# Patient Record
Sex: Male | Born: 1965 | Race: Black or African American | Hispanic: No | Marital: Single
Health system: Midwestern US, Community
[De-identification: ages and names within clinical notes are randomized; demographics above are authoritative.]

## PROBLEM LIST (undated history)

## (undated) DIAGNOSIS — N529 Male erectile dysfunction, unspecified: Secondary | ICD-10-CM

## (undated) DIAGNOSIS — Z720 Tobacco use: Secondary | ICD-10-CM

## (undated) DIAGNOSIS — J4520 Mild intermittent asthma, uncomplicated: Secondary | ICD-10-CM

## (undated) DIAGNOSIS — J45909 Unspecified asthma, uncomplicated: Secondary | ICD-10-CM

## (undated) DIAGNOSIS — I639 Cerebral infarction, unspecified: Secondary | ICD-10-CM

## (undated) HISTORY — PX: CARDIAC CATHETERIZATION: SHX172

---

## 2014-09-30 ENCOUNTER — Inpatient Hospital Stay: Admit: 2014-09-30 | Discharge: 2014-09-30 | Disposition: A | Discharge status: Home / Self Care

## 2014-09-30 NOTE — ED Provider Notes (Signed)
PATIENT:          Stephen Riley, Stephen Riley       DOS:           09/30/2014  MR #:             [id number]             ACCOUNT #:     [id number]  DATE OF BIRTH:    [birth year]              AGE:           49      HISTORY OF PRESENT ILLNESS:    PERTINENT HISTORY OF PRESENT  ILLNESS. Patient was seen with Dr.  Tasia Catchings.  Patient presents to the Emergency Department with complaint of right  upper  dental and facial pain.  Symptoms began 2 days ago progressively  worse.  This  feels like when he had a dental infection of the left side that  required  removal of his wisdom teeth area that was over a year ago and he has  not had  pain in the right upper teeth before.  He took Aleve last night with  very  minimal relief.  States he has pain with chewing.  He denies any  fevers,  chills, sweats.  Has not noticed any drainage from the site.        PHYSICAL EXAM Well-developed, well-nourished adult male.  He is  afebrile.  No  appreciable facial edema.  Tympanic membranes clear bilaterally.  ENT  exam  shows focal decay and erythema at the right maxillary third molar.  He  is  tender to palpation over the tooth.  There is erythema of the gumline  but no  appreciable dental abscess.  Neck is supple and nontender without  lymphadenopathy or meningismus.  Cardiac exam shows regular rate and  rhythm  without murmurs.  Lungs clear to auscultation.  Remainder of exam is  unremarkable other than noted above.    MEDICAL DECISION MAKING:    SIGNIFICANT FINDINGS/ED COURSE/MEDICAL DECISION MAKING/TREATMENT  PLAN Patient  is advised he appears to have periapical dental abscess secondary to  dental  caries.  Does not appear amenable to drainage at this time.  He is  given  Pen-Vee K as well as Dolobid.  He is given dental referral list and  encouraged  to call for follow-up as soon as possible.  He is instructed on signs  and  symptoms for return to the Emergency Department.  Discharged home with  pen VK  and naproxen.    PROBLEM LIST:       ED  Diagnosis:     Periapical abscess (K04.7): Entered Date: 30-Sep-2014 02:03,  Entered By:  Tressie Ellis, Status: Active, ICD-10: K20.7       Admit Reason:     tooth pain/head pain: Entered Date: 30-Sep-2014 01:32, Entered By:  Standley Brooking, Status: Active        ADDITIONAL INFORMATION If the physician assistant/nurse practitioner  was  involved in patient care, I personally performed and participated in  all the  above services (including HPI and PE). I have reviewed with the  physician  assistant/nurse practitioner the history and confirmed the findings  with the  patient. I personally performed all surgical procedures in the medical  record  unless otherwise indicated.      COPIES SENT TO::     JOPPERI, ANDREA(PCP): [id number]    Electronic Signatures:  AHMED, RAMI (DO)  (  Signed 08-Oct-2014 12:05)   Co-Signer: HISTORY OF PRESENT ILLNESS, PHYSICAL EXAM, MEDICAL  DECISION MAKING,  PROBLEM LIST, Additional Infomation, Copies to be sent to:  Tressie Ellis (PA)  (Signed 30-Sep-2014 02:04)   Authored: HISTORY OF PRESENT ILLNESS, PHYSICAL EXAM, MEDICAL DECISION  MAKING,  PROBLEM LIST, Additional Infomation, Copies to be sent to:      Last Updated: 08-Oct-2014 12:05 by AHMED, RAMI (DO)            Please see T-Sheet, initial assessment, and physician orders for  further details.    Dictating Physician: Tressie Ellis, PA  Original Electronic Signature Date: 09/30/2014 02:04 A  JS  Document #: [id number]    cc:  Dema Severin, DO       [address]       [address]       [place] [address]

## 2014-11-07 ENCOUNTER — Inpatient Hospital Stay: Admit: 2014-11-07 | Discharge: 2014-11-07 | Disposition: A | Discharge status: Home / Self Care

## 2014-11-07 LAB — CBC
Hematocrit: 41.8 % (ref 40.0–52.0)
Hemoglobin: 13.6 g/dL (ref 13.0–18.0)
MCH: 23.3 pg — ABNORMAL LOW (ref 26.0–34.0)
MCHC: 32.4 % (ref 32.0–36.0)
MCV: 71.7 fL — ABNORMAL LOW (ref 80.0–98.0)
MPV: 9.3 fL (ref 7.4–10.4)
Platelets: 257 10*3/uL (ref 140–440)
RBC: 5.83 10*6/uL (ref 4.40–5.90)
RDW: 16.4 % — ABNORMAL HIGH (ref 11.5–14.5)
WBC: 7 10*3/uL (ref 3.6–10.7)

## 2014-11-07 LAB — BASIC METABOLIC PANEL
Anion Gap: 6 NA
BUN: 16 mg/dL (ref 7–25)
CO2: 27 mmol/L (ref 21–32)
Calcium: 9 mg/dL (ref 8.2–10.1)
Chloride: 108 mmol/L (ref 98–109)
Creatinine: 1.07 mg/dL (ref 0.55–1.40)
EGFR IF NonAfrican American: >60 mL/min (ref >60)
Glucose: 98 mg/dL (ref 70–100)
Potassium: 4.3 mmol/L (ref 3.5–5.1)
Sodium: 141 mmol/L (ref 135–145)
eGFR African American: >60 mL/min (ref >60)

## 2014-11-07 LAB — TROPONIN: Troponin I: <0.015 ng/mL (ref 0.000–0.045)

## 2014-11-07 NOTE — ED Provider Notes (Signed)
PATIENT:          Stephen Riley, Stephen Riley       DOS:           11/07/2014  MR #:             [id number]             ACCOUNT #:     [id number]  DATE OF BIRTH:    [birth year]              AGE:           49      HISTORY OF PRESENT ILLNESS:    PERTINENT HISTORY OF PRESENT  ILLNESS. Patient was seen and examined  with Dr.  Earlene Plater. Patient is a 49 year old male presenting with shortness of  breath and  chest tightness for the past 5 hours. Patient reports he was eating  lunch at  onset of symptoms. Patient reports diaphoresis and palpitations at  onset of  symptoms. Patient denies nausea, vomiting or lightheadedness.    PERTINENT PAST/ FAMILY/SOCIAL HISTORY Asthma, 30-pack-year smoking        PHYSICAL EXAM Vital signs within normal limits  Constitutional:  Well developed, well nourished, no acute distress,  non-toxic  appearance  Eyes:  PERRL, conjunctiva normal  HENT:  Atraumatic, external ears normal, nose normal, oropharynx  moist. Neck-  normal range of motion, no tenderness, supple  Respiratory:  No respiratory distress, normal breath sounds, no rales,  no  wheezing  Cardiovascular:  Normal rate, normal rhythm, no murmurs, no gallops,  no rubs  GI:  Soft, nondistended, normal bowel sounds, nontender  GU:  No costovertebral angle tenderness  Musculoskeletal:  No edema, no tenderness, no deformities. Back- no  tenderness  Integument:  Well hydrated, no rash  Lymphatic:  No lymphadenopathy noted  Neurologic:  Alert  T  oriented x 3, normal motor function, normal  sensory  function, no focal deficits noted  Psychiatric:  Speech and behavior appropriate    MEDICAL DECISION MAKING:    SIGNIFICANT FINDINGS/ED COURSE/MEDICAL DECISION MAKING/TREATMENT  PLAN ECG,  CBC, BMP, chest xray and troponins all came back within normal limits.  Patient  was given a DuoNeb treatment in the ED. Patient reports relief of  chest  tightness after treatment. Patient has a HEART score of 2. Patient can  be  discharged without further cardiac  workup. Patient is agreeable to  plan.    PROBLEM LIST:       Admit Reason:     short of breath/CP: Entered Date: 07-Nov-2014 13:24, Entered By:  INTERFACES,  INTERFACES, Status: Active        DIAGNOSIS #1 EXACERBATION COPD  #2 CHEST TIGHTNESS SECONDARY TO #1  #3 NO EVIDENCE OF CARDIAC ISCHEMIA        ADDITIONAL INFORMATION The Emergency Medicine attending physician  was present  in the Emergency Department, who reviewed case management, and  approved  evaluation/treatment.      COPIES SENT TO::     JOPPERI, ANDREA(PCP): [id number]    Electronic Signatures:  Michel Harrow (MD)  (Signed 09-Nov-2014 07:31)   Authored: DIAGNOSIS   Co-Signer: HISTORY OF PRESENT ILLNESS, PHYSICAL EXAM, PROBLEM LIST,  Copies to  be sent to:  Hurman Horn (DO)  (Signed 07-Nov-2014 18:40)   Authored: HISTORY OF PRESENT ILLNESS, PHYSICAL EXAM, MEDICAL DECISION  MAKING,  PROBLEM LIST, DIAGNOSIS, Additional Infomation, Copies to be sent to:      Last Updated: 09-Nov-2014 07:31 by Michel Harrow (MD)  Please see T-Sheet, initial assessment, and physician orders for  further details.    Dictating Physician: Hurman Hornharles W Zayd Bonet, DO  Original Electronic Signature Date: 11/07/2014 04:21 P  CS  Document #: [id number]    cc:  Dema SeverinAndrea A Jopperi, DO       [address]       [address]       [place] [id number]

## 2014-11-25 ENCOUNTER — Inpatient Hospital Stay: Admit: 2014-11-25 | Discharge: 2014-11-26 | Disposition: A | Discharge status: Home / Self Care

## 2014-11-26 LAB — CBC
Hematocrit: 40.4 % (ref 40.0–52.0)
Hemoglobin: 12.7 g/dL — ABNORMAL LOW (ref 13.0–18.0)
MCH: 22.9 pg — ABNORMAL LOW (ref 26.0–34.0)
MCHC: 31.5 % — ABNORMAL LOW (ref 32.0–36.0)
MCV: 72.7 fL — ABNORMAL LOW (ref 80.0–98.0)
MPV: 8.6 fL (ref 7.4–10.4)
Platelets: 253 10*3/uL (ref 140–440)
RBC: 5.56 10*6/uL (ref 4.40–5.90)
RDW: 15.8 % — ABNORMAL HIGH (ref 11.5–14.5)
WBC: 7.5 10*3/uL (ref 3.6–10.7)

## 2014-11-26 LAB — BASIC METABOLIC PANEL
Anion Gap: 3 NA
BUN: 18 mg/dL (ref 7–25)
CO2: 31 mmol/L (ref 21–32)
Calcium: 8.6 mg/dL (ref 8.2–10.1)
Chloride: 107 mmol/L (ref 98–109)
Creatinine: 0.93 mg/dL (ref 0.55–1.40)
EGFR IF NonAfrican American: >60 mL/min (ref >60)
Glucose: 96 mg/dL (ref 70–100)
Potassium: 4.1 mmol/L (ref 3.5–5.1)
Sodium: 141 mmol/L (ref 135–145)
eGFR African American: >60 mL/min (ref >60)

## 2014-11-26 LAB — TROPONIN
Troponin I: <0.015 ng/mL (ref 0.000–0.045)
Troponin I: <0.015 ng/mL (ref 0.000–0.045)

## 2014-11-26 NOTE — ED Provider Notes (Signed)
PATIENT:          Stephen Riley, Stephen Riley       DOS:           11/25/2014  MR #:             [id number]             ACCOUNT #:     [id number]  DATE OF BIRTH:    06-Jul-1965              AGE:           49      HISTORY OF PRESENT ILLNESS:    PERTINENT HISTORY OF PRESENT  ILLNESS. Patient seen with Dr.  Ron Parker,  patient presents a chief complaint of substernal chest pain that was  originally  sharp in nature and morphed to becoming pressure-like sensation that  radiates  to his left jaw.  He has had multiple evaluations for chest pain  including 3  stress tests within the last 2 years all of which appeared negative.  This is  associated with shortness of breath.  He denies any fevers, abdominal  pain,  nausea or vomiting.  Patient was not doing any physical labor when the  chest  pain came on.  He did not take anything for it.    PERTINENT PAST/ FAMILY/SOCIAL HISTORY Chronic chest pain  negative cardiac cath in 2010        PHYSICAL EXAM Vitals: Within normal limits  Gen: Well nourished and well developed  Head: Normocephalic and atraumatic  Eyes: PERRL EOMI  Neck: Nontender  CVS: Regular, rate, rhythm, normal H0-Q6 no murmurs, clicks, rubs, or  gallops  Chest: No respiratory distress, clear to auscultation bilaterally  Abd: Soft, nontender, nondistended with normal bowel sounds.  Extremities: pulses 2+ bilaterally, nontender, atraumatic  Neuro: AAOx3 no focal deficits    MEDICAL DECISION MAKING:  Orders:     Aspirin Oral, tablet   324 mg PO with food once   Priority - STAT   Administer 4 - 81 mg tablets, 25-Nov-2014, Completed, 25-Nov-2014     Nitroglycerin Sublingual Oral,     0.4 mg sublingual Q5M STAT PRN Chest pain   Priority - STAT   Call MD if pain not relieved after 3 doses, 25-Nov-2014, Discontinued  via  Patient Discharge, 26-Nov-2014     Ketorolac Tromethamine Injection, injection    30 mg IV push once for 1 Times   Priority - Routine   -Maximum duration of therapy (including previous orders) is five days  per P  +  T Policy., 25-Nov-2014, Completed, 25-Nov-2014     Promethazine Injection, (25 mg = 1 ml) injection    12.5 mg IV push once   Priority - Routine   -Use large patent veins, Inject into the furthest port, Administer  slowly over  2 to 3 mins., Educate patients to report burning or pain during or  after  injection, 25-Nov-2014, Completed, 25-Nov-2014     Sodium Chloride 0.9%, 1,000 ml   Infuse at 20 ml/hr IV <Continuous>   Priority - STAT, 25-Nov-2014, Discontinued via Patient Discharge,  26-Nov-2014     Basic Metabolic Panel, STAT, [id number], 1 or more Final Results  Received     Hemogram, STAT, 25-Nov-2014, 1 or more Final Results Received     Troponin Level, STAT, 25-Nov-2014, 1 or more Final Results  Received     Troponin Level, Priority, 25-Nov-2014, Completed Activation     Troponin Level, Priority, 25-Nov-2014, 1  or more Final Results  Received     Chest 1 View, STAT Portable, 25-Nov-2014, 1 or more Final Results  Received     EKG, STAT for Chest Pain, 25-Nov-2014, 1 or more Final Results  Received     EKG, STAT for Chest Pain   12 Lead - OBTAIN WITHIN 10 MINUTES, 25-Nov-2014, Discontinued via  Patient  Discharge, 26-Nov-2014     EKG, STAT for Chest Pain   This is a **REPEAT** order, 25-Nov-2014, 1 or more Final Results  Received     Cardiac Monitor, <Continuous>, 25-Nov-2014, Discontinued via  Patient  Discharge, 26-Nov-2014     Old Charts to Floor, Include any EKGs, 25-Nov-2014, Discontinued  via Patient  Discharge, 26-Nov-2014     Oxygen Therapy-,  Use-Nasal Cannula Liter per min-2, 25-Nov-2014,  Discontinued via Patient Discharge, 26-Nov-2014    Pertinent Results.:  GENERAL:    25-Nov-2014 69:67, Basic Metabolic Panel    Sodium Level 141 [135 - 145 mmol/L]    Potassium Level 4.1 [3.5 - 5.1 mmol/L]    Chloride Level 107 [98 - 109 mmol/L]    Carbon Dioxide Level 31 [21 - 32 mmol/L]    Anion Gap 3    Glucose Level 96 [70 - 100 mg/dL]    Urea Nitrogen 18 [7 - 25 mg/dL]    Creatinine Level.Marland Kitchen 0.93 [0.55 -  1.40 mg/dL]    eGFR African American >60.0 [>60 mL/min]    eGFR Other >60.0 [>60 mL/min]  Source- MDRD equation with creatinine calibration to IDMS(NKDEP)   eGFR not recommended for drug dose adjustment    Calcium Level 8.6 [8.2 - 10.1 mg/dL]  HEMO:    25-Nov-2014 21:04, Hemogram    WBC 7.5 [3.6 - 10.7 10*3/uL]    RBC 5.56 [4.40 - 5.90 10*6/uL]    Hemoglobin   12.7 [13.0 - 18.0 g/dL]    Hematocrit 40.4 [40.0 - 52.0 %]    MCV   72.7 [80.0 - 98.0 fL]    MCH   22.9 [26.0 - 34.0 pg]    MCHC   31.5 [32.0 - 36.0 %]    MPV 8.6 [7.4 - 10.4 fL]    RDW   15.8 [11.5 - 14.5 %]    Platelet 253 [140 - 440 10*3/uL]      SIGNIFICANT FINDINGS/ED COURSE/MEDICAL DECISION MAKING/TREATMENT  PLAN Patient  remained hemodynamically stable.  Electrocardiogram is unremarkable  for any  acute ischemia patterns.  It is also unchanged from priors.  Cardiac  workup is  unremarkable including chest x-ray, troponin and complete blood count  with BMP.   Repeat troponin is drawn and if abnormal be commented on.  Otherwise  patient  will be discharged in stable condition with instruction to follow-up  with  primary care provider.    PROBLEM LIST:       ED Diagnosis:     Chest pain (R07.9): Entered Date: 26-Nov-2014 00:18, Entered By:  Nicoletta Dress A, Status: Active, ICD-10: R07.9        ADDITIONAL INFORMATION The Emergency Medicine attending physician  was present  in the Emergency Department, who reviewed case management, and  approved  evaluation/treatment.      COPIES SENT TO::     JOPPERI, ANDREA(PCP): [id number]    Electronic Signatures:  Nicoletta Dress A (MD)  (Signed 26-Nov-2014 00:18)   Authored: HISTORY OF PRESENT ILLNESS, PHYSICAL EXAM, MEDICAL DECISION  MAKING,  PROBLEM LIST, Additional Infomation, Copies to be sent to:  OBEIUS, MARCUS (DO)  (Signed 13-Dec-2014  02:82)   Authored: MEDICAL DECISION MAKING   Co-Signer: HISTORY OF PRESENT ILLNESS, PHYSICAL EXAM, MEDICAL  DECISION MAKING,  PROBLEM LIST, Additional Infomation, Copies to be sent  to:      Last Updated: 13-Dec-2014 02:44 by Ron Parker, MARCUS (DO)            Please see T-Sheet, initial assessment, and physician orders for  further details.    Dictating Physician: Janelle Floor, MD  Original Electronic Signature Date: 11/26/2014 12:18 A  JAG  Document #: [id number]    cc:  Hubbard Robinson, DO       [address]       [place]       [place] [address]

## 2014-12-26 ENCOUNTER — Inpatient Hospital Stay: Admit: 2014-12-26 | Discharge: 2014-12-27 | Disposition: A | Discharge status: Home / Self Care

## 2014-12-26 NOTE — ED Provider Notes (Signed)
PATIENT:          Stephen Riley, Stephen Riley       DOS:           12/26/2014  MR #:             [id number]             ACCOUNT #:     [id number]  DATE OF BIRTH:    Jul 02, 1965              AGE:           49      HISTORY OF PRESENT ILLNESS:    PERTINENT HISTORY OF PRESENT  ILLNESS. Patient seen under the  supervision of  Dr. Remigio Eisenmenger.  Patient presents the emergency Department with complaints  of an  intermittent left posterior headache  1 month.  Patient states that  last  approximately 30 minutes.  He admits to an 8 out of 10 posterior  throbbing pain  but denies any other associated symptoms.  He notes his pain is  relieved with  ibuprofen and rest.  He does note his neck is tender to time.  He  denies any  injury or trauma.    PERTINENT PAST/ FAMILY/SOCIAL HISTORY PMH: Asthma  PSH: Heart cath (no stents)  PCP: None  patient admits to tobacco use        PHYSICAL EXAM Vital signs within normal limits  Gen: Well-nourished well-developed male in no acute distress  HEENT: NCAT, nontender, PERRL, EOMI  Neck: Supple, left lateral tenderness to palpation  CVS: Regular rate and rhythm, no murmurs  Resp: No acute distress, CTA b/l, chest nontender  Abd: Soft, nontender, nondistended, normal bowel sounds  Ext: Nontender, no edema, AFROM x4 extremities, distal sensation  intact with  good capillary refill  4 extremities  Neuro: Alert and oriented  3, normal strength and sensation    MEDICAL DECISION MAKING:    SIGNIFICANT FINDINGS/ED COURSE/MEDICAL DECISION MAKING/TREATMENT  PLAN Patient  has both a headache and neck pain.  No trauma.  No fever.  No  meningeal  findings.  There is nothing to indicate that imaging is necessary.  We  will  treat for a headache, and this will likely help with his neck pain.  He  received a migraine cocktail.  His headache symptoms and neck pain  symptoms  improved.  Patient will be discharged.    PROBLEM LIST:         ED Diagnosis:     Headache (R51): Entered Date: 26-Dec-2014 23:53, Entered By:  Oscar La,  Status: Active, ICD-10: R47        ADDITIONAL INFORMATION If the physician assistant/nurse practitioner  was  involved in patient care, I personally performed and participated in  all the  above services (including HPI and PE). I have reviewed with the  physician  assistant/nurse practitioner the history and confirmed the findings  with the  patient. I personally performed all surgical procedures in the medical  record  unless otherwise indicated.      This report was generated using a computer driven dictation system.  This  system often results in transcription errors.  Every effort is made to  identify  and correct errors, however errors may still occur.      COPIES SENT TO::     JOPPERI, ANDREA(PCP): [id number]    Electronic Signatures:  Oscar La (MD)  (Signed 26-Dec-2014 23:53)   Authored: MEDICAL DECISION MAKING, PROBLEM LIST  Co-Signer: HISTORY OF PRESENT ILLNESS, PHYSICAL EXAM, PROBLEM LIST,  Additional  Infomation, Copies to be sent to:  Rushie Chestnut A (PA)  (Signed 26-Dec-2014 20:35)   Authored: HISTORY OF PRESENT ILLNESS, PHYSICAL EXAM, PROBLEM LIST,  Additional  Infomation, Copies to be sent to:      Last Updated: 26-Dec-2014 23:53 by Oscar La (MD)            Please see T-Sheet, initial assessment, and physician orders for  further details.    Dictating Physician: Rayburn Felt, PA-C  Original Electronic Signature Date: 12/26/2014 08:35 P  LAW  Document #: [id number]    cc:  Dema Severin, DO       [address]       [address]       [place] [address]         PCP No       [place]

## 2015-03-15 ENCOUNTER — Inpatient Hospital Stay: Admit: 2015-03-15 | Discharge: 2015-03-16 | Disposition: A | Discharge status: Home / Self Care

## 2015-03-15 NOTE — ED Provider Notes (Signed)
PATIENT:          Stephen Riley, Stephen Riley       DOS:           03/15/2015  MR #:             [id number]             ACCOUNT #:     [id number]  DATE OF BIRTH:    Dec 27, 1965              AGE:           49      HISTORY OF PRESENT ILLNESS:    PERTINENT HISTORY OF PRESENT  ILLNESS. Patient seen and evaluated  with Dr.  Valarie Cones. He presents emergency department for complaint of neck pain for  the last  3 days. He denies any injury. He also complains of right-sided throat  pain.  Denies any nausea, vomiting, chest pain or shortness of breath.  Complains of  mild cough.    PERTINENT PAST/ FAMILY/SOCIAL HISTORY Transient ischemic attack,  bulging disc        PHYSICAL EXAM Patient is alert and oriented times 3 with normal  vital signs.  Head is atraumatic. Pupils equal round reactive to light. Normal  examination of  nose and throat. No evidence of abscess. Neck is supple. +cervical  lymphadenopathy.  No meningismus. Full range of motion. Mild  tenderness to  palpation along paraspinal regions. No midline tenderness. Heart rate  and  rhythm regular. Lungs clear throughout. No distress. Skin normal in  color  without rashes. Remainder of exam unremarkable.    MEDICAL DECISION MAKING:    SIGNIFICANT FINDINGS/ED COURSE/MEDICAL DECISION MAKING/TREATMENT  PLAN Patient  is well-appearing and in no acute distress. He has stable vital signs.  Patient  does complain of mild cough with neck pain. He also complains of  throat pain.  There is no evidence of abscess. No meningeal signs. Patient does have  cervical  adenopathy. I feel this is likely a viral pharyngitis. Patient will  take  nonsteroidal anti-inflammatory drugs. He is discharged home in stable  condition.    PROBLEM LIST:        DIAGNOSIS 1. Viral pharyngitis  2. Cervical adenopathy        ADDITIONAL INFORMATION If the physician assistant/nurse practitioner  was  involved in patient care, I personally performed and participated in  all the  above services (including HPI and PE).  I have reviewed with the  physician  assistant/nurse practitioner the history and confirmed the findings  with the  patient. I personally performed all surgical procedures in the medical  record  unless otherwise indicated.      COPIES SENT TO::     NO, PCP DOCTOR(PCP): [id number]    Electronic Signatures:  Joellyn Haff (NP)  (Signed 15-Mar-2015 20:42)   Authored: HISTORY OF PRESENT ILLNESS, PHYSICAL EXAM, MEDICAL DECISION  MAKING,  PROBLEM LIST, DIAGNOSIS, Additional Infomation, Copies to be sent to:  Carnella Guadalajara (DO)  (Signed 19-Mar-2015 19:53)   Authored: PROBLEM LIST   Co-Signer: HISTORY OF PRESENT ILLNESS, PHYSICAL EXAM, PROBLEM LIST,  Additional  Infomation, Copies to be sent to:      Last Updated: 19-Mar-2015 19:53 by Joylene Draft R (DO)            Please see T-Sheet, initial assessment, and physician orders for  further details.    Dictating Physician: Joellyn Haff, CNP  Original Electronic Signature Date: 03/15/2015 08:09  P  [facility]  Document #: [id number]    cc:  PCP No       [place]

## 2015-04-10 ENCOUNTER — Inpatient Hospital Stay: Admit: 2015-04-10 | Discharge: 2015-04-10 | Disposition: A | Discharge status: Home / Self Care

## 2015-04-10 NOTE — ED Provider Notes (Signed)
PATIENT:          Stephen Riley, Stephen Riley       DOS:           04/10/2015  MR #:             [id number]             ACCOUNT #:     [id number]  DATE OF BIRTH:    [birth year]              AGE:           49      HISTORY OF PRESENT ILLNESS:    PERTINENT HISTORY OF PRESENT  ILLNESS. The patient was seen under  the  supervision of Dr. Jesus GeneraPeter Listerman.  This is a 49 year old male who  presents  with ankle pain.  He states 2 months ago while playing basketball he  turned and  twisted his foot.  He has had pain in his ankle since then.  It is  worse when  he ambulates.  He presents today because over the past few nights it  has been  keeping him up at night.  He does have intermittent paresthesias in  his fourth  and fifth toes.  He denies weakness or loss of function distal to the  injury.  He does not have any other complaints at this time.    PERTINENT PAST/ FAMILY/SOCIAL HISTORY PMH: Borderline diabetes,  asthma  PSH: Hernia repair  Social HX: Admits to tobacco use.  Denies alcohol or illicit drug use        PHYSICAL EXAM Vital signs: Within except for normal limits  This is a well-appearing male sitting in no acute distress.  HEENT  exam is  unremarkable.  Regular rate and rhythm on cardiac exam.  Lungs are  clear to  auscultation bilaterally.  Abdomen is soft normal bowel sounds.  He  has  tenderness over the right ankle just inferior to the lateral malleoli  and over  the base of the fifth metatarsal.  There is no swelling or palpable  deformity.  He has +5/5 strength with dorsiflexion and plantar flexion.  No  sensory  deficits distally.  +2 dorsal pedal and posterior tibial pulses  bilaterally.  Remainder of exam is unremarkable.    MEDICAL DECISION MAKING:    SIGNIFICANT FINDINGS/ED COURSE/MEDICAL DECISION MAKING/TREATMENT  PLAN He was  given an ice pack and she with Naprosyn for his pain.  X-rays were  obtained of  his right ankle and foot and there was no evidence of bony  abnormality  including old fracture.  The  patient was informed of these findings.  He was  placed in a postoperative shoe for comfort.  He will continue to rest,  ice and  elevate the extremity as needed.  He will be discharged home and  referred to  orthopedics for follow-up.    PROBLEM LIST:       ED Diagnosis:     Foot pain (M79.673): Entered Date: 10-Apr-2015 16:25, Entered By:  Sanjuan DameMARCHAK,  Michah Minton G, Status: Active, ICD-10: V40.981: M79.673     Right ankle pain (M25.571): Entered Date: 10-Apr-2015 16:24,  Entered By:  Sanjuan DameMARCHAK, Roseline Ebarb G, Status: Active, ICD-10: M25.571       Admit Reason:     rt foot pain: Entered Date: 10-Apr-2015 13:16, Entered By:  Standley BrookingINTERFACES,  INTERFACES, Status: Active        ADDITIONAL INFORMATION If the physician assistant/nurse practitioner  was  involved  in patient care, I personally performed and participated in  all the  above services (including HPI and PE). I have reviewed with the  physician  assistant/nurse practitioner the history and confirmed the findings  with the  patient. I personally performed all surgical procedures in the medical  record  unless otherwise indicated.      COPIES SENT TO::     NO, PCP DOCTOR(PCP): [id number]  Electronic Signatures:  Jesus Genera (MD)  (Signed 10-Apr-2015 16:44)   Co-Signer: HISTORY OF PRESENT ILLNESS, PHYSICAL EXAM, MEDICAL  DECISION MAKING,  PROBLEM LIST, Additional Infomation, Copies to be sent to:  Sanjuan Dame (PA)  (Signed 10-Apr-2015 16:25)   Authored: HISTORY OF PRESENT ILLNESS, PHYSICAL EXAM, MEDICAL DECISION  MAKING,  PROBLEM LIST, Additional Infomation, Copies to be sent to:      Last Updated: 10-Apr-2015 16:44 by Jesus Genera (MD)            Please see T-Sheet, initial assessment, and physician orders for  further details.    Dictating Physician: Sanjuan Dame, PA  Original Electronic Signature Date: 04/10/2015 03:06 P  [facility]  Document #: [id number]    cc:  PCP No       [place]

## 2015-04-29 NOTE — ED Provider Notes (Signed)
PATIENT:          Stephen Riley, Stephen Riley       DOS:           04/29/2015  MR #:             [id number]             ACCOUNT #:     [id number]  DATE OF BIRTH:    1966/03/11              AGE:           49      HISTORY OF PRESENT ILLNESS:    PERTINENT HISTORY OF PRESENT  ILLNESS. He reports a three-day  history of  cough with occasional sputum production.  He complains of body aches,  postnasal  drip, and an occasional sore throat. He reports he had a fever last  evening  with chills. He reports having a mild headache.  He reports occasional  chest  pain with coughing. Denies any at rest. Denies exertional dyspnea.    Review of systems: The patient denies shortness of breath, abdominal  pain,  vomiting, diarrhea, numbness, weakness, rash.  Otherwise 10 systems  have been  reviewed and are negative except as per the history of present  illness.    Past medical history: RN documentation reviewed by me  Social history: RN documentation reviewed by me  Nurses notes reviewed until disposition time    PERTINENT PAST/ FAMILY/SOCIAL HISTORY Asthma, prediabetes, smoker  of  cigarettes        PHYSICAL EXAM PHYSICAL EXAMINATION  GENERAL APPEARANCE: This patient is in no acute respiratory distress.  Awake and  alert.  VITAL SIGNS: As per the nurses' triage record.  HEENT: Normocephalic, atraumatic. Extraocular muscles are intact.  Pupils equal  round and reactive to light. Conjunctiva are pink. Negative scleral  icterus.  Mucous membranes are moist. Tongue in the midline. Pharynx was without  erythema  or exudates.  NECK: Nontender and supple.  CHEST: Nontender to palpation. Clear to auscultation bilaterally. No  rales,  rhonchi, or wheezing.  HEART: S1, S2. Regular rate and rhythm. No murmurs, gallops or rubs.  Strong and  equal pulses in the extremities.  ABDOMEN: Soft, nontender, nondistended, positive bowel sounds, no  palpable  masses.  MUSCULOSKELETAL: The calves are nontender to palpation. Active range  of  motion.    NEUROLOGICAL: Awake, alert and oriented x 3. Power intact in the upper  and  lower extremities. Sensation is intact to light touch in the upper and  lower  extremities.  IMMUNOLOGICAL: No palpable lymphadenopathy or lymphatic streaking.  DERM: No petechiae, rashes, or ecchymoses.    Presentation likely related to viral syndrome, but he also may have  influenza.  I will order a chest x-ray to exclude pneumonia. This does not sound  like acute  coronary syndrome.    MEDICAL DECISION MAKING:  Other Results:  DIAG IMAGING:    29-Apr-2015 15:21, Chest Pa + Lateral    Chest Pa + Lateral  Final...                  Patient Name:  Stephen Riley, Stephen Riley        MRN:  [id number]        FIN:  [id number]                                    ***  Diagnostic Radiology***          Exam Date/Time        04/29/2015 15:00:44 EST        Exam                  CR Chest PA/LAT          Ordering Physician    Braulio Bosch, MD, Annalaya Wile          Accession Number      [id number]          CPT4 Codes        71020 ()            Reason For Exam        Cough            Report        CHEST, PA  T  LATERAL:          INDICATION:  Cough          COMPARISON:  11/25/2014          PA and lateral views of the chest were obtained.  The heart is  normal        in size.  The mediastinal silhouette is normal.  The lungs are        hyperinflated with flattening of the hemidiaphragms and increase  in        the retrosternal airspace.  There are no effusions or  infiltrates.        There is biapical pleural thickening.  Arthritic changes of the  spine        and shoulders are present.          IMPRESSION:  Hyperinflation. No acute process.          Report Dictated on Workstation: [id number]        ***** Final *****          Dictated: 04/29/2015 3:21 pm        Dictating Physician: Glenna Durand, ALFRED        Signed Date and Time: 04/29/2015 3:21 pm        Signed by: Glenna Durand, ALFRED        Transcribed Date and Time: 04/29/2015 3:21      SIGNIFICANT FINDINGS/ED  COURSE/MEDICAL DECISION MAKING/TREATMENT  PLAN As his  chest x-ray is negative, I will treat him presumptively for influenza  with  Tamiflu secondary to his risk factors of asthma and prediabetes.    I estimate there is LOW risk for PERICARDIAL TAMPONADE, PNEUMOTHORAX,  PULMONARY  EMBOLISM, ACUTE CORONARY SYNDROME, OR THORACIC AORTIC DISSECTION, thus  I  consider the discharge disposition reasonable. We have discussed the  diagnosis  and risks, and we agree with discharging home to follow-up with their  primary  doctor. We also discussed returning to the Emergency Department  immediately if  new or worsening symptoms occur. We have discussed the symptoms which  are most  concerning (e.g., bloody sputum, fever, worsening pain or shortness of  breath,  vomiting) that necessitate immediate return.    PROBLEM LIST:       Admit Reason:     headache / aches / chills: Entered Date: 29-Apr-2015 13:18, Entered  By:  Standley Brooking, Status: Active        DIAGNOSIS 1. Viral syndrome  2. Possible influenza      COPIES SENT TO::     NO, PCP DOCTOR(PCP): [id number]    Electronic Signatures:  Jaxxen Voong,  Damaso Laday (MD)  (Signed 29-Apr-2015 15:46)   Authored: HISTORY OF PRESENT ILLNESS, PHYSICAL EXAM, MEDICAL DECISION  MAKING,  PROBLEM LIST, DIAGNOSIS, Copies to be sent to:      Last Updated: 29-Apr-2015 15:46 by Earley FavorJENIS, Ripley Bogosian (MD)            Please see T-Sheet, initial assessment, and physician orders for  further details.    Dictating Physician: Earley FavorAndrew La Shehan, MD  Original Electronic Signature Date: 04/29/2015 02:19 P  AJ  Document #: [id number]    cc:  PCP No       Soarian

## 2015-11-01 NOTE — Progress Notes (Signed)
Called number on file and a woman answered stating "Chandon does not live here"    Appt - 11/03/15

## 2015-11-03 ENCOUNTER — Ambulatory Visit
Admit: 2015-11-03 | Discharge: 2015-11-03 | Payer: PRIVATE HEALTH INSURANCE | Attending: Internal Medicine | Primary: Internal Medicine

## 2015-11-03 DIAGNOSIS — R7303 Prediabetes: Secondary | ICD-10-CM

## 2015-11-03 LAB — COMPREHENSIVE METABOLIC PANEL
ALT: 38 U/L (ref 12–78)
AST: 19 U/L (ref 15–37)
Albumin,Serum: 3.9 g/dL (ref 3.4–5.0)
Alkaline Phosphatase: 104 U/L (ref 45–117)
Anion Gap: 8 NA
BUN: 18 mg/dL (ref 7–25)
CO2: 26 mmol/L (ref 21–32)
Calcium: 9 mg/dL (ref 8.2–10.1)
Chloride: 109 mmol/L (ref 98–109)
Creatinine: 0.85 mg/dL (ref 0.55–1.40)
EGFR IF NonAfrican American: >60 mL/min (ref >60)
Glucose: 87 mg/dL (ref 70–100)
Potassium: 4.5 mmol/L (ref 3.5–5.1)
Sodium: 143 mmol/L (ref 135–145)
Total Bilirubin: 0.5 mg/dL (ref 0.2–1.0)
Total Protein: 7.5 g/dL (ref 6.4–8.2)
eGFR African American: >60 mL/min (ref >60)

## 2015-11-03 LAB — CBC
Hematocrit: 41.5 % (ref 40.0–52.0)
Hemoglobin: 13.2 g/dL (ref 13.0–18.0)
MCH: 23.2 pg — ABNORMAL LOW (ref 26.0–34.0)
MCHC: 31.8 % — ABNORMAL LOW (ref 32.0–36.0)
MCV: 73.1 fL — ABNORMAL LOW (ref 80.0–98.0)
MPV: 10.6 fL — ABNORMAL HIGH (ref 7.4–10.4)
Platelets: 240 10*3/uL (ref 140–440)
RBC: 5.68 10*6/uL (ref 4.40–5.90)
RDW: 16.6 % — ABNORMAL HIGH (ref 11.5–14.5)
WBC: 5.3 10*3/uL (ref 3.6–10.7)

## 2015-11-03 LAB — HEPATITIS C ANTIBODY: Hepatitis C Ab: NOT DETECTED NA

## 2015-11-03 LAB — HIV SCREEN: HIV 1+2 AB+HIV1P24 AG, EIA: NONREACTIVE NA

## 2015-11-03 MED ORDER — SILDENAFIL CITRATE 50 MG PO TABS
50 | ORAL_TABLET | ORAL | 3 refills | Status: DC | PRN
Start: 2015-11-03 — End: 2015-11-08

## 2015-11-03 MED ORDER — ALBUTEROL SULFATE HFA 108 (90 BASE) MCG/ACT IN AERS
108 (90 Base) MCG/ACT | Freq: Four times a day (QID) | RESPIRATORY_TRACT | 3 refills | Status: DC | PRN
Start: 2015-11-03 — End: 2015-11-08

## 2015-11-03 NOTE — Progress Notes (Signed)
Patient came to appt today

## 2015-11-03 NOTE — Progress Notes (Signed)
Subjective:     Patient: Stephen Riley is a 50 y.o. male    HPI Comments: Stephen Riley comes for anew patient visit. Previosuly saw Dr. Nehemiah Massed but had to change for insurance reasons.     He was told by Dr. Nehemiah Massed that he is prediabetic. Would like repeat testing. He has had some polydipsia. No polyuria.     He has been told he has asthma and uses albuterol PRN about once per day over the last week. He has had PFTs but it has been a long time. He does smoke about 1/2 ppd and is trying to quit but has had some some stressors and started back up after having quit for 30 days. He has tried nicotine replacement that wasn't very helpful. Has tried Chanti, which was somewhat helpful but then resumed smoking.     Denies any history of HTN. BP a little high today, he did use ibuprofen 600 mg last night.     He has had ED for a couple years, though he does get spontaneous erections sometimes. He has tried Viagra and had success with it when he was given samples.     He was to get a colonoscopy some time ago for screening but was never contacted to schedule.        Review of Systems   Constitutional: Negative for activity change, chills, fever and unexpected weight change.   Respiratory: Negative for cough, chest tightness, shortness of breath and wheezing.    Cardiovascular: Negative for chest pain, palpitations and leg swelling.   Gastrointestinal: Negative for abdominal distention, abdominal pain, blood in stool, constipation, diarrhea, nausea and vomiting.   Endocrine: Positive for polydipsia. Negative for cold intolerance, heat intolerance and polyuria.   Genitourinary: Negative for dysuria and frequency.   Musculoskeletal: Positive for myalgias.   Skin: Negative for rash.   Neurological: Negative for dizziness and headaches.   Psychiatric/Behavioral: Negative for dysphoric mood. The patient is not nervous/anxious.         No Known Allergies  No current outpatient prescriptions on file prior to visit.     No current  facility-administered medications on file prior to visit.       Patient Active Problem List   Diagnosis   ??? GERD (gastroesophageal reflux disease)      Social History   Substance Use Topics   ??? Smoking status: Current Every Day Smoker     Types: Cigarettes     Start date: 11/02/1985   ??? Smokeless tobacco: Never Used   ??? Alcohol use No      Family History   Problem Relation Age of Onset   ??? Asthma Mother    ??? No Known Problems Son    ??? No Known Problems Daughter          Objective:     BP (!) 148/83 (Site: Right Arm, Position: Sitting, Cuff Size: Large Adult)   Pulse 82   Temp 98.4 ??F (36.9 ??C) (Oral)    Ht  (1.905 m)   Wt 224 lb 9.6 oz (101.9 kg)   SpO2 98%   BMI 28.07 kg/m2    Physical Exam   Constitutional: He is oriented to person, place, and time. He appears well-developed and well-nourished. No distress.   HENT:   Head: Normocephalic and atraumatic.   Nose: Nose normal.   Mouth/Throat: Oropharynx is clear and moist.   Eyes: Conjunctivae are normal. Pupils are equal, round, and reactive to light. Right eye exhibits  no discharge. Left eye exhibits no discharge. No scleral icterus.   Neck: No tracheal deviation present. No thyromegaly present.   Cardiovascular: Normal rate, regular rhythm and intact distal pulses.  Exam reveals no gallop.    No murmur heard.  Pulmonary/Chest: Effort normal and breath sounds normal. No respiratory distress. He has no wheezes. He has no rales.   Abdominal: Soft. Bowel sounds are normal. He exhibits no distension and no mass. There is no tenderness. There is no rebound and no guarding.   No hepatosplenomegaly   Musculoskeletal: Normal range of motion. He exhibits no edema or tenderness.   No lower extremity tenderness bilaterally   Lymphadenopathy:     He has no cervical adenopathy.   Neurological: He is alert and oriented to person, place, and time.   Skin: Skin is warm and dry.   Psychiatric: He has a normal mood and affect. His behavior is normal. Thought content normal.        Assessment/Plan      1. Prediabetes  - Comprehensive Metabolic Panel; Future  - Lipid Panel; Future  - Hemoglobin A1C; Future  Info provided on dietary intervention.     2. Elevated blood pressure (not hypertension)  - CBC; Future  - Comprehensive Metabolic Panel; Future  Will monitor for now and reassess when I see him in 3 months. No prior history of HTN. Recent NSAID use may also be contributing.     3. Vasculogenic erectile dysfunction, unspecified vasculogenic erectile dysfunction type  - sildenafil (VIAGRA) 50 MG tablet; Take 1 tablet by mouth as needed for Erectile Dysfunction  Dispense: 10 tablet; Refill: 3  Cost may be prohibitive but order sent.     4. Tobacco abuse  Discussed cessation. He is motivated ad wants to attempt quitting without medication assistance.     5. Mild persistent asthma without complication  He is happy with PRN albuterol use. If symptoms worse or albuterol use increases, then we will check PFTs.     6. Gastroesophageal reflux disease without esophagitis  No medication for this currently. He has [facility] that as ultimately determined to be due to GERD most likely.     7. Colon cancer screening  - [facility] - Hayden RasmussenGanesh Veerappan, MD    8. Need for hepatitis C screening test  - Hepatitis C Antibody; Future    9. Screening for HIV (human immunodeficiency virus)  - HIV Screen; Future    10. Screening for lipid disorders  - Lipid Panel; Future        Argie Ramminghristopher L Samaira Holzworth, DO  11/03/15  9:42 AM

## 2015-11-03 NOTE — Patient Instructions (Addendum)
Learning About Diabetes Food Guidelines  Your Care Instructions  Meal planning is important to manage diabetes. It helps keep your blood sugar at a target level (which you set with your doctor). You don't have to eat special foods. You can eat what your family eats, including sweets once in a while. But you do have to pay attention to how often you eat and how much you eat of certain foods.  You may want to work with a dietitian or a certified diabetes educator (CDE) to help you plan meals and snacks. A dietitian or CDE can also help you lose weight if that is one of your goals.  What should you know about eating carbs?  Managing the amount of carbohydrate (carbs) you eat is an important part of healthy meals when you have diabetes. Carbohydrate is found in many foods.  ?? Learn which foods have carbs. And learn the amounts of carbs in different foods.  ?? Bread, cereal, pasta, and rice have about 15 grams of carbs in a serving. A serving is 1 slice of bread (1 ounce), ?? cup of cooked cereal, or 1/3 cup of cooked pasta or rice.  ?? Fruits have 15 grams of carbs in a serving. A serving is 1 small fresh fruit, such as an apple or orange; ?? of a banana; ?? cup of cooked or canned fruit; ?? cup of fruit juice; 1 cup of melon or raspberries; or 2 tablespoons of dried fruit.  ?? Milk and no-sugar-added yogurt have 15 grams of carbs in a serving. A serving is 1 cup of milk or 2/3 cup of no-sugar-added yogurt.  ?? Starchy vegetables have 15 grams of carbs in a serving. A serving is ?? cup of mashed potatoes or sweet potato; 1 cup winter squash; ?? of a small baked potato; ?? cup of cooked beans; or ?? cup cooked corn or green peas.  ?? Learn how much carbs to eat each day and at each meal. A dietitian or CDE can teach you how to keep track of the amount of carbs you eat. This is called carbohydrate counting.  ?? If you are not sure how to count carbohydrate grams, use the Plate Method to plan meals. It is a good, quick way to make  sure that you have a balanced meal. It also helps you spread carbs throughout the day.  ?? Divide your plate by types of foods. Put non-starchy vegetables on half the plate, meat or other protein food on one-quarter of the plate, and a grain or starchy vegetable in the final quarter of the plate. To this you can add a small piece of fruit and 1 cup of milk or yogurt, depending on how many carbs you are supposed to eat at a meal.  ?? Try to eat about the same amount of carbs at each meal. Do not "save up" your daily allowance of carbs to eat at one meal.  ?? Proteins have very little or no carbs per serving. Examples of proteins are beef, chicken, turkey, fish, eggs, tofu, cheese, cottage cheese, and peanut butter. A serving size of meat is 3 ounces, which is about the size of a deck of cards. Examples of meat substitute serving sizes (equal to 1 ounce of meat) are 1/4 cup of cottage cheese, 1 egg, 1 tablespoon of peanut butter, and ?? cup of tofu.  How can you eat out and still eat healthy?  ?? Learn to estimate the serving sizes of foods that have   carbohydrate. If you measure food at home, it will be easier to estimate the amount in a serving of restaurant food.  ?? If the meal you order has too much carbohydrate (such as potatoes, corn, or baked beans), ask to have a low-carbohydrate food instead. Ask for a salad or green vegetables.  ?? If you use insulin, check your blood sugar before and after eating out to help you plan how much to eat in the future.  ?? If you eat more carbohydrate at a meal than you had planned, take a walk or do other exercise. This will help lower your blood sugar.  What else should you know?  ?? Limit saturated fat, such as the fat from meat and dairy products. This is a healthy choice because people who have diabetes are at higher risk of heart disease. So choose lean cuts of meat and nonfat or low-fat dairy products. Use olive or canola oil instead of butter or shortening when cooking.  ?? Don't  skip meals. Your blood sugar may drop too low if you skip meals and take insulin or certain medicines for diabetes.  ?? Check with your doctor before you drink alcohol. Alcohol can cause your blood sugar to drop too low. Alcohol can also cause a bad reaction if you take certain diabetes medicines.  Follow-up care is a key part of your treatment and safety. Be sure to make and go to all appointments, and call your doctor if you are having problems. It's also a good idea to know your test results and keep a list of the medicines you take.  Where can you learn more?  Go to [email or web address] and sign in to your MyChart account. Enter I147 in the Search Health Information box to learn more about "Learning About Diabetes Food Guidelines."     If you do not have an account, please click on the "Sign Up Now" link.  Current as of: Sep 05, 2014  Content Version: 11.2  ?? 2006-2017 [employer]. Care instructions adapted under license by [facility]. If you have questions about a medical condition or this instruction, always ask your healthcare professional. Healthwise, Inc[employer] disclaims any warranty or liability for your use of this information.

## 2015-11-03 NOTE — Progress Notes (Signed)
New patient. Establish care. Has not had a colonoscopy.

## 2015-11-04 LAB — LIPID PANEL
Chol/HDL Ratio: 4 NA
Cholesterol: 191 mg/dL (ref <200)
HDL: 52 mg/dL (ref 40–59)
LDL Cholesterol: 115 mg/dL — AB (ref <100)
Triglycerides: 118 mg/dL (ref <150)

## 2015-11-04 LAB — HEMOGLOBIN A1C
Hemoglobin A1C: 5.7 % — ABNORMAL HIGH (ref 4.0–5.6)
eAG: 117 mg/dL

## 2015-11-08 ENCOUNTER — Encounter

## 2015-11-08 MED ORDER — ALBUTEROL SULFATE HFA 108 (90 BASE) MCG/ACT IN AERS
108 (90 Base) MCG/ACT | Freq: Four times a day (QID) | RESPIRATORY_TRACT | 3 refills | Status: DC | PRN
Start: 2015-11-08 — End: 2016-06-14

## 2015-11-08 MED ORDER — SILDENAFIL CITRATE 50 MG PO TABS
50 | ORAL_TABLET | ORAL | 3 refills | Status: DC | PRN
Start: 2015-11-08 — End: 2015-11-27

## 2015-11-08 NOTE — Telephone Encounter (Addendum)
Pt states he changed his pharmacy and wants all of his medications to [facility] on [address]

## 2015-11-27 ENCOUNTER — Inpatient Hospital Stay
Admit: 2015-11-27 | Discharge: 2015-11-27 | Disposition: A | Discharge status: Home / Self Care | Payer: PRIVATE HEALTH INSURANCE | Attending: Emergency Medicine

## 2015-11-27 DIAGNOSIS — R0789 Other chest pain: Secondary | ICD-10-CM

## 2015-11-27 LAB — CBC WITH AUTO DIFFERENTIAL
Absolute Baso #: 0 10*3/uL (ref 0.0–0.2)
Absolute Eos #: 0.1 10*3/uL (ref 0.0–0.5)
Absolute Lymph #: 2.3 10*3/uL (ref 1.0–4.3)
Absolute Mono #: 0.5 10*3/uL (ref 0.0–0.8)
Absolute Neut #: 4.5 10*3/uL (ref 1.8–7.0)
Basophils: 0.5 %
Eosinophils: 1.9 %
Granulocytes %: 60.5 %
Hematocrit: 42.7 % (ref 40.0–52.0)
Hemoglobin: 13.9 g/dL (ref 13.0–18.0)
Lymphocyte %: 30.5 %
MCH: 23.5 pg — ABNORMAL LOW (ref 26.0–34.0)
MCHC: 32.6 % (ref 32.0–36.0)
MCV: 72.1 fL — ABNORMAL LOW (ref 80.0–98.0)
MPV: 9.6 fL (ref 7.4–10.4)
Monocytes: 6.6 %
Platelets: 260 10*3/uL (ref 140–440)
RBC: 5.92 10*6/uL — ABNORMAL HIGH (ref 4.40–5.90)
RDW: 16.6 % — ABNORMAL HIGH (ref 11.5–14.5)
WBC: 7.5 10*3/uL (ref 3.6–10.7)

## 2015-11-27 LAB — TROPONIN
Troponin I: <0.015 ng/mL (ref 0.000–0.045)
Troponin I: <0.015 ng/mL (ref 0.000–0.045)

## 2015-11-27 LAB — BASIC METABOLIC PANEL
Anion Gap: 8 NA
BUN: 21 mg/dL (ref 7–25)
CO2: 25 mmol/L (ref 21–32)
Calcium: 8.6 mg/dL (ref 8.2–10.1)
Chloride: 108 mmol/L (ref 98–109)
Creatinine: 1 mg/dL (ref 0.55–1.40)
EGFR IF NonAfrican American: >60 mL/min (ref >60)
Glucose: 140 mg/dL — ABNORMAL HIGH (ref 70–100)
Potassium: 3.6 mmol/L (ref 3.5–5.1)
Sodium: 141 mmol/L (ref 135–145)
eGFR African American: >60 mL/min (ref >60)

## 2015-11-27 MED ORDER — NORMAL SALINE FLUSH 0.9 % IV SOLN
0.9 % | INTRAVENOUS | Status: DC | PRN
Start: 2015-11-27 — End: 2015-11-28

## 2015-11-27 MED ORDER — NORMAL SALINE FLUSH 0.9 % IV SOLN
0.9 % | Freq: Two times a day (BID) | INTRAVENOUS | Status: DC
Start: 2015-11-27 — End: 2015-11-28
  Administered 2015-11-28: 02:00:00 10 mL via INTRAVENOUS

## 2015-11-27 MED ORDER — ALBUTEROL SULFATE HFA 108 (90 BASE) MCG/ACT IN AERS
108 (90 Base) MCG/ACT | Freq: Four times a day (QID) | RESPIRATORY_TRACT | Status: DC | PRN
Start: 2015-11-27 — End: 2015-11-28

## 2015-11-27 MED ORDER — ASPIRIN 81 MG PO CHEW
81 MG | Freq: Once | ORAL | Status: AC
Start: 2015-11-27 — End: 2015-11-27
  Administered 2015-11-27: 16:00:00 324 mg via ORAL

## 2015-11-27 MED ORDER — PERFLUTREN LIPID MICROSPHERE IV SUSP
Freq: Once | INTRAVENOUS | Status: DC | PRN
Start: 2015-11-27 — End: 2015-11-28

## 2015-11-27 MED ORDER — ACETAMINOPHEN 650 MG RE SUPP
650 MG | RECTAL | Status: DC | PRN
Start: 2015-11-27 — End: 2015-11-28

## 2015-11-27 MED ORDER — SODIUM CHLORIDE 0.9 % IV SOLN
0.9 % | INTRAVENOUS | Status: DC
Start: 2015-11-27 — End: 2015-11-28

## 2015-11-27 MED ORDER — MORPHINE SULFATE (PF) 4 MG/ML IV SOLN
4 MG/ML | Freq: Once | INTRAVENOUS | Status: AC
Start: 2015-11-27 — End: 2015-11-27
  Administered 2015-11-27: 20:00:00 4 mg via INTRAVENOUS

## 2015-11-27 MED ORDER — FAMOTIDINE 20 MG/2ML IV SOLN
20 MG/2ML | Freq: Two times a day (BID) | INTRAVENOUS | Status: DC
Start: 2015-11-27 — End: 2015-11-28
  Administered 2015-11-28: 02:00:00 20 mg via INTRAVENOUS

## 2015-11-27 MED ORDER — ASPIRIN 81 MG PO TBEC
81 MG | Freq: Every day | ORAL | Status: DC
Start: 2015-11-27 — End: 2015-11-28
  Administered 2015-11-28: 14:00:00 81 mg via ORAL

## 2015-11-27 MED ORDER — DIPHENHYDRAMINE HCL 50 MG/ML IJ SOLN
50 MG/ML | Freq: Once | INTRAMUSCULAR | Status: AC
Start: 2015-11-27 — End: 2015-11-27
  Administered 2015-11-27: 16:00:00 25 mg via INTRAVENOUS

## 2015-11-27 MED ORDER — ENOXAPARIN SODIUM 40 MG/0.4ML SC SOLN
40 MG/0.4ML | Freq: Every day | SUBCUTANEOUS | Status: DC
Start: 2015-11-27 — End: 2015-11-28
  Administered 2015-11-28 (×2): 40 mg via SUBCUTANEOUS

## 2015-11-27 MED ORDER — ONDANSETRON HCL 4 MG/2ML IJ SOLN
4 MG/2ML | Freq: Four times a day (QID) | INTRAMUSCULAR | Status: DC | PRN
Start: 2015-11-27 — End: 2015-11-28

## 2015-11-27 MED ORDER — LABETALOL HCL 5 MG/ML IV SOLN
5 MG/ML | INTRAVENOUS | Status: DC | PRN
Start: 2015-11-27 — End: 2015-11-28

## 2015-11-27 MED ORDER — METOCLOPRAMIDE HCL 5 MG/ML IJ SOLN
5 MG/ML | Freq: Once | INTRAMUSCULAR | Status: AC
Start: 2015-11-27 — End: 2015-11-27
  Administered 2015-11-27: 16:00:00 10 mg via INTRAVENOUS

## 2015-11-27 MED FILL — ACEPHEN 650 MG RE SUPP: 650 MG | RECTAL | Qty: 1

## 2015-11-27 MED FILL — METOCLOPRAMIDE HCL 5 MG/ML IJ SOLN: 5 MG/ML | INTRAMUSCULAR | Qty: 2

## 2015-11-27 MED FILL — ALBUTEROL SULFATE HFA 108 (90 BASE) MCG/ACT IN AERS: 108 (90 Base) MCG/ACT | RESPIRATORY_TRACT | Qty: 1.2

## 2015-11-27 MED FILL — ASPIRIN LOW STRENGTH 81 MG PO CHEW: 81 MG | ORAL | Qty: 4

## 2015-11-27 MED FILL — ASPIRIN EC 81 MG PO TBEC: 81 MG | ORAL | Qty: 1

## 2015-11-27 MED FILL — MORPHINE SULFATE (PF) 4 MG/ML IV SOLN: 4 mg/mL | INTRAVENOUS | Qty: 1

## 2015-11-27 MED FILL — DIPHENHYDRAMINE HCL 50 MG/ML IJ SOLN: 50 MG/ML | INTRAMUSCULAR | Qty: 1

## 2015-11-27 NOTE — ED Notes (Signed)
Pt complains of pain, physician aware      Clemon ChambersCourtney M. Mare LoanMohney, RN  11/27/15 1517

## 2015-11-27 NOTE — ED Notes (Signed)
Pt to ct via transport     Ronal FearWendy Iolani Twilley, RN  11/27/15 1248

## 2015-11-27 NOTE — ED Notes (Signed)
Bed: 29  Expected date:   Expected time:   Means of arrival:   Comments:  squad     Courtney M. Mare LoanMohney, RN  11/27/15 1122

## 2015-11-27 NOTE — H&P (Signed)
[facility] Initial History and Physical       Netta NeatVicRochester Psychiatric Centertor V Scardina  DOB:  Oct 18, 1965(50 y.o.)  MRN:  [id number]    Date: November 27, 2015       Subjective:      Chief Complaint: arm numbness    HPI:  Patient says he has had a baseline headache for years.  Last evening his headache was worse.  He awoke at 6:00 am this morning with numbness of his left arm (from his shoulder to his fingers).   He then experienced blurry vision at 9:00 am.    The numbness gradually improved while he was in the ER, and he currently has mild numbness in his left fingertips only.  He has no blurry vision.    He denies weakness.  His headache is at baseline.      When he arrived in the ER, he had substernal chest tightness without radiation, and without shortness of breath.  The chest tightness only lasted a few moments and is now relieved.    He has had no recent history of chest pain.  He has no angina symptoms.      He did have chest pain in 2010 and had [facility] cardiac [facility].  He says he may have had another cardiac cath in 2012.      Past Medical History:   Diagnosis Date   ??? Asthma    ??? GERD (gastroesophageal reflux disease)        Past Surgical History:   Procedure Laterality Date   ??? CARDIAC CATHETERIZATION      Unremarkable findings at [facility] X2   ??? ORBITAL FRACTURE SURGERY Left     remote       Family History   Problem Relation Age of Onset   ??? Asthma Mother    ??? No Known Problems Son    ??? No Known Problems Daughter        Social History     Social History   ??? Marital status: Legally Separated     Spouse name: N/A   ??? Number of children: N/A   ??? Years of education: N/A     Occupational History   ??? Not on file.     Social History Main Topics   ??? Smoking status: Current Every Day Smoker     Packs/day: 0.50     Types: Cigarettes     Start date: 11/02/1985   ??? Smokeless tobacco: Never Used   ??? Alcohol use No   ??? Drug use: No   ??? Sexual activity: Not on file     Other Topics Concern   ??? Not on file      Social History Narrative       No Known Allergies    Prior to Admission medications    Medication Sig Start Date End Date Taking? Authorizing Provider   aspirin 81 MG tablet Take 81 mg by mouth daily   Yes Historical Provider, MD   albuterol sulfate HFA 108 (90 Base) MCG/ACT inhaler Inhale 2 puffs into the lungs every 6 hours as needed for Wheezing 11/08/15   Argie Ramminghristopher L Carmichael, DO   sildenafil (VIAGRA) 50 MG tablet Take 1 tablet by mouth as needed for Erectile Dysfunction 11/08/15   Argie Ramminghristopher L Carmichael, DO   ibuprofen (ADVIL;MOTRIN) 200 MG tablet Take 200 mg by mouth every 6 hours as needed for Pain    Historical Provider, MD  ROS  10 point ROS reviewed and negative except per HPI    Objective:        Vitals:    11/27/15 1123 11/27/15 1210 11/27/15 1308 11/27/15 1350   BP: 134/83 (!) 144/82 120/78 113/70   Pulse: 77 71 68 61   Resp: 16 16 16 16    Temp: 99 ??F (37.2 ??C)      TempSrc: Oral      SpO2: 100%      Weight: 225 lb (102.1 kg)      Height: 6\' 3"  (1.905 m)        Physical Exam   ///  NAD, AAOx3, Normal affect  Lungs CTAB, Normal effort  PERRL, Normal conjunctiva, Hearing intact  No JVD, No thyroid tenderness, neck supple  RRR, No murmurs. No edema. +2 pulses bilaterally  Abdomen: +BS, soft, nontender, nondistended, no HSP appreciated  CN II-XII grossly intact, Normal sensation, NIH = 0  Skin warm, No rash     EKG: NSR, no acute STT abnormalities.     Lab Results   Component Value Date    NA 141 11/27/2015    K 3.6 11/27/2015    CL 108 11/27/2015    CO2 25 11/27/2015    BUN 21 11/27/2015    CREATININE 1.00 11/27/2015    GLUCOSE 140 (H) 11/27/2015    CALCIUM 8.6 11/27/2015    PROT 7.5 11/03/2015    LABALBU 3.9 11/03/2015    BILITOT 0.5 11/03/2015    ALKPHOS 104 11/03/2015    AST 19 11/03/2015    ALT 38 11/03/2015       Lab Results   Component Value Date    WBC 7.5 11/27/2015    HGB 13.9 11/27/2015    HCT 42.7 11/27/2015    MCV 72.1 (L) 11/27/2015    PLT 260 11/27/2015     Additional results  since admission have been reviewed.    Assessment and Plan:      Principal Problem:    Arm numbness left  Active Problems:    Chest pain, atypical      1. Left arm numbness, blurry vision, headache: symptoms resolved with exception of mild numbness in left hand fingertips.  His sensation is intact.  Will check MRI/A head/neck.  Consult Neurology.  Continue ASA.  Check lipids (recently checked, LDL 115).  Telemetry.    2. Atypical chest pain: resolved.  EKG without acute STT abnormalities.  Cycle cardiac enzymes.  ASA.  [facility] and another may have been done in 2012.  Check echo and request old medical records.   3. Tobacco use: smoking cessation discussed with patient and he is motivated to quit smoking.      DVT Prophylaxis: lovenox 40 q 24hr - creatinine clearance >30      BMI Classification: Body mass index is 28.12 kg/(m^2). overweight BMI 25-29.9      Disposition: await test results, await consultant recommendations and await clinical improvement    I spent over 51% of total time providing counseling or in coordination of care: > 70 minutes   discussed with patient and I personally reviewed chart, data, labs radiology reports    7AM-5PM please page:   Electronically signed by Ananias PilgrimVivek Zaide Kardell, MD on 11/27/15 at 3:03 PM    5PM-7AM please page:  [facility]

## 2015-11-27 NOTE — Telephone Encounter (Signed)
Name of caller requesting page: Sammy    Phone Number of caller: [phone]    Facility requesting page: [facility] [address]    Reason for Page: Nurse Sammy needs a call back from the doctor for X-ray orders    Provider paged: Dr. Olivia Mackieyree    Practice Name of paged provider: [facility]    Page Placed to #: [phone]    Time Page was sent or provider contacted: 9:27 PM    Method of Contact: Smart Web    Smart Web Page Content: [name] ACH3W, [id number] for PT Stephen Riley 2.10.1967 re X-ray orders [phone]

## 2015-11-27 NOTE — Other (Signed)
Prior to   Admission On  Admission Day of  Discharge   Barthel Index Scale          1 Feeding 10 = Independent, Able to apply any necessary device. Feeds            in reasonable time    5 = Needs help, i.e. for cutting    0 = Cannot perform activity   10      [x]    5        []  0        [] 10      [x]    5        []  0        [] 10      []    5        []  0        []      2 Bathing   5 = Performs without assistance    0 = Cannot perform activity   5        [x]  0        [] 5        [x]  0        [] 5        []  0        []         3 Grooming   5 = Washes face, combs hair, brushes teeth, shaves     0 = Cannot perform activity   5        [x]  0        [] 5        [x]  0        [] 5        []  0        []      4 Dressing 10 = Independent, Ties shows, fasteners, applies braces    5 = Needs help, but does at least half of task within                            reasonable time    0 = Cannot perform activity   10      [x]  5        []    0        [] 10      [x]  5        []    0        [] 10      []  5        []    0        []      5 Bowel Control 10 = No accidents. Able to use enema or suppository if needed    5 = Occasional accidents or needs help with device    0 = Cannot perform activity   10      [x]  5        []  0        [] 10      [x]  5        []  0        [] 10      []  5        []  0        []        6 Bladder Control 10 = No accidents. Able to care for collecting device, if used    5 = Occasional accidents or needs help with device    0 = Cannot perform activity   10      [x]  5        []  0        [] 10      [x]  5        []  0        []  10      []  5        []  0        []      7 Toilet Transfers 10 = Independent with toilet or bedpan. Handles clothes, wipes,          flushes or cleans pan    5 = Needs help for balance, handling clothes or toilet paper    0 = Cannot perform activity   10      [x]    5        []  0        [] 10      [x]    5        []  0        [] 10      []    5        []  0        []       8 Chair/Bed Transfers 15 = Independent, including locks of wheelchair and lifting                   footrests  10 = Minimum assistance or supervision required    5 = Able to sit, but needs maximum assistance to transfer    0 = Cannot perform activity   15      [x]    10      []  5        []  0        [] 15      [x]    10      []  5        []  0        [] 15      []    10      []  5        []  0        []      9 Ambulation 15 = Independent for 50 yards. May use assistive devices,                  except rolling walker  10 = With help for 50 yards    5 = Independent with wheelchair for 50 yards (only if unable to          walk)    0 = Cannot perform activity   15      [x]    10      []  5        []    0        []    15      [x]    10      []  5        []    0        []   15      []    10      []  5        []    0        []   10 Stairs 10 = Independent. May use assistive devices    5 = Needs help or supervision    0 = Cannot perform activity 10      [x]  5        []  0        [] 10      [x]  5        []  0        [] 10      []  5        []  0        []   Total Barthel Index Score  100 100      Modified Rankin Score 0 = No symptoms at all  1 = No significant disability despite symptoms; able to carry out        all usual duties and activities  2 = Slight disability; unable to carry out all previous activities,            but able to look after own affairs without assistance  3 = moderate disability; requiring some help, but able to walk            without assistance  4 = Moderately severe disability; unable to walk without                     assistance and unable to attend to own bodily needs without       assistance  5 = Severe disability; bedridden, incontinent and requiring                 constant nursing care and attention 0        [x]  1        []    2        []    3        []    4        []      5        [] 0        []  1        [x]    2        []    3        []    4        []      5        [] 0        []  1         []    2        []    3        []    4        []      5        []

## 2015-11-27 NOTE — ED Provider Notes (Signed)
CHIEF COMPLAINT       Chief Complaint   Patient presents with   ??? Headache     started last night, patient woke up with continued H/A and Left arm pain and numbness         HISTORY OF PRESENT ILLNESS   (Location/Symptom, Timing/Onset, Context/Setting, Quality, Duration, Modifying Factors, Severity) Note limiting factors.   HPI  Stephen Riley is a 50 y.o. male with a past medical history of asthma, GERD who presents to the emergency department with complaints of:  1. LEFT shoulder blade pain radiating into his LEFT arm down to his fingers with associated left-sided chest heaviness feels like someone is sitting on his chest onset at 8 AM while getting ready for work. He denies associated shortness of breath, nausea vomiting, cough, fevers chills.  2. Diffuse headache similar to prior headaches since had in the past since last night and got worse this morning at 6 AM with associated double vision and dizziness, no neck pain, fevers, chills. Headache onset is insidious, not the worst headache.      Nursing Notes were reviewed.    REVIEW OF SYSTEMS    (2+ for level 4; 10+ for level 5)   Review of Systems     Constitutional: Negative for chills and fever.   HENT: Negative for ear pain, mouth sores and sore throat.  Eyes: Negative for pain and visual disturbance.   Respiratory: Negative for cough and shortness of breath.  Cardiovascular: POSITIVE  for chest pain. Negative for palpitations.   Gastrointestinal: Negative for abdominal pain, diarrhea, nausea and vomiting.   Endocrine: Negative for polydipsia, polyphagia and polyuria.   Genitourinary: Negative for dysuria, flank pain and frequency.   Musculoskeletal: Negative for gait problem and myalgias.   Neurological: Negative for dizziness, syncope. POSITIVE  headaches.   Psychiatric/Behavioral: Negative for confusion and suicidal ideas.       PAST MEDICAL HISTORY     Past Medical History:   Diagnosis Date   ??? Asthma    ??? GERD (gastroesophageal reflux disease)         SURGICAL HISTORY       Past Surgical History:   Procedure Laterality Date   ??? CARDIAC CATHETERIZATION      Unremarkable findings at [facility] X2   ??? ORBITAL FRACTURE SURGERY Left     remote       CURRENT MEDICATIONS       Previous Medications    ALBUTEROL SULFATE HFA 108 (90 BASE) MCG/ACT INHALER    Inhale 2 puffs into the lungs every 6 hours as needed for Wheezing    ASPIRIN 81 MG TABLET    Take 81 mg by mouth daily    IBUPROFEN (ADVIL;MOTRIN) 200 MG TABLET    Take 200 mg by mouth every 6 hours as needed for Pain    SILDENAFIL (VIAGRA) 50 MG TABLET    Take 1 tablet by mouth as needed for Erectile Dysfunction       ALLERGIES     Review of patient's allergies indicates no known allergies.    FAMILY HISTORY       Family History   Problem Relation Age of Onset   ??? Asthma Mother    ??? No Known Problems Son    ??? No Known Problems Daughter         SOCIAL HISTORY       Social History     Social History   ??? Marital status: Legally Separated  Spouse name: N/A   ??? Number of children: N/A   ??? Years of education: N/A     Social History Main Topics   ??? Smoking status: Current Every Day Smoker     Packs/day: 0.50     Types: Cigarettes     Start date: 11/02/1985   ??? Smokeless tobacco: Never Used   ??? Alcohol use No   ??? Drug use: No   ??? Sexual activity: Not Asked     Other Topics Concern   ??? None     Social History Narrative       SCREENINGS           PHYSICAL EXAM    (up to 7 for level 4, 8 or more for level 5)   ED Triage Vitals   BP Temp Temp Source Pulse Resp SpO2 Height Weight   11/27/15 1123 11/27/15 1123 11/27/15 1123 11/27/15 1123 11/27/15 1123 11/27/15 1123 11/27/15 1123 11/27/15 1123   134/83 99 ??F (37.2 ??C) Oral 77 16 100 % 6\' 3"  (1.905 m) 225 lb (102.1 kg)       Physical Exam      Constitutional: Oriented to person, place, and time. Appears well-developed and well-nourished.   HENT:   Head: Normocephalic and atraumatic.   Eyes: Conjunctivae are normal. Pupils are equal, round, and reactive to light.   Neck: Normal range  of motion. Neck supple.   Cardiovascular: Normal rate and regular rhythm.    Pulmonary/Chest: Effort normal and breath sounds normal. Lungs clear bilaterally.  Abdominal: Soft. Bowel sounds are normal. No distension. No rebound.   Musculoskeletal: Normal range of motion. No edema or deformity.   Neurological: Alert and oriented to person, place, and time. No focal neurological deficits. No nystagmus.  Skin: Skin is warm and dry.   Psychiatric: Normal mood and affect. Behavior is normal. Judgment normal.   Vitals reviewed.      DIAGNOSTIC RESULTS     EKG (Per Emergency Physician):   EKG normal sinus rhythm, no acute ST elevation, no T-wave inversion, normal rate, normal rhythm, normal axis, noted for occasional PVCs.      RADIOLOGY (Per Emergency Physician):       Interpretation per the Radiologist below, if available at the time of this note:  Xr Chest Standard Two Vw    Result Date: 11/27/2015  Patient Name:  MYKEAL, CARRICK MRN:  [id number] FIN:  [id number] ---Diagnostic Radiology--- Exam Date/Time        11/27/2015 13:18:47 EDT                             Exam                  CR Chest PA/LAT                                     Ordering Physician    Mcarthur Rossetti, NP, Erskin Burnet                              Accession Number      [id number]                                       CPT4  Codes 16109 () Reason For Exam CP Report EXAMINATION: PA and Lateral Chest. COMPARISON: 04/29/2015. REASON FOR STUDY: Chest pain, left shoulder pain; smoking history. FINDINGS: The cardiac silhouette is normal in size. No mediastinal abnormality is observed.  The lungs are hyperexpanded. No abnormal pleuroparenchymal opacity is observed.      The bony thorax appears intact.     CONCLUSION(S):  1. No evidence of acute cardiopulmonary disease. 2. Pulmonary hyperaeration consistent with asthma. Report Dictated on Workstation: [id number] ---** Final ---** Dictated: 11/27/2015 1:22 pm Dictating Physician: Suszanne Conners, MD, B NELSON Signed Date and Time:  11/27/2015 1:24 pm Signed by: Suszanne Conners, MD, B NELSON Transcribed Date and Time: 11/27/2015 1:22    Ct Head Wo Contrast    Result Date: 11/27/2015  Patient Name:  PRASHANT, GLOSSER MRN:  [id number] FIN:  [id number] ---CT--- Exam Date/Time        11/27/2015 12:50:45 EDT                              Exam                  CT Head or Brain w/o Contrast                        Ordering Physician    Mcarthur Rossetti, NP, Erskin Burnet                               Accession Number      [id number]                                        CPT4 Codes 62952 () Reason For Exam headache Report Clinical EXAMINATION: CT of the Head without Contrast. COMPARISON: None. REASON FOR STUDY: Headache. TECHNIQUE: Contiguous multiplanar  3 mm  images were extended from the skull base through the vertex.    FINDINGS: Brain: Gray-white matter differentiation appears normal. No focal parenchymal abnormality or mass effect is observed. Ventricles And Cisterns: The ventricular system, cisterns and sulci are within normal limits. Extra-Axial Spaces: No extra-axial abnormality is observed. Orbits: The orbits are symmetrical and within normal limits. Sinuses: Visualized paranasal sinuses and mastoid air cells are pneumatized. No mucosal thickening or fluid accumulation is observed. Skull And Scalp: No skull defect is observed. There is no appreciable scalp lesion. CONCLUSION(S):   Negative CT scan of the head. Report Dictated on Workstation: [id number] ---** Final ---** Dictated: 11/27/2015 1:13 pm Dictating Physician: Suszanne Conners, MD, B NELSON Signed Date and Time: 11/27/2015 1:15 pm Signed by: Suszanne Conners, MD, B NELSON Transcribed Date and Time: 11/27/2015 1:13      ED BEDSIDE ULTRASOUND:   Performed by ED Physician - none    LABS:  Labs Reviewed   CBC WITH AUTO DIFFERENTIAL - Abnormal; Notable for the following:        Result Value    RBC 5.92 (*)     MCV 72.1 (*)     MCH 23.5 (*)     RDW 16.6 (*)     All other components within normal limits    Narrative:     Test  Performed by [facility], [address], [place], [place]  [address]   BASIC METABOLIC PANEL - Abnormal; Notable for the following:  Glucose 140 (*)     All other components within normal limits    Narrative:     Test Performed by [facility], [address] [place], [place]  [id number]   TROPONIN    Narrative:     Test Performed by [facility], [address] [place], [place]  [id number]   TROPONIN    Narrative:     Test Performed by [facility], [address] [place], [place]  [id number]        All other labs were within normal range or not returned as of this dictation.    EMERGENCY DEPARTMENT COURSE and DIFFERENTIAL DIAGNOSIS/MDM:   Vitals:    Vitals:    11/27/15 1308 11/27/15 1350 11/27/15 1516 11/27/15 1608   BP: 120/78 113/70 113/72 126/84   Pulse: 68 61 57 55   Resp: 16 16 15 16    Temp:       TempSrc:       SpO2:   100% 99%   Weight:       Height:           Medications   aspirin chewable tablet 324 mg (324 mg Oral Given 11/27/15 1207)   metoclopramide (REGLAN) injection 10 mg (10 mg Intravenous Given 11/27/15 1207)   diphenhydrAMINE (BENADRYL) injection 25 mg (25 mg Intravenous Given 11/27/15 1207)   morphine (PF) injection 4 mg (4 mg Intravenous Given 11/27/15 1603)       MDM.  This is a 50 year old male with history of asthma and GERD who came in with LEFT sided chest pressure with LEFT shoulder blade pain radiating to his LEFT arm as well as tingling sensation to his LEFT fingertips. He also has a headache that started last night, got worse this morning upon waking up with associated double vision and dizziness, CT head negative. Otherwise normal physical exam.   CBC, BMP, troponin, chest x-ray otherwise within normal limits.  At this point is admitted to spite Dr. Dorian FurnaceBullen for observation.     REVAL:     Remained to have no focal neurological deficits. Headaches improved from 10 out of 10 to 8 out of 10.  CP free.    CRITICAL CARE TIME   Total Critical Care time was 0 minutes, excluding separately  reportable procedures.  There was a high probability of clinically significant/life threatening deterioration in the patient's condition which required my urgent intervention.     CONSULTS:  IP CONSULT TO NEUROLOGY  IP CONSULT TO CASE MANAGEMENT    PROCEDURES:  Unless otherwise noted below, none     Procedures    FINAL IMPRESSION      1. Other chest pain    2. Acute intractable headache, unspecified headache type          DISPOSITION/PLAN   DISPOSITION   Admitted for observation.    PATIENT REFERRED TO:  Argie RammingChristopher L Carmichael, DO  [address]  [address]  [place] [id number]  [phone]            DISCHARGE MEDICATIONS:  New Prescriptions    No medications on file          (Please note:  Portions of this note were completed with a voice recognition program.  Efforts were made to edit the dictations but occasionally words and phrases are mis-transcribed.)  Form [id number].J.[id number]    [facility] CenterARAH Norton PastelJANE N Nyla Creason, CNP (electronically signed)  Emergency Medicine Provider       Erskin BurnetSarah Jane  Rolly Salter, CNP  11/27/15 1632

## 2015-11-27 NOTE — ED Triage Notes (Signed)
Patient from home alone, states he has frequent headaches unrelieved by motrin. Patient took baby ASA this am.

## 2015-11-27 NOTE — ED Notes (Signed)
Bed coordinator called for room assignment. No available beds on 5e, will continue to monitor.      Clemon Chambersourtney M. Mare LoanMohney, RN  11/27/15 [phone]

## 2015-11-27 NOTE — ED Provider Notes (Signed)
Patient seen and examined in conjunction with advanced practice provider.  I independently performed history, physical exam, and made all diagnostic, treatment and disposition decisions.    Patient is a 50 year old male who has multiple complaints.  He states he has had intermittent headaches.  States in the last day he has felt some double vision as well as dizziness.  He also states he has some abnormal sensation in the left side of his chest over the last day.  No shortness of breath.  No abdominal pain.  He feels fatigued.  No weakness numbness or tingling of his extremities.    On exam he is awake and alert.  He is nontoxic in appearance.  Lungs clear to auscultation.  Heart regular rate and rhythm.  Abdomen soft nondistended without focal tenderness.  Extremities nontender.  Neurologically no focal motor or sensory or reflex deficits in all 4 extremities.  No facial droop noted.    Laboratory studies are obtained.  White count 7.5.  No anemia.  No renal insufficiency.  Troponin is normal.  Laboratory studies reviewed by this examiner.    Chest x-ray: No acute infiltrate or effusion.  Read by radiology.  Reviewed by this examiner.    Electrocardiogram: Normal sinus rhythm.  Rate is 75.  Occasional PVC.  Normal axis.  No ST or T-wave changes acutely.  No old electrocardiogram available here for comparison.  Today's electrocardiogram read by this examiner.    CT brain shows no acute abnormality.  Read by radiology.  Reviewed by this examiner.    Medical decision-making: Patient has neurologic symptoms since yesterday that could be cerebellar in nature.  I do feel he needs to be observed for further neurologic evaluation.  He also has chest pain would benefit from further cardiac markers.  We will make arrangements for admission.  Patient is admitted in fair condition.     Felizardo HoffmannKevin D Trinnity Breunig, MD  11/27/15 [phone]

## 2015-11-27 NOTE — Other (Signed)
NURSING BEDSIDE SWALLOW SCREENING    NIH Inclusion Criteria  Yes No        [x]       []   NIH score for 1a (LOC) is 0       [x]       []   NIH score for 1c (LOC commands) is 0       [x]       []   NIH score for 4 (facial palsy) is 0 or 1       [x]       []   NIH score for 9 (best language) is 0, 1 or 2       [x]       []   NIH score for 10 (dysarthria) is 0 or 1       [x]       []   Patient is able to sit up in bed alone       [x]    YES TO ALL OF THE ABOVE, proceed to protocol        []   NO, patient does not meet all of the above criteria, KEEP STRICT NPO UNTIL SEEN BY SPEECH THERAPY      Protocol: If patient fails ANY part of ANY step, STOP FURTHER TESTING  Give 1 tsp Lemon Ice  Yes/Pass No/Fail        [x]       []   Prompt swallow present       [x]       []   No cough       [x]       []   No change in vocal quality     Give 1 tsp Applesauce  Yes/Pass No/Fail        [x]       []   Prompt swallow present       [x]       []   No cough       [x]       []   No change in vocal quality     Give 1 tsp H20 via spoon  Yes/Pass No/Fail        [x]       []   Prompt swallow present       [x]       []   No cough       [x]       []   No change in vocal quality     Bedside Swallow Result      [x]   Passed the swallow screening AND has an NIH of ???0??? for dysarthria and ???0??? for best language. OK to begin Cardiac Diet with thin liquids or other prescribed diet.       []   Passed the swallow screening AND has points on NIH for dysarthria and/or best language. OK to begin Dysphagia I pureed diet with nectar thickened liquids. Proceed to formal Speech Pathologist Swallow Evaluation.       []   FAILED the swallow screening. Remain STRICT NPO (do not give p.o. meds), proceed to formal Speech Pathologist Swallow Evaluation

## 2015-11-28 LAB — LIPID PANEL
Chol/HDL Ratio: 3 NA
Cholesterol: 130 mg/dL (ref <200)
HDL: 40 mg/dL (ref 40–59)
LDL Cholesterol: 53 mg/dL (ref <100)
Triglycerides: 185 mg/dL — AB (ref <150)

## 2015-11-28 LAB — CBC WITH AUTO DIFFERENTIAL
Absolute Baso #: 0.1 10*3/uL (ref 0.0–0.2)
Absolute Eos #: 0.3 10*3/uL (ref 0.0–0.5)
Absolute Lymph #: 3.2 10*3/uL (ref 1.0–4.3)
Absolute Mono #: 0.4 10*3/uL (ref 0.0–0.8)
Absolute Neut #: 1.8 10*3/uL (ref 1.8–7.0)
Basophils: 0.9 %
Eosinophils: 4.7 %
Granulocytes %: 31.7 %
Hematocrit: 38.2 % — ABNORMAL LOW (ref 40.0–52.0)
Hemoglobin: 12.3 g/dL — ABNORMAL LOW (ref 13.0–18.0)
Lymphocyte %: 55.5 %
MCH: 23.4 pg — ABNORMAL LOW (ref 26.0–34.0)
MCHC: 32.2 % (ref 32.0–36.0)
MCV: 72.7 fL — ABNORMAL LOW (ref 80.0–98.0)
MPV: 10.4 fL (ref 7.4–10.4)
Monocytes: 7.2 %
Platelets: 232 10*3/uL (ref 140–440)
RBC: 5.25 10*6/uL (ref 4.40–5.90)
RDW: 16.6 % — ABNORMAL HIGH (ref 11.5–14.5)
WBC: 5.8 10*3/uL (ref 3.6–10.7)

## 2015-11-28 LAB — HEMOGLOBIN A1C
Hemoglobin A1C: 5.6 % (ref 4.0–5.6)
eAG: 114 mg/dL

## 2015-11-28 LAB — BASIC METABOLIC PANEL
Anion Gap: 7 NA
BUN: 19 mg/dL (ref 7–25)
CO2: 24 mmol/L (ref 21–32)
Calcium: 8.1 mg/dL — ABNORMAL LOW (ref 8.2–10.1)
Chloride: 109 mmol/L (ref 98–109)
Creatinine: 0.9 mg/dL (ref 0.55–1.40)
EGFR IF NonAfrican American: >60 mL/min (ref >60)
Glucose: 111 mg/dL — ABNORMAL HIGH (ref 70–100)
Potassium: 3.7 mmol/L (ref 3.5–5.1)
Sodium: 140 mmol/L (ref 135–145)
eGFR African American: >60 mL/min (ref >60)

## 2015-11-28 LAB — TROPONIN: Troponin I: <0.015 ng/mL (ref 0.000–0.045)

## 2015-11-28 LAB — ECHOCARDIOGRAM COMPLETE 2D W DOPPLER W COLOR: Left Ventricular Ejection Fraction: 56

## 2015-11-28 LAB — ALBUMIN: Albumin,Serum: 3.3 g/dL — ABNORMAL LOW (ref 3.4–5.0)

## 2015-11-28 MED ORDER — PROCHLORPERAZINE MALEATE 10 MG PO TABS
10 MG | Freq: Three times a day (TID) | ORAL | Status: DC
Start: 2015-11-28 — End: 2015-11-28
  Administered 2015-11-28: 17:00:00 10 mg via ORAL

## 2015-11-28 MED ORDER — OXYCODONE-ACETAMINOPHEN 5-325 MG PO TABS
5-325 MG | ORAL | Status: DC
Start: 2015-11-28 — End: 2015-11-28
  Administered 2015-11-28 (×2): 1 via ORAL

## 2015-11-28 MED ORDER — IBUPROFEN 200 MG PO TABS
200 MG | Freq: Once | ORAL | Status: AC
Start: 2015-11-28 — End: 2015-11-28
  Administered 2015-11-28: 15:00:00 200 mg via ORAL

## 2015-11-28 MED ORDER — PROCHLORPERAZINE MALEATE 10 MG PO TABS
10 MG | ORAL_TABLET | Freq: Three times a day (TID) | ORAL | 0 refills | Status: DC
Start: 2015-11-28 — End: 2016-02-01

## 2015-11-28 MED ORDER — FAMOTIDINE 20 MG PO TABS
20 MG | Freq: Two times a day (BID) | ORAL | Status: DC
Start: 2015-11-28 — End: 2015-11-28
  Administered 2015-11-28: 14:00:00 20 mg via ORAL

## 2015-11-28 MED ORDER — OXYCODONE-ACETAMINOPHEN 5-325 MG PO TABS
5-325 MG | ORAL_TABLET | Freq: Three times a day (TID) | ORAL | 0 refills | Status: AC
Start: 2015-11-28 — End: 2015-11-30

## 2015-11-28 MED ORDER — ACETAMINOPHEN 325 MG PO TABS
325 MG | ORAL | Status: DC | PRN
Start: 2015-11-28 — End: 2015-11-28
  Administered 2015-11-28 (×2): 650 mg via ORAL

## 2015-11-28 MED FILL — IBU-200 200 MG PO TABS: 200 MG | ORAL | Qty: 1

## 2015-11-28 MED FILL — PROCHLORPERAZINE MALEATE 10 MG PO TABS: 10 MG | ORAL | Qty: 1

## 2015-11-28 MED FILL — LOVENOX 40 MG/0.4ML SC SOLN: 40 MG/0.4ML | SUBCUTANEOUS | Qty: 0.4

## 2015-11-28 MED FILL — FAMOTIDINE 20 MG PO TABS: 20 MG | ORAL | Qty: 1

## 2015-11-28 MED FILL — ACETAMINOPHEN 325 MG PO TABS: 325 MG | ORAL | Qty: 2

## 2015-11-28 MED FILL — OXYCODONE-ACETAMINOPHEN 5-325 MG PO TABS: 5-325 MG | ORAL | Qty: 1

## 2015-11-28 MED FILL — ASPIRIN EC 81 MG PO TBEC: 81 MG | ORAL | Qty: 1

## 2015-11-28 MED FILL — FAMOTIDINE 20 MG/2ML IV SOLN: 20 MG/2ML | INTRAVENOUS | Qty: 2

## 2015-11-28 NOTE — Progress Notes (Signed)
Occupational Therapy Discharge  Received orders for OT Eval and Treat per Barthel Index. Barthel Index was 100 prior to admit and is 100 on admit. Per Therapy Services guidelines, no OT indicated. Discharge OT; please re-consult if needed.  Greggory Brandy, OTR/L
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
## 2015-11-28 NOTE — Progress Notes (Signed)
Speech Language Pathology  Patient passed the Nursing Swallowing Screening and is on a Cardiac diet.  Completed speech orders as per stroke protocol.

## 2015-11-28 NOTE — Other (Unsigned)
Patient Acct Nbr: [id number]   Primary AUTH/CERT:   Primary Insurance Company Name: [employer]  Primary Insurance Plan name: [employer]  Primary Insurance Group Number:   Primary Insurance Plan Type: Health  Primary Insurance Policy Number: [id number]

## 2015-11-28 NOTE — Care Coordination-Inpatient (Signed)
Patient from home alone functioning independently prior to admission. Patient observed by TCC ambulating independently from his bed to the hallway to get on cart to MRI testing. Has orders for consult to Neurology, MRI/MRA, and echocardiogram. Has insurance with prescription coverage. Denies any needs at discharge. Discharge checklist given. Electronically signed by Karie Sodarystal Engelbert Sevin, RN on 11/28/2015 at 11:36 AM
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
## 2015-11-28 NOTE — Progress Notes (Signed)
Patient ok for discharge per Dr. Christie BeckersMathur. IV removed and discharge instructions/prescriptions given to patient. Patient states no further questions at this time. Family will pick him up. Electronically signed by Almedia Ballsale Noemi Ishmael, RN on 11/28/2015 at 6:33 PM

## 2015-11-28 NOTE — Telephone Encounter (Signed)
reviewed

## 2015-11-28 NOTE — Progress Notes (Signed)
Physical Therapy    Received orders for PT eval and treat per Barthel Index. Barthel Index was 100 prior to admit and is 100 on admission. Based on scoring, no PT indicated. Discharge PT.  Dylanie Quesenberry, PT

## 2015-11-28 NOTE — Plan of Care (Signed)
Problem: NEUROLOGICAL DEFICIT  Goal: Neurological status is stable or improving  Outcome: Ongoing
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
## 2015-11-28 NOTE — Telephone Encounter (Signed)
[facility] Discharge Day Notification           Patient to be discharged today 11/28/15. Please see discharge summary under note section for details and info for follow up call. Scheduled for a hospital follow up with Dr. Kyla Balzarinearmichael on 12/12/15 at 2:30p.  Thank you.    INSTRUCTIONS TO MA/SW: Please call patient on day after discharge (must document patient  contacted within 2 business days of discharge).    FOLLOW UP QUESTIONS FOR MA/SW:  1. Did you get medications filled and taking them as instructed from discharge?  2. Are you following your discharge instructions from your hospital stay?  3. Please confirm patient is scheduled for a follow up appointment within the above time frame.

## 2015-11-29 NOTE — Telephone Encounter (Signed)
1. Will be getting today  2. Yes  3. Aware of appt

## 2015-12-12 ENCOUNTER — Encounter: Payer: PRIVATE HEALTH INSURANCE | Attending: Internal Medicine | Primary: Internal Medicine

## 2015-12-12 NOTE — Progress Notes (Signed)
States he had a colonoscopy a long time ago, states he was referred to gastro but was never called.

## 2015-12-13 NOTE — Progress Notes (Signed)
This encounter was created in error - please disregard.

## 2016-01-04 ENCOUNTER — Inpatient Hospital Stay
Admit: 2016-01-04 | Discharge: 2016-01-04 | Disposition: A | Discharge status: Home / Self Care | Attending: Emergency Medicine

## 2016-01-04 DIAGNOSIS — J45901 Unspecified asthma with (acute) exacerbation: Secondary | ICD-10-CM

## 2016-01-04 MED ORDER — PREDNISONE 20 MG PO TABS
20 MG | Freq: Once | ORAL | Status: AC
Start: 2016-01-04 — End: 2016-01-04
  Administered 2016-01-04: 08:00:00 60 mg via ORAL

## 2016-01-04 MED ORDER — PREDNISONE 20 MG PO TABS
20 MG | ORAL_TABLET | Freq: Every day | ORAL | 0 refills | Status: AC
Start: 2016-01-04 — End: 2016-01-09

## 2016-01-04 MED ORDER — IPRATROPIUM-ALBUTEROL 0.5-2.5 (3) MG/3ML IN SOLN
RESPIRATORY_TRACT | Status: AC
Start: 2016-01-04 — End: 2016-01-04
  Administered 2016-01-04 (×3): 1 via RESPIRATORY_TRACT

## 2016-01-04 MED FILL — PREDNISONE 20 MG PO TABS: 20 MG | ORAL | Qty: 3

## 2016-01-04 NOTE — Other (Unsigned)
Patient Acct Nbr: [id number]   Primary AUTH/CERT:   Primary Insurance Company Name: [employer]  Primary Insurance Plan name: [employer]  Primary Insurance Group Number:   Primary Insurance Plan Type: Health  Primary Insurance Policy Number: [id number]

## 2016-01-04 NOTE — ED Triage Notes (Signed)
Patient complains of increased SOb. Patient states that he has asthma. States that he has been coughing more lately.

## 2016-01-04 NOTE — Discharge Instructions (Signed)
Asthma Attack: Care Instructions  Your Care Instructions    During an asthma attack, the airways swell and narrow. This makes it hard to breathe. Severe asthma attacks can be life-threatening, but you can help prevent them by keeping your asthma under control and treating symptoms before they get bad. Symptoms include being short of breath, having chest tightness, coughing, and wheezing. Noting and treating these symptoms can also help you avoid future trips to the emergency room.  The doctor has checked you carefully, but problems can develop later. If you notice any problems or new symptoms, get medical treatment right away.  Follow-up care is a key part of your treatment and safety. Be sure to make and go to all appointments, and call your doctor if you are having problems. It's also a good idea to know your test results and keep a list of the medicines you take.  How can you care for yourself at home?   Follow your asthma action plan to prevent and treat attacks. If you don't have an asthma action plan, work with your doctor to create one.   Take your asthma medicines exactly as prescribed. Talk to your doctor right away if you have any questions about how to take them.   Use your quick-relief medicine when you have symptoms of an attack. Quick-relief medicine is usually an albuterol inhaler. Some people need to use quick-relief medicine before they exercise.   Take your controller medicine every day, not just when you have symptoms. Controller medicine is usually an inhaled corticosteroid. The goal is to prevent problems before they occur. Don't use your controller medicine to treat an attack that has already started. It doesn't work fast enough to help.   If your doctor prescribed corticosteroid pills to use during an attack, take them exactly as prescribed. It may take hours for the pills to work, but they may make the episode shorter and help you breathe better.   Keep your quick-relief medicine  with you at all times.   Talk to your doctor before using other medicines. Some medicines, such as aspirin, can cause asthma attacks in some people.   If you have a peak flow meter, use it to check how well you are breathing. This can help you predict when an asthma attack is going to occur. Then you can take medicine to prevent the asthma attack or make it less severe.   Do not smoke or allow others to smoke around you. Avoid smoky places. Smoking makes asthma worse. If you need help quitting, talk to your doctor about stop-smoking programs and medicines. These can increase your chances of quitting for good.   Learn what triggers an asthma attack for you, and avoid the triggers when you can. Common triggers include colds, smoke, air pollution, dust, pollen, mold, pets, cockroaches, stress, and cold air.   Avoid colds and the flu. Get a pneumococcal vaccine shot. If you have had one before, ask your doctor if you need a second dose. Get a flu vaccine every fall. If you must be around people with colds or the flu, wash your hands often.  When should you call for help?  Call 911 anytime you think you may need emergency care. For example, call if:   You have severe trouble breathing.  Call your doctor now or seek immediate medical care if:   Your symptoms do not get better after you have followed your asthma action plan.   You have new or worse trouble   breathing.   Your coughing and wheezing get worse.   You cough up dark brown or bloody mucus (sputum).   You have a new or higher fever.  Watch closely for changes in your health, and be sure to contact your doctor if:   You need to use quick-relief medicine on more than 2 days a week (unless it is just for exercise).   You cough more deeply or more often, especially if you notice more mucus or a change in the color of your mucus.   You are not getting better as expected.  Where can you learn more?  Go to [email or web address] and sign in  to your MyChart account. Enter [phone] in the [place] box to learn more about "Asthma Attack: Care Instructions."     If you do not have an account, please click on the "Sign Up Now" link.  Current as of: July 08, 2015  Content Version: 11.3   2006-2017 [employer]. Care instructions adapted under license by [facility]. If you have questions about a medical condition or this instruction, always ask your healthcare professional. [place] any warranty or liability for your use of this information.         Asthma: Your Action Plan  Sample Action Plan  Controller medicine action plan  Fill in the blank spaces and boxes that apply for all sections.   Name of your controller medicine:   ____________________________________________   How much of this medicine do you take?   ____________________________________________   How often do you take this medicine?   ____________________________________________   Other instructions?   ____________________________________________  Quick-relief medicine action plan   Name of your quick-relief medicine:   ____________________________________________   How much of this medicine do you take?   ____________________________________________   How often do you take this medicine?   ____________________________________________  Asthma Zones  GREEN ZONE: This is where you want to be!  Green zone symptoms   You have no shortness of breath or chest tightness. You are not coughing or wheezing.   You can do all of your usual activities.   You sleep well at night.  Green zone peak flow (if you use a peak flow meter)   ______ or more (80% or more of your personal best)  Green zone actions (Check the boxes and fill in the blank spaces that apply.)  [ ] You take your controller medicine(s) every day.  [ ] You are staying away from your asthma triggers.  [ ] You take quick-relief medicine (called _____________________) ______  minutes before exercise.  YELLOW ZONE: Your asthma is getting worse.  Yellow zone symptoms   You are short of breath or have chest tightness. You are coughing or wheezing.   You have symptoms that keep you up at night.   You can do some, but not all, of your usual activities.  Yellow zone peak flow (if you use a peak flow meter)   ______ to ______ (50% to 79% of your personal best)  Yellow zone actions (Check the boxes and fill in the blank spaces that apply.)  [ ] Take _____ puff(s) of quick-relief medicine called ______________________. Repeat _____ times.  [ ] If your symptoms don't get better or your peak flow has not returned to the green zone in 1 hour, then:   [ ] Take _____ puff(s) of medicine called ______________________. Take it ____ times a day.   [ ] Begin or increase treatment  with corticosteroid pills. Take ______ mg of medicine called ____________________________ every __________.   [ ]  Call your doctor at this number: ____________________.  RED ZONE: Danger!  Red zone symptoms   You are very short of breath.   You can't do your usual activities.   Quick-relief medicine doesn't help. Or your symptoms don't get better after 24 hours in the yellow zone.  Red zone peak flow (if you use a peak flow meter)   Less than _______ (less than 50% of your personal best)  Red zone actions (Check the boxes and fill in the blank spaces that apply.)  [ ]  Take _____ puff(s) of quick-relief medicine called ____________________________. Repeat ______ times.  [ ]  Begin or increase treatment with corticosteroid pills. Take ________ mg now.  [ ]  Call your doctor at this number: _________________. If you can't contact your doctor, go to the emergency department. Call 911 or ___________________.  [ ]  Other numbers you might call are: ___________________________________.  When should you call for help?  Call 911 anytime you think you may need emergency care. For example, call if:   You have severe trouble  breathing.  Call your doctor now or seek immediate medical care if:   You are in the red zone of your asthma action plan.   You've used your quick-relief medicine but are still having trouble breathing.   You cough up blood.   You have new or worse trouble breathing.   You cough up dark brown or bloody mucus (sputum).  Watch closely for changes in your health, and be sure to contact your doctor if:   You need to use quick-relief medicine more than 2 days each week (unless it's just for exercise).   Your coughing and wheezing get worse.  Follow-up care is a key part of your treatment and safety. Be sure to make and go to all appointments, and call your doctor if you are having problems. It's also a good idea to know your test results and keep a list of the medicines you take.  Where can you learn more?  Go to [email or web address] and sign in to your MyChart account. Enter H178 in the Search Health Information box to learn more about "Asthma: Your Action Plan."     If you do not have an account, please click on the "Sign Up Now" link.  Current as of: July 08, 2015  Content Version: 11.3   2006-2017 [employer], [employer]. Care instructions adapted under license by [facility]. If you have questions about a medical condition or this instruction, always ask your healthcare professional. [employer], [employer] disclaims any warranty or liability for your use of this information.         Asthma in Adults: Care Instructions  Your Care Instructions    During an asthma attack, your airways swell and narrow as a reaction to certain things (triggers). This makes it hard to breathe.  You may be able to prevent asthma attacks if you avoid the things that set off your asthma symptoms. Keeping your asthma under control and treating symptoms before they get bad can help you avoid severe attacks.  If you can control your asthma, you may be able to do all of your normal daily activities. You may also  avoid asthma attacks and trips to the hospital.  Follow-up care is a key part of your treatment and safety. Be sure to make and go to all appointments, and call your doctor if you are having problems. It's  also a good idea to know your test results and keep a list of the medicines you take.  How can you care for yourself at home?   Follow your asthma action plan so you can manage your symptoms at home. An asthma action plan will help you prevent and control airway reactions and will tell you what to do during an asthma attack. If you do not have an asthma action plan, work with your doctor to build one.   Take your asthma medicine exactly as prescribed. Medicine plays an important role in controlling asthma. Talk to your doctor right away if you have any questions about what to take and how to take it.   Use your quick-relief medicine when you have symptoms of an attack. Quick-relief medicine often is an albuterol inhaler. Some people need to use quick-relief medicine before they exercise.   Take your controller medicine every day, not just when you have symptoms. Controller medicine is usually an inhaled corticosteroid. The goal is to prevent problems before they occur. Do not use your controller medicine to try to treat an attack that has already started. It does not work fast enough to help.   If your doctor prescribed corticosteroid pills to use during an attack, take them as directed. They may take hours to work, but they may shorten the attack and help you breathe better.   Keep your quick-relief medicine with you at all times.   Talk to your doctor before using other medicines. Some medicines, such as aspirin, can cause asthma attacks in some people.   Check yourself for asthma symptoms to know which step to follow in your action plan. Watch for things like being short of breath, having chest tightness, coughing, and wheezing. Also notice if symptoms wake you up at night or if you get tired quickly  when you exercise.   If you have a peak flow meter, use it to check how well you are breathing. This can help you predict when an asthma attack is going to occur. Then you can take medicine to prevent the asthma attack or make it less severe.   See your doctor regularly. These visits will help you learn more about asthma and what you can do to control it. Your doctor will monitor your treatment to make sure the medicine is helping you.   Keep track of your asthma attacks and your treatment. After you have had an attack, write down what triggered it, what helped end it, and any concerns you have about your asthma action plan. Take your diary when you see your doctor. You can then review your asthma action plan and decide if it is working.   Do not smoke or allow others to smoke around you. Avoid smoky places. Smoking makes asthma worse. If you need help quitting, talk to your doctor about stop-smoking programs and medicines. These can increase your chances of quitting for good.   Learn what triggers an asthma attack for you, and avoid the triggers when you can. Common triggers include colds, smoke, air pollution, dust, pollen, mold, pets, cockroaches, stress, and cold air.   Avoid colds and the flu. Get a pneumococcal vaccine shot. If you have had one before, ask your doctor whether you need a second dose. Get a flu vaccine every fall. If you must be around people with colds or the flu, wash your hands often.  When should you call for help?  Call 911 anytime you think you may need emergency  care. For example, call if:   You have severe trouble breathing.  Call your doctor now or seek immediate medical care if:   Your symptoms do not get better after you have followed your asthma action plan.   You cough up yellow, dark brown, or bloody mucus (sputum).  Watch closely for changes in your health, and be sure to contact your doctor if:   Your coughing and wheezing get worse.   You need to use quick-relief  medicine on more than 2 days a week (unless it is just for exercise).   You need help figuring out what is triggering your asthma attacks.  Where can you learn more?  Go to [email or web address] and sign in to your MyChart account. Enter P597 in the Search Health Information box to learn more about "Asthma in Adults: Care Instructions."     If you do not have an account, please click on the "Sign Up Now" link.  Current as of: July 08, 2015  Content Version: 11.3   2006-2017 [employer], [employer]. Care instructions adapted under license by [facility]. If you have questions about a medical condition or this instruction, always ask your healthcare professional. [employer], Inc[employer] disclaims any warranty or liability for your use of this information.         Learning About Asthma  What is asthma?    Asthma is a long-term condition that affects your breathing. It causes the airways that lead to the lungs to swell.  People with asthma may have asthma attacks. During an asthma attack, the airways tighten and become narrower. This makes it hard to breathe, and you may wheeze or cough. If you have a bad asthma attack, you may need emergency care.  Asthma affects people in different ways. Some people only have asthma attacks during allergy season, or when they breathe in cold air, or when they exercise. Others have many bad attacks that send them to the doctor often.  What are the symptoms?  Symptoms of asthma can be mild or severe. You may have mild attacks now and then, you may have severe symptoms every day, or you may have something in between. How often you have symptoms can also change. When you have asthma, you may:   Wheeze, making a loud or soft whistling noise when you breathe in and out.   Cough a lot.   Feel tightness in your chest.   Feel short of breath.   Have trouble sleeping because of coughing or having a hard time breathing.   Get tired quickly during exercise.  Your  symptoms may be worse at night.  How can you prevent asthma attacks?  Certain things can make asthma symptoms worse. These are called triggers. When you are around a trigger, an asthma attack is more likely.  Common triggers include:   Cigarette smoke or air pollution.   Things you are allergic to, such as:   Pollen, mold, or dust mites.   Pet hair, skin, or saliva.   Illnesses, like colds, flu, or pneumonia.   Exercise.   Dry, cold air.  Here are some ways to avoid a few common triggers:   Do not smoke or allow others to smoke around you. If you need help quitting, talk to your doctor about stop-smoking programs and medicines. These can increase your chances of quitting for good.   If there is a lot of pollution, pollen, or dust outside, stay at home and keep your windows closed. Use an air  conditioner or air filter in your home. Check your local weather report or newspaper for air quality and pollen reports.   Get the flu vaccine every year. Talk to your doctor about getting a pneumococcal shot. Wash your hands often to prevent infections.   Avoid exercising outdoors in cold weather. If you are outdoors in cold weather, wear a scarf around your face and breathe through your nose.  How is asthma treated?  There are two parts to treating asthma, which are outlined in your asthma action plan. The goals are to:   Control asthma over the long term. The asthma action plan tells you which medicine you may need to take every day. This is called a controller medicine. It helps to reduce the swelling of the airways and prevent asthma attacks.   Treat asthma attacks when they occur. The asthma action plan tells you what to do when you have an asthma attack. It helps you identify triggers that can cause your attacks. You use quick-relief medicine during an attack.  The asthma plan also helps you track your symptoms and know how well the treatment is working.  Follow-up care is a key part of your treatment and  safety. Be sure to make and go to all appointments, and call your doctor if you are having problems. It's also a good idea to know your test results and keep a list of the medicines you take.  Where can you learn more?  Go to [email or web address] and sign in to your MyChart account. Enter [phone] in the Search Health Information box to learn more about "Learning About Asthma."     If you do not have an account, please click on the "Sign Up Now" link.  Current as of: July 08, 2015  Content Version: 11.3   2006-2017 [employer], [employer]. Care instructions adapted under license by [facility]. If you have questions about a medical condition or this instruction, always ask your healthcare professional. [employer], [employer] disclaims any warranty or liability for your use of this information.         Learning About COPD, Asthma, and Air Pollution  How does air pollution affect COPD and asthma?  When you have COPD or asthma, air pollution may make your symptoms worse. If it does, it means that air pollution is a trigger for you.  It is important to know what your triggers are and how to deal with them. If air pollution is a trigger for you, you need to learn about air quality and pay attention to weather forecasts that include how bad the air is expected to be.  How can you manage a flare-up caused by air pollution?   Do not panic. Quick treatment at home may help you prevent serious breathing problems.   Take your medicines exactly as your doctor tells you.   Use your quick-relief inhaler as directed by your doctor. If your symptoms do not get better after you use your medicine, have someone take you to the emergency room. Call an ambulance if necessary.   With inhaled medicines, a spacer or a nebulizer may help you get more medicine to your lungs. Ask your doctor or pharmacist how to use them properly. Practice using the spacer in front of a mirror before you have a flare-up. This may help  you get the medicine into your lungs quickly.   If your doctor has given you steroid pills, take them as directed.   Talk to your doctor if you have any  problems with your medicine.  What can you do to prevent flare-ups?   Try not to be outside when air pollution levels are high. Stay at home with your windows closed.   Do not smoke. This is the most important step you can take to prevent more damage to your lungs and prevent problems. If you already smoke, it is never too late to stop. If you need help quitting, talk to your doctor about stop-smoking programs and medicines. These can increase your chances of quitting for good.   Avoid secondhand smoke; cold, dry air; and high altitudes.   Take your daily medicines as prescribed.   Avoid colds and flu.   Get a pneumococcal vaccine.   Get a flu vaccine each year, as soon as it is available. Ask those you live or work with to do the same, so they will not get the flu and infect you.   Try to stay away from people with colds or the flu.   Wash your hands often.  Follow-up care is a key part of your treatment and safety. Be sure to make and go to all appointments, and call your doctor if you are having problems. It's also a good idea to know your test results and keep a list of the medicines you take.  Where can you learn more?  Go to [email or web address] and sign in to your MyChart account. Enter B312 in the Search Health Information box to learn more about "Learning About COPD, Asthma, and Air Pollution."     If you do not have an account, please click on the "Sign Up Now" link.  Current as of: July 08, 2015  Content Version: 11.3   2006-2017 [employer], [employer]. Care instructions adapted under license by [facility]. If you have questions about a medical condition or this instruction, always ask your healthcare professional. [employer] disclaims any warranty or liability for your use of this information.          COPD and Asthma: Care Instructions  Your Care Instructions  Some people who have chronic obstructive pulmonary disease (COPD) also have asthma. Both of these problems can damage your lungs. This makes it very important to control them.  Asthma causes the airways that lead to the lungs to swell and become narrow. This makes it hard to breathe. You may wheeze or cough. If you have a bad attack, you may need emergency care.  There are two parts to treating asthma.   Controlling asthma over the long term.   Treating attacks when they occur.  You and your doctor can make an asthma treatment plan that will help. This plan tells you the medicines you take every day to reduce the swelling in your airways and prevent attacks. It also tells you what to do if you have an asthma attack.  Follow-up care is a key part of your treatment and safety. Be sure to make and go to all appointments, and call your doctor if you are having problems. It's also a good idea to know your test results and keep a list of the medicines you take.  How can you care for yourself at home?  To control asthma over the long term  Medicines  Controller medicines reduce swelling in your lungs. They also prevent asthma attacks. Take your controller medicine exactly as prescribed. Talk to your doctor if you have any problems with your medicine.   Inhaled corticosteroid is a common and effective controller medicine. Using it the  right way can prevent or reduce most side effects.   Take your controller medicine every day, not just when you have symptoms. This helps prevent problems before they occur.   Always bring your asthma medicine with you when you travel.   Your doctor may prescribe long-acting medicine that combines a corticosteroid with a beta2-agonist. Follow your doctor's instructions exactly about how to take a long-acting medicine. Examples include:   Fluticasone and salmeterol (Advair).   Budesonide and formoterol (Symbicort).   Do not  depend on your controller medicines to stop an asthma attack that has already started. They do not work fast enough to help.   Your doctor may also prescribe anticholinergic inhalers. These include ipratropium (Atrovent) and tiotropium (Spiriva).  Education   Learn what sets off an asthma attack. Avoid these triggers when you can. Common triggers include smoke, air pollution, pollen, animal dander, colds, stress, and cold air.   Do not smoke. Smoking can make COPD and asthma worse. If you need help quitting, talk to your doctor about stop-smoking programs and medicines. These can increase your chances of quitting for good.   Check yourself for symptoms to know which step to follow in your action plan. Watch for things like being short of breath, having chest tightness, coughing, and wheezing. Also notice if symptoms wake you up at night or if you get tired quickly when you exercise.   You may want to learn how to use a peak flow meter. This measures how open your airways are. It may help you know when you will have an asthma attack.  To treat attacks when they occur  Use your asthma action plan when you have an attack. Your quick-relief medicine, such as albuterol, will stop an asthma attack. It relaxes the muscles that get tight around the airways.   Take your quick-relief medicine exactly as prescribed. Talk with your doctor if you have any problems with your medicine.   Keep this medicine with you at all times.   You may need to use this medicine before you exercise.  If your doctor prescribed corticosteroid pills to use during an attack, take them as directed. They may take hours to work, but they may shorten the attack and help you breathe better.  When should you call for help?  Call 911 anytime you think you may need emergency care. For example, call if:   You have severe trouble breathing.  Call your doctor now or seek immediate medical care if:   You have new or worse shortness of breath.   You  are coughing more deeply or more often, especially if you notice more mucus or a change in the color of your mucus.   You cough up blood.   You have new or increased swelling in your legs or belly.   You have a fever.   You have used your quick-relief medicine but you are still short of breath.  Watch closely for changes in your health, and be sure to contact your doctor if you have any problems.  Where can you learn more?  Go to [email or web address] and sign in to your MyChart account. Enter A350 in the Search Health Information box to learn more about "COPD and Asthma: Care Instructions."     If you do not have an account, please click on the "Sign Up Now" link.  Current as of: July 08, 2015  Content Version: 11.3   2006-2017 [employer]. Care instructions adapted under license by [facility]  [facility]. If you have questions about a medical condition or this instruction, always ask your healthcare professional. [employer], Inc[employer] disclaims any warranty or liability for your use of this information.

## 2016-01-04 NOTE — ED Provider Notes (Signed)
[facility] EMERGENCY DEPT  eMERGENCY dEPARTMENT eNCOUnter      Pt Name: Stephen Riley  MRN: [id number]  Birthdate Aug 29, 1965  Date of evaluation: 01/04/2016  Provider: Vanetta Mulders, MD     CHIEF COMPLAINT       Chief Complaint   Patient presents with   ??? Shortness of Breath         HISTORY OF PRESENT ILLNESS   (Location/Symptom, Timing/Onset, Context/Setting, Quality, Duration, Modifying Factors, Severity) Note limiting factors.   HPI    Stephen Riley is a 50 y.o. male who presents to the emergency department  gradual onset over the last 2 days of increased shortness of breath wheezing and cough productive of yellow sputum.  Denies fever nausea or vomiting or chest pain.  He has some inspiratory pain 2 out of 10 sharp which is relieved with rest     Nursing Notes were reviewed.    REVIEW OF SYSTEMS    (2+ for level 4; 10+ for level 5)   Review of Systems   Constitutional: Negative.    HENT: Positive for congestion.    Eyes: Negative.    Respiratory: Positive for cough, chest tightness, shortness of breath and wheezing.    Cardiovascular: Negative.    Gastrointestinal: Negative.    Genitourinary: Negative.    All other systems reviewed and are negative.      PAST MEDICAL HISTORY     Past Medical History:   Diagnosis Date   ??? Asthma    ??? GERD (gastroesophageal reflux disease)        SURGICAL HISTORY       Past Surgical History:   Procedure Laterality Date   ??? CARDIAC CATHETERIZATION      Unremarkable findings at [facility] X2   ??? ORBITAL FRACTURE SURGERY Left     remote   ??? UMBILICAL HERNIA REPAIR         CURRENT MEDICATIONS       Previous Medications    ALBUTEROL SULFATE HFA 108 (90 BASE) MCG/ACT INHALER    Inhale 2 puffs into the lungs every 6 hours as needed for Wheezing    ASPIRIN 81 MG TABLET    Take 81 mg by mouth daily    IBUPROFEN (ADVIL;MOTRIN) 200 MG TABLET    Take 800 mg by mouth every 6 hours as needed for Pain     PROCHLORPERAZINE (COMPAZINE) 10 MG TABLET    Take 1 tablet by mouth every 8 hours for 3 days        ALLERGIES     Review of patient's allergies indicates no known allergies.    FAMILY HISTORY       Family History   Problem Relation Age of Onset   ??? Asthma Mother    ??? No Known Problems Son    ??? No Known Problems Daughter         SOCIAL HISTORY       Social History     Social History   ??? Marital status: Legally Separated     Spouse name: N/A   ??? Number of children: N/A   ??? Years of education: N/A     Social History Main Topics   ??? Smoking status: Current Every Day Smoker     Packs/day: 0.50     Types: Cigarettes     Start date: 11/02/1985   ??? Smokeless tobacco: Never Used   ??? Alcohol use No   ??? Drug use: No   ??? Sexual  activity: Not on file     Other Topics Concern   ??? Not on file     Social History Narrative       SCREENINGS           PHYSICAL EXAM    (up to 7 for level 4, 8 or more for level 5)   ED Triage Vitals   BP Temp Temp Source Pulse Resp SpO2 Height Weight   01/04/16 0313 01/04/16 0313 01/04/16 0313 01/04/16 0313 01/04/16 0313 01/04/16 0313 01/04/16 0313 01/04/16 0313   107/72 97.9 ??F (36.6 ??C) Oral 71 18 100 % 6\' 3"  (1.905 m) 225 lb (102.1 kg)     Pulse oximetry 100% on room air interpretation normal  Physical Exam   Constitutional: He is oriented to person, place, and time. He appears well-developed and well-nourished.   HENT:   Head: Normocephalic and atraumatic.   Right Ear: External ear normal.   Left Ear: External ear normal.   Nose: Nose normal.   Mouth/Throat: Oropharynx is clear and moist.   Eyes: Conjunctivae and EOM are normal. Pupils are equal, round, and reactive to light.   Neck: Normal range of motion.   Cardiovascular: Normal rate, regular rhythm, normal heart sounds and intact distal pulses.    Pulmonary/Chest: Effort normal. No respiratory distress. He has wheezes. He has no rales. He exhibits no tenderness.   Abdominal: Soft. Bowel sounds are normal.   Musculoskeletal: Normal range of motion.   Neurological: He is alert and oriented to person, place, and time. No cranial nerve  deficit. Coordination normal.   Skin: Skin is warm and dry.   Psychiatric: He has a normal mood and affect. His behavior is normal.   Nursing note and vitals reviewed.      DIAGNOSTIC RESULTS     EKG (Per Emergency Physician):     RADIOLOGY (Per Emergency Physician):       Interpretation per the Radiologist below, if available at the time of this note:  Xr Chest Standard (2 Vw)    Result Date: 01/04/2016  Patient Name:  TAE, VONADA MRN:  [id number] FIN:  [id number] ---Diagnostic Radiology--- Exam Date/Time        01/04/2016 03:36:05 EDT                             Exam                  CR Chest PA/LAT                                     Ordering Physician    Hoy Register, MD, Shakeena Kafer A                                Accession Number      [id number]                                       CPT4 Codes 78469 () Reason For Exam wheezing Report CLINICAL INFORMATION: Wheezing. PA and lateral views of the chest are provided and compared with the previous study dated 11/27/2015. FINDINGS: The heart size is normal. The pulmonary arteries are enlarged consistent with chronic pulmonary hypertension. The lungs are  hyperexpanded but free of infiltrate or pleural effusion. The visualized bones and soft tissues are grossly unremarkable. IMPRESSION: 1. COPD with evidence of chronic pulmonary hypertension. 2. No focal infiltrates. 3. No significant change when compared to the previous study. Report Dictated on Workstation: [id number] ---** Final ---** Dictated: 01/04/2016 3:34 am Dictating Physician: Gerhard Munch, MD, JEFFREY Signed Date and Time: 01/04/2016 3:37 am Signed by: Gerhard Munch, MD, JEFFREY Transcribed Date and Time: 01/04/2016 3:34      ED BEDSIDE ULTRASOUND:   Performed by ED Physician - none    LABS:  Labs Reviewed - No data to display     All other labs were within normal range or not returned as of this dictation.    EMERGENCY DEPARTMENT COURSE and DIFFERENTIAL DIAGNOSIS/MDM:   Vitals:    Vitals:    01/04/16 0313 01/04/16 0332  01/04/16 0421 01/04/16 0422   BP: 107/72  123/68    Pulse: 71  69    Resp: 18  14    Temp: 97.9 ??F (36.6 ??C)      TempSrc: Oral      SpO2: 100% 99% (!) 88% 99%   Weight: 102.1 kg (225 lb)      Height: 6\' 3"  (1.905 m)          Medications   ipratropium-albuterol (DUONEB) nebulizer solution 1 ampule (1 ampule Inhalation Given 01/04/16 0350)   predniSONE (DELTASONE) tablet 60 mg (60 mg Oral Given 01/04/16 0425)       MDM.      REVAL:     450 a.m. patient is feeling better and less wheezing no cough and is ready for discharge and requesting work note    CRITICAL CARE TIME   Total Critical Care time was  minutes, excluding separately reportable procedures.  There was a high probability of clinically significant/life threatening deterioration in the patient's condition which required my urgent intervention.     CONSULTS:  None    PROCEDURES:  Unless otherwise noted below, none     Procedures    FINAL IMPRESSION      1. Asthma exacerbation          DISPOSITION/PLAN   DISPOSITION Decision to Discharge    PATIENT REFERRED TO:  [facility] Emergency Dept  [address]  [place] [address]  [phone]    As needed, If symptoms worsen    Argie Ramming, DO  [address]  [address]  [place] [address]  [phone]    In 2 days        DISCHARGE MEDICATIONS:  New Prescriptions    PREDNISONE (DELTASONE) 20 MG TABLET    Take 3 tablets by mouth daily for 5 days          (Please note:  Portions of this note were completed with a voice recognition program.  Efforts were made to edit the dictations but occasionally words and phrases are mis-transcribed.)  Form v2016.J.5-cn    Vanetta Mulders, MD (electronically signed)  Emergency Medicine Provider     Vanetta Mulders, MD  01/04/16 [phone]

## 2016-01-04 NOTE — ED Notes (Signed)
Bed: 45  Expected date:   Expected time:   Means of arrival:   Comments:  Triage: Peral, Breathing problems     Lendon Collar, RN  01/04/16 [phone]

## 2016-01-10 ENCOUNTER — Ambulatory Visit
Admit: 2016-01-10 | Discharge: 2016-01-10 | Payer: PRIVATE HEALTH INSURANCE | Attending: Internal Medicine | Primary: Internal Medicine

## 2016-01-10 DIAGNOSIS — J4530 Mild persistent asthma, uncomplicated: Secondary | ICD-10-CM

## 2016-01-10 MED ORDER — SILDENAFIL CITRATE 20 MG PO TABS
20 MG | ORAL_TABLET | Freq: Every day | ORAL | 2 refills | Status: DC | PRN
Start: 2016-01-10 — End: 2016-06-14

## 2016-01-10 MED ORDER — IBUPROFEN 200 MG PO TABS
200 | ORAL_TABLET | Freq: Four times a day (QID) | ORAL | 1 refills | Status: DC | PRN
Start: 2016-01-10 — End: 2016-10-12

## 2016-01-10 MED ORDER — SUMATRIPTAN SUCCINATE 50 MG PO TABS
50 MG | ORAL_TABLET | ORAL | 3 refills | Status: DC
Start: 2016-01-10 — End: 2016-02-01
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
## 2016-02-01 ENCOUNTER — Ambulatory Visit
Admit: 2016-02-01 | Discharge: 2016-02-01 | Payer: PRIVATE HEALTH INSURANCE | Attending: Surgery | Primary: Internal Medicine

## 2016-02-01 DIAGNOSIS — S70351A Superficial foreign body, right thigh, initial encounter: Secondary | ICD-10-CM
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
## 2016-02-06 ENCOUNTER — Encounter: Attending: Internal Medicine | Primary: Internal Medicine

## 2016-02-12 ENCOUNTER — Encounter: Primary: Internal Medicine

## 2016-02-12 ENCOUNTER — Inpatient Hospital Stay: Attending: Internal Medicine | Primary: Internal Medicine

## 2016-02-12 NOTE — Other (Unsigned)
Patient Acct Nbr: [id number]   Primary AUTH/CERT:   Primary Insurance Company Name: [employer]  Primary Insurance Plan name: [employer]  Primary Insurance Group Number:   Primary Insurance Plan Type: Health  Primary Insurance Policy Number: [id number]

## 2016-02-26 ENCOUNTER — Inpatient Hospital Stay
Admit: 2016-02-26 | Discharge: 2016-02-26 | Disposition: A | Discharge status: Home / Self Care | Attending: Emergency Medicine

## 2016-02-26 DIAGNOSIS — J441 Chronic obstructive pulmonary disease with (acute) exacerbation: Secondary | ICD-10-CM

## 2016-02-26 MED ORDER — ALBUTEROL SULFATE (2.5 MG/3ML) 0.083% IN NEBU
RESPIRATORY_TRACT | Status: AC
Start: 2016-02-26 — End: 2016-02-26
  Administered 2016-02-26 (×3): 2.5 mg via RESPIRATORY_TRACT

## 2016-02-26 MED ORDER — PREDNISONE 20 MG PO TABS
20 MG | Freq: Once | ORAL | Status: AC
Start: 2016-02-26 — End: 2016-02-26
  Administered 2016-02-26: 19:00:00 60 mg via ORAL

## 2016-02-26 MED ORDER — IPRATROPIUM BROMIDE 0.02 % IN SOLN
0.02 % | Freq: Once | RESPIRATORY_TRACT | Status: AC
Start: 2016-02-26 — End: 2016-02-26
  Administered 2016-02-26: 18:00:00 0.5 mg via RESPIRATORY_TRACT

## 2016-02-26 MED ORDER — PREDNISONE 10 MG PO TABS
10 MG | ORAL_TABLET | Freq: Every day | ORAL | 0 refills | Status: AC
Start: 2016-02-26 — End: 2016-02-29

## 2016-02-26 MED FILL — PREDNISONE 20 MG PO TABS: 20 MG | ORAL | Qty: 3

## 2016-02-26 NOTE — ED Triage Notes (Signed)
Pt here to the ER c/o SOB for the past couple days.  Pt states he has a hx of asthma.

## 2016-02-26 NOTE — ED Provider Notes (Signed)
Emergency Department Encounter  [facility] EMERGENCY DEPT    Patient: Stephen Riley  MRN: [id number]  DOB: 02-29-1966  Date of Evaluation: 02/26/2016  ED APP Provider: Devra DoppBeth A Kingzett, NP    ED care was supervised by Dr. Marlane MingleMohan who independently examined and evaluated the patient. Please see their attestation note for further details.    Chief Complaint       Chief Complaint   Patient presents with   ??? Shortness of Breath     HOPI     Stephen NeatVictor V Overdorf is a 50 y.o. male who presents to the emergency department With complaints of cough. Patient states that his asthma is flaring up over the last 2 days they were doing some increased testing around the work area and patient states that the vent posterior off. He states that he has had to use his inhaler couple times today. Patient states he is a nonsmoker just quit 2 days ago. Patient states he has also smoked for 38 years.    ROS:     Review of Systems   Constitutional: Negative.  Negative for chills and fever.   HENT: Negative.  Negative for congestion, ear pain and sore throat.    Eyes: Negative.  Negative for visual disturbance.   Respiratory: Positive for cough and shortness of breath. Negative for chest tightness.    Cardiovascular: Negative.    Gastrointestinal: Negative.  Negative for abdominal distention, abdominal pain and nausea.   Endocrine: Negative.    Genitourinary: Negative.  Negative for dysuria, flank pain and frequency.   Musculoskeletal: Negative.    Skin: Negative for color change and rash.   Allergic/Immunologic: Negative.    Neurological: Negative.  Negative for dizziness, weakness, numbness and headaches.   Hematological: Negative.    Psychiatric/Behavioral: Negative.      At least 10 systems reviewed and otherwise acutely negative except as in the HOPI.    Past History     Past Medical History:   Diagnosis Date   ??? Asthma    ??? Chronic back pain    ??? Erectile dysfunction    ??? GERD (gastroesophageal reflux disease)    ??? Headache      Past Surgical  History:   Procedure Laterality Date   ??? CARDIAC CATHETERIZATION      Unremarkable findings at [facility] X2   ??? HAND SURGERY Right    ??? HERNIA REPAIR     ??? ORBITAL FRACTURE SURGERY Left     remote   ??? UMBILICAL HERNIA REPAIR       Social History     Social History   ??? Marital status: Legally Separated     Spouse name: N/A   ??? Number of children: N/A   ??? Years of education: N/A     Social History Main Topics   ??? Smoking status: Current Every Day Smoker     Packs/day: 0.50     Types: Cigarettes     Start date: 11/02/1985   ??? Smokeless tobacco: Never Used   ??? Alcohol use No   ??? Drug use: No   ??? Sexual activity: Not on file     Other Topics Concern   ??? Not on file     Social History Narrative   ??? No narrative on file       Medications/Allergies     Discharge Medication List as of 02/26/2016  2:04 PM      CONTINUE these medications which have NOT CHANGED  Details   ibuprofen (ADVIL;MOTRIN) 200 MG tablet Take 4 tablets by mouth every 6 hours as needed for Pain, Disp-120 tablet, R-1Normal      sildenafil (REVATIO) 20 MG tablet Take 1 tablet by mouth daily as needed (ED), Disp-10 tablet, R-2Normal      albuterol sulfate HFA 108 (90 Base) MCG/ACT inhaler Inhale 2 puffs into the lungs every 6 hours as needed for Wheezing, Disp-1 Inhaler, R-3Normal           No Known Allergies     Physical Exam       ED Triage Vitals [02/26/16 1214]   BP Temp Temp Source Pulse Resp SpO2 Height Weight   124/74 98.4 ??F (36.9 ??C) Oral 85 18 96 % 6\' 3"  (1.905 m) 225 lb (102.1 kg)     Physical Exam   Constitutional: He is oriented to person, place, and time. He appears well-developed and well-nourished.   HENT:   Head: Normocephalic and atraumatic.   Eyes: Pupils are equal, round, and reactive to light.   Neck: Normal range of motion. Neck supple.   Cardiovascular: Normal rate and normal heart sounds.    Pulmonary/Chest: Effort normal and breath sounds normal. No accessory muscle usage. No respiratory distress. He has no wheezes. He has no rales.    Abdominal: Soft. Bowel sounds are normal.   Musculoskeletal: Normal range of motion.   Neurological: He is alert and oriented to person, place, and time.   Skin: Skin is warm and dry.   Psychiatric: He has a normal mood and affect.   Vitals reviewed.        Diagnostics   Labs:  No results found for this visit on 02/26/16.  Radiographs:  No results found.    Procedures:       EKG: All EKG's are interpreted by the Emergency Department Physician in the absence of a cardiologist.?? Please see their note for interpretation of EKG.    ED Course and MDM           In brief, Stephen NeatVictor V Sauber is a 50 y.o. male who presented to the emergency department With complaints of cough over the last 2 days. Patient states he has asthma and he is a smoker. He states that worked they were doing increased dusting and they have the ventilation system turned off. He states that he is breathing and more dust and it is irritating to his asthma. He states he has been using his inhaler Pro Air inhaler 2-3 times a day. With minimal relief. Patient states he is not having any difficulty sleeping at night. Patient states he recently quit smoking 2 days ago. In that he has smoked for the last 38 years. States he has occasional yellow sputum when he coughs. Lung sounds are clear throughout diminished bases. Patient will be ordered chest x-ray, upper ureteral aerosol treatments with 1 DuoNeb. Plan of care was discussed with Dr. Marlane MingleMohan. 1230 on a further evaluation Dr. Marlane MingleMohan added on oral prednisone for patient due to history of smoking. She states he has an extended expiratory. 1350 patient's chest x-ray negative for consolidation and or effusion or pneumothorax. Patient will be sent home with oral prednisone taper. plan Of care discussed with Dr. Marlane MingleMohan.    ED Medication Orders     Start Ordered     Status Ordering Provider    02/26/16 1230 02/26/16 1227  predniSONE (DELTASONE) tablet 60 mg  ONCE      Last MAR action:  Given - by  CUTRIGHT, MARISA J.  on 02/26/16 at 1426 [facility]    02/26/16 1200 02/26/16 1159  albuterol (PROVENTIL) nebulizer solution 2.5 mg  EVERY 15 MIN      Last MAR action:  Given - by Urban Gibson on 02/26/16 at 1306 KINGZETT, Janard Culp A    02/26/16 1200 02/26/16 1159  ipratropium (ATROVENT) 0.02 % nebulizer solution 0.5 mg  ONCE      Last MAR action:  Given - by Urban Gibson on 02/26/16 at 1243 KINGZETT, Emelda Kohlbeck A          Final Impression      1. COPD exacerbation (HCC)        DISPOSITION    (Please note that portions of this note may have been completed with a voice recognition program. Efforts were made to edit the dictations but occasionally words are mis-transcribed.)    Devra Dopp, NP  [employer]     Devra Dopp, NP  02/28/16 [phone]

## 2016-02-26 NOTE — Other (Unsigned)
Patient Acct Nbr: [id number]   Primary AUTH/CERT:   Primary Insurance Company Name: [employer]  Primary Insurance Plan name: [employer]  Primary Insurance Group Number:   Primary Insurance Plan Type: Health  Primary Insurance Policy Number: [id number]

## 2016-02-26 NOTE — ED Provider Notes (Signed)
I have seen this patient in conjunction with APP, Beth Kingzett  I independently examined and evaluated Netta NeatVictor V Winer.    In brief, patient is coming in with cough and wheezing for the past 2 days. He states that they were doing a lot of cleaning at work and the dust exposure seems to have cleared him up. He states normally he rarely uses his inhaler but has required it about twice a day in the past week. He denies having sputum production, chest pain, leg swelling, fevers.     Focused exam revealed no obvious wheezing on lung exam but he does have a prolonged extremities. No leg swelling. No tachypnea.    ECG Interpretation:  None    ED course:   We will give the patient albuterol and ipratropium here. We have also given him a dose of prednisone. He is likely having a mild COPD exacerbation.    Chest x-ray was obtained due to the dust exposure to ensure that there is no evidence of pneumonitis or pneumonia.    Patient's feeling of being treatment we will send him home with a burst of steroids. We have asked him to follow up with his primary care physician.    All diagnostic, treatment, and disposition decisions were made by myself in conjunction with the advanced practice provider.    For all further details of the patient's emergency department visit, please see the advanced practice provider's documentation.    Comment: Please note this report has been produced using speech recognition software and may contain errors related to that system including errors in grammar, punctuation, and spelling, as well as words and phrases that may be inappropriate. If there are any questions or concerns please feel free to contact the dictating provider for clarification.        Ashleen Demma D. Marlane MingleMohan, MD  ------------------------------  Eldridge Daceevina D. Marlane MingleMohan, MD        Phylis Bougieevina Liesl Simons, MD  02/27/16 [phone]

## 2016-02-27 NOTE — Telephone Encounter (Signed)
Patient was in ED 02/26/16 for breathing issues. Attempted to contact but number on file only rang busy

## 2016-03-25 ENCOUNTER — Encounter: Attending: Internal Medicine | Primary: Internal Medicine

## 2016-04-22 ENCOUNTER — Encounter: Attending: Internal Medicine | Primary: Internal Medicine

## 2016-04-29 ENCOUNTER — Encounter: Attending: Internal Medicine | Primary: Internal Medicine

## 2016-04-29 NOTE — Telephone Encounter (Signed)
[name] has same day cancelled the last three appts and cancelled the last four appts overall. He is rescheduled for 2/5. Please remind him of the same day cancellation and no-show policy. I am still happy to see him on 2/5.

## 2016-04-30 NOTE — Telephone Encounter (Signed)
Called pt lvm

## 2016-05-20 ENCOUNTER — Encounter: Attending: Internal Medicine | Primary: Internal Medicine

## 2016-05-20 NOTE — Telephone Encounter (Signed)
lmtcb to r/s missed appt

## 2016-05-21 NOTE — Telephone Encounter (Signed)
lmtcb

## 2016-05-22 NOTE — Telephone Encounter (Signed)
lmtcb sending letter

## 2016-06-14 ENCOUNTER — Ambulatory Visit
Admit: 2016-06-14 | Discharge: 2016-06-14 | Payer: BLUE CROSS/BLUE SHIELD | Attending: Internal Medicine | Primary: Internal Medicine

## 2016-06-14 DIAGNOSIS — J40 Bronchitis, not specified as acute or chronic: Secondary | ICD-10-CM

## 2016-06-14 MED ORDER — BENZONATATE 100 MG PO CAPS
100 MG | ORAL_CAPSULE | Freq: Three times a day (TID) | ORAL | 0 refills | Status: AC | PRN
Start: 2016-06-14 — End: 2016-06-21

## 2016-06-14 MED ORDER — ALBUTEROL SULFATE HFA 108 (90 BASE) MCG/ACT IN AERS
108 | Freq: Four times a day (QID) | RESPIRATORY_TRACT | 3 refills | Status: DC | PRN
Start: 2016-06-14 — End: 2017-02-25

## 2016-06-14 MED ORDER — VARENICLINE TARTRATE 0.5 MG X 11 & 1 MG X 42 PO MISC
0.5 MG X 11 & 1 MG X 42 | ORAL_TABLET | ORAL | 0 refills | Status: DC
Start: 2016-06-14 — End: 2017-02-25

## 2016-06-14 MED ORDER — PREDNISONE 20 MG PO TABS
20 MG | ORAL_TABLET | Freq: Every day | ORAL | 0 refills | Status: AC
Start: 2016-06-14 — End: 2016-06-19

## 2016-06-14 MED ORDER — VARENICLINE TARTRATE 1 MG PO TABS
1 MG | ORAL_TABLET | Freq: Two times a day (BID) | ORAL | 3 refills | Status: DC
Start: 2016-06-14 — End: 2017-02-25

## 2016-06-14 NOTE — Progress Notes (Signed)
Subjective:     Patient: Stephen Riley is a 51 y.o. male    Stephen Riley comes for a same day visit and was late so will need to expedite the visit.     He has been wheezing a lot at night, coughing the last 2-3 weeks. Cough was productive last week but now lightening up. Has had chest congestion and headaches. Had fevers/chills at the outset but not now. There was rhinorrhea and sore throat at the outset. Has been using alka-seltzer, which helped some. Has used the albuterol inhaler about 3 times per week. He notes a history of asthma. Still smokes but has decreased to 5 cigs per day. Had some success with Chantix in the  Past but got bad dreams.  PFTs were scheduled last fall but he no-showed.    Never scheduled his colonoscopy.          Review of Systems   Constitutional: Negative for chills and fever.   HENT: Positive for congestion.    Respiratory: Positive for cough, shortness of breath and wheezing.    Neurological: Positive for headaches.        No Known Allergies  Current Outpatient Prescriptions on File Prior to Visit   Medication Sig Dispense Refill   ??? ibuprofen (ADVIL;MOTRIN) 200 MG tablet Take 4 tablets by mouth every 6 hours as needed for Pain 120 tablet 1     No current facility-administered medications on file prior to visit.       Patient Active Problem List   Diagnosis   ??? GERD (gastroesophageal reflux disease)   ??? Erectile dysfunction   ??? Chronic back pain   ??? Asthma      Social History   Substance Use Topics   ??? Smoking status: Current Every Day Smoker     Packs/day: 0.50     Types: Cigarettes     Start date: 11/02/1985   ??? Smokeless tobacco: Never Used   ??? Alcohol use No      Family History   Problem Relation Age of Onset   ??? Asthma Mother    ??? No Known Problems Son    ??? No Known Problems Daughter    ??? Breast Cancer Maternal Aunt    ??? Breast Cancer Maternal Uncle      throat   ??? Breast Cancer Maternal Grandfather      pancreatic   ??? Breast Cancer Maternal Uncle          Objective:     BP 123/83     Pulse 85    Temp 98.8 ??F (37.1 ??C)    Wt 219 lb (99.3 kg)    SpO2 98%    BMI 27.37 kg/m??     Physical Exam   Constitutional: He appears well-developed.   Cardiovascular: Normal rate, regular rhythm and normal heart sounds.  Exam reveals no gallop and no friction rub.    No murmur heard.  Pulmonary/Chest: Effort normal. He has wheezes.   Anterior wheezing   Abdominal: Soft. Bowel sounds are normal.   Musculoskeletal: He exhibits no edema.   Skin: Skin is warm and dry.   Psychiatric: He has a normal mood and affect. His behavior is normal. Judgment and thought content normal.       Assessment/Plan      1. Bronchitis  - predniSONE (DELTASONE) 20 MG tablet; Take 2 tablets by mouth daily for 5 days  Dispense: 10 tablet; Refill: 0  - benzonatate (TESSALON PERLES) 100 MG capsule;  Take 1 capsule by mouth 3 times daily as needed for Cough  Dispense: 30 capsule; Refill: 0  Call if not improving after a week.    2. Mild intermittent asthma without complication  - FULL PFT STUDY WITH PRE AND POST; Future  - albuterol sulfate HFA 108 (90 Base) MCG/ACT inhaler; Inhale 2 puffs into the lungs every 6 hours as needed for Wheezing  Dispense: 1 Inhaler; Refill: 3  Will reorder PFTS and stressed the importance to diagnostic accuracy and proper treatment.     3. Tobacco abuse  - varenicline (CHANTIX CONTINUING MONTH PAK) 1 MG tablet; Take 1 tablet by mouth 2 times daily  Dispense: 60 tablet; Refill: 3  - varenicline (CHANTIX STARTING MONTH PAK) 0.5 MG X 11 & 1 MG X 42 tablet; Take by mouth.  Dispense: 53 tablet; Refill: 0  Discussed the importance of cessation and he would like to retry Chantix. Discussed setting a quit date.     4. Colon cancer screening  - [facility] - Loann Quill, MD  New referral sent for [facility] since he had trouble getting in with [facility].    He's had multiple no-shows and needs to come for follow-up in 6 weeks.       Argie Ramming, DO  06/14/16  2:16 PM

## 2016-06-20 NOTE — Telephone Encounter (Signed)
Letter has been mailed to pt to call our office to schedule a screening colonoscopy.    Thank you

## 2016-07-29 ENCOUNTER — Encounter: Attending: Internal Medicine | Primary: Internal Medicine

## 2016-07-30 NOTE — Telephone Encounter (Signed)
Tried to call the pt two time was hung up on both times. Sent no show letter

## 2016-10-12 ENCOUNTER — Inpatient Hospital Stay: Admit: 2016-10-12 | Discharge: 2016-10-13 | Disposition: A | Discharge status: Home / Self Care

## 2016-10-12 DIAGNOSIS — S63501A Unspecified sprain of right wrist, initial encounter: Secondary | ICD-10-CM

## 2016-10-12 MED ORDER — TETANUS-DIPHTH-ACELL PERTUSSIS 5-2.5-18.5 LF-MCG/0.5 IM SUSP
Freq: Once | INTRAMUSCULAR | Status: AC
Start: 2016-10-12 — End: 2016-10-12
  Administered 2016-10-13: 0.5 mL via INTRAMUSCULAR

## 2016-10-12 MED ORDER — BACITRACIN 500 UNIT/GM EX OINT
500 UNIT/GM | Freq: Once | CUTANEOUS | Status: AC
Start: 2016-10-12 — End: 2016-10-12
  Administered 2016-10-13: via TOPICAL

## 2016-10-12 MED ORDER — ACETAMINOPHEN 500 MG PO TABS
500 MG | Freq: Once | ORAL | Status: AC
Start: 2016-10-12 — End: 2016-10-12
  Administered 2016-10-13: 1000 mg via ORAL

## 2016-10-12 MED ORDER — IBUPROFEN 600 MG PO TABS
600 MG | Freq: Once | ORAL | Status: AC
Start: 2016-10-12 — End: 2016-10-12
  Administered 2016-10-13: 600 mg via ORAL

## 2016-10-12 NOTE — Other (Unsigned)
Patient Acct Nbr: [id number]   Primary AUTH/CERT:   Primary Insurance Company Name: [employer]  Primary Insurance Plan name: [facility]  Primary Insurance Group Number:   Primary Insurance Plan Type: Health  Primary Insurance Policy Number: [id number]
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
## 2016-10-12 NOTE — ED Notes (Signed)
Cock-up wrist splint applied per verbal order from Duwayne Heckanielle, NP. Pt was given ice for wrist. D/c instructions given. Pt verbalizes understanding and denies any further questions or needs at time of d/c. Pt was ambulatory from ED without difficulty or assistance.     Lashelle Koy D. Pete GlatterHolley, RN  10/12/16 2030

## 2016-10-13 MED ORDER — IBUPROFEN 600 MG PO TABS
600 MG | ORAL_TABLET | Freq: Four times a day (QID) | ORAL | 0 refills | Status: DC | PRN
Start: 2016-10-13 — End: 2017-02-18

## 2016-10-13 MED FILL — BACITRACIN 500 UNIT/GM EX OINT: 500 UNIT/GM | CUTANEOUS | Qty: 2

## 2016-10-13 MED FILL — MAPAP 500 MG PO TABS: 500 MG | ORAL | Qty: 2

## 2016-10-13 MED FILL — IBUPROFEN 600 MG PO TABS: 600 MG | ORAL | Qty: 1

## 2016-10-13 MED FILL — BOOSTRIX 5-2.5-18.5 IM SUSP: INTRAMUSCULAR | Qty: 0.5

## 2016-10-18 NOTE — Telephone Encounter (Signed)
Pt seen in ED 10/12/2016 wrist pain     ED message sent via mychart

## 2016-10-29 DIAGNOSIS — J45901 Unspecified asthma with (acute) exacerbation: Secondary | ICD-10-CM

## 2016-10-29 NOTE — Other (Unsigned)
Patient Acct Nbr: [id number]   Primary AUTH/CERT:   Primary Insurance Company Name: [employer]  Primary Insurance Plan name: [employer] [employer]  Primary Insurance Group Number:   Primary Insurance Plan Type: Health  Primary Insurance Policy Number: [id number]

## 2016-10-29 NOTE — ED Provider Notes (Signed)
[facility] ED  eMERGENCY dEPARTMENT eNCOUnter      Pt Name: Stephen Riley  MRN: [id number]  Birthdate November 23, 1965  Date of evaluation: 10/29/2016  Provider: Jakai Risse Reymundo Poll, PA     CHIEF COMPLAINT       Chief Complaint   Patient presents with   ??? Asthma         HISTORY OF PRESENT ILLNESS   (Location/Symptom, Timing/Onset, Context/Setting, Quality, Duration, Modifying Factors, Severity) Note limiting factors.   HPI    Stephen Riley is a 51 y.o. male who presents to the emergency department Complaining of an exacerbation of his asthma over the past 2-3 days.  He has had a nonproductive cough.  He states he has an inhaler at home but he has not been home to use it.  He states he has plenty of medicine left his inhaler.  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     Nursing Notes were reviewed.    REVIEW OF SYSTEMS    (2+ for level 4; 10+ for level 5)     Review of Systems   Constitutional: Negative for chills, diaphoresis and fever.   HENT: Negative for congestion, ear pain, facial swelling, rhinorrhea and sore throat.    Eyes: Negative for photophobia, pain and visual disturbance.   Respiratory: Positive for cough, shortness of breath and wheezing. Negative for chest tightness.    Cardiovascular: Negative for chest pain and leg swelling.   Gastrointestinal: Negative for abdominal pain, constipation, diarrhea, nausea and vomiting.   Genitourinary: Negative for difficulty urinating, dysuria, flank pain, frequency, hematuria and urgency.   Musculoskeletal: Negative for arthralgias, back pain, myalgias and neck pain.   Skin: Negative for rash.   Neurological: Negative for dizziness and headaches.   Psychiatric/Behavioral: Negative for suicidal ideas.       PAST MEDICAL HISTORY     Past Medical History:   Diagnosis Date   ??? Asthma    ??? Chronic back pain    ??? Erectile dysfunction    ??? GERD (gastroesophageal reflux disease)    ??? Headache        SURGICAL HISTORY       Past Surgical History:    Procedure Laterality Date   ??? CARDIAC CATHETERIZATION      Unremarkable findings at [facility] X2   ??? HAND SURGERY Right    ??? HERNIA REPAIR     ??? ORBITAL FRACTURE SURGERY Left     remote   ??? UMBILICAL HERNIA REPAIR         CURRENT MEDICATIONS       Previous Medications    ALBUTEROL SULFATE HFA 108 (90 BASE) MCG/ACT INHALER    Inhale 2 puffs into the lungs every 6 hours as needed for Wheezing    IBUPROFEN (ADVIL;MOTRIN) 600 MG TABLET    Take 1 tablet by mouth every 6 hours as needed for Pain    VARENICLINE (CHANTIX CONTINUING MONTH PAK) 1 MG TABLET    Take 1 tablet by mouth 2 times daily    VARENICLINE (CHANTIX STARTING MONTH PAK) 0.5 MG X 11 & 1 MG X 42 TABLET    Take by mouth.       ALLERGIES     Patient has no known allergies.    FAMILY HISTORY       Family History   Problem Relation Age of Onset   ??? Asthma Mother    ??? No Known Problems Son    ???  No Known Problems Daughter    ??? Breast Cancer Maternal Aunt    ??? Breast Cancer Maternal Uncle         throat   ??? Breast Cancer Maternal Grandfather         pancreatic   ??? Breast Cancer Maternal Uncle           SOCIAL HISTORY       Social History     Social History   ??? Marital status: Legally Separated     Spouse name: N/A   ??? Number of children: N/A   ??? Years of education: N/A     Social History Main Topics   ??? Smoking status: Current Every Day Smoker     Packs/day: 0.50     Types: Cigarettes     Start date: 11/02/1985   ??? Smokeless tobacco: Never Used   ??? Alcohol use No   ??? Drug use: No   ??? Sexual activity: Not Asked     Other Topics Concern   ??? None     Social History Narrative   ??? None       SCREENINGS           PHYSICAL EXAM    (5+ for level 4, 8+ for level 5)     ED Triage Vitals [10/29/16 2157]   BP Temp Temp src Pulse Resp SpO2 Height Weight   (!) 147/85 98.2 ??F (36.8 ??C) -- 63 16 97 % -- --       Physical Exam   Constitutional: He is oriented to person, place, and time. He appears well-developed and well-nourished. No distress.   HENT:   Head: Normocephalic.   Eyes:  Conjunctivae are normal.   Neck: Normal range of motion. Neck supple.   Cardiovascular: Normal rate and regular rhythm.  Exam reveals no gallop and no friction rub.    No murmur heard.  Pulmonary/Chest: Effort normal and breath sounds normal. He has no wheezes. He has no rales.   Musculoskeletal: Normal range of motion.   Neurological: He is alert and oriented to person, place, and time.   Skin: Skin is warm and dry. No rash noted. He is not diaphoretic.   Psychiatric: He has a normal mood and affect. His behavior is normal. Judgment and thought content normal.   Nursing note and vitals reviewed.      DIAGNOSTIC RESULTS     EKG (Per Emergency Physician):       RADIOLOGY (Per Emergency Physician):       Interpretation per the Radiologist below, if available at the time of this note:  No results found.    LABS:  Labs Reviewed - No data to display    All other labs were within normal range or not returned as of this dictation.    EMERGENCY DEPARTMENT COURSE and DIFFERENTIAL DIAGNOSIS/MDM:   Vitals:    Vitals:    10/29/16 2157   BP: (!) 147/85   Pulse: 63   Resp: 16   Temp: 98.2 ??F (36.8 ??C)   SpO2: 97%       Medications   ipratropium-albuterol (DUONEB) nebulizer solution 1 ampule (not administered)   predniSONE (DELTASONE) tablet 60 mg (not administered)       MDM.  The patient's lung sounds were clear.  He will be given a DuoNeb aerosol treatment plus p.o. prednisone.  He will be discharged with home going instruction sheet on asthma.  He will be given a prescription for prednisone and  he is advised to go home and get his inhaler and use as directed.  He is to follow-up with his private physician for recheck in 2-3 days.  He can return should signs and symptoms worsen in anyway or any other concerns develop.  The patient expressed an understanding to verbal instructions and in no further questions at the time of discharge.    I independently evaluated this patient with the emergency Department physician on site and  available at all times if needed.    CONSULTS:  None    PROCEDURES:  Unless otherwise noted below, none     Procedures    FINAL IMPRESSION      1. Asthma exacerbation, mild          DISPOSITION/PLAN   DISPOSITION Decision To Discharge 10/29/2016 10:11:13 PM      PATIENT REFERRED TO:  Argie Ramminghristopher L Carmichael, DO  [address]  [address]  [place] [id number]  [phone]    Schedule an appointment as soon as possible for a visit         DISCHARGE MEDICATIONS:  New Prescriptions    PREDNISONE (DELTASONE) 10 MG TABLET    Take 4 tablets by mouth once daily for 5 days          (Please note:  Portions of this note were completed with a voice recognition program.  Efforts were made to edit the dictations but occasionally words and phrases are mis-transcribed.)    Form v2016.J.[id number]    Lenka Zhao Reymundo PollL Livio Ledwith, PA (electronically signed)  Emergency Medicine Provider           Richrd SoxAmy L Twylia Oka, PA  10/29/16 2213

## 2016-10-29 NOTE — ED Triage Notes (Signed)
Pt c/o wheezing today. States hasn't been home to use his inhaler. Respirations unlabored, no audible wheezes.

## 2016-10-29 NOTE — ED Notes (Signed)
Pt c/o asthma flair up , cough and wheezing     Augusto GambleMaureen A Efosa Treichler, RN  10/29/16 2303

## 2016-10-30 ENCOUNTER — Inpatient Hospital Stay: Admit: 2016-10-30 | Discharge: 2016-10-30 | Disposition: A | Discharge status: Home / Self Care

## 2016-10-30 MED ORDER — IPRATROPIUM-ALBUTEROL 0.5-2.5 (3) MG/3ML IN SOLN
Freq: Once | RESPIRATORY_TRACT | Status: AC
Start: 2016-10-30 — End: 2016-10-29
  Administered 2016-10-30: 02:00:00 1 via RESPIRATORY_TRACT

## 2016-10-30 MED ORDER — PREDNISONE 10 MG PO TABS
10 MG | ORAL_TABLET | ORAL | 0 refills | Status: AC
Start: 2016-10-30 — End: 2016-11-08

## 2016-10-30 MED ORDER — PREDNISONE 20 MG PO TABS
20 MG | Freq: Once | ORAL | Status: AC
Start: 2016-10-30 — End: 2016-10-29
  Administered 2016-10-30: 03:00:00 60 mg via ORAL

## 2016-10-30 MED FILL — PREDNISONE 20 MG PO TABS: 20 MG | ORAL | Qty: 3

## 2016-10-30 MED FILL — IPRATROPIUM-ALBUTEROL 0.5-2.5 (3) MG/3ML IN SOLN: RESPIRATORY_TRACT | Qty: 3

## 2016-11-05 NOTE — Telephone Encounter (Signed)
Pt seen in ED 10/29/2016 mild asthma     ED letter sent to home

## 2017-02-16 ENCOUNTER — Inpatient Hospital Stay
Admit: 2017-02-16 | Discharge: 2017-02-16 | Disposition: A | Discharge status: Home / Self Care | Payer: PRIVATE HEALTH INSURANCE | Attending: Emergency Medicine

## 2017-02-16 DIAGNOSIS — G459 Transient cerebral ischemic attack, unspecified: Secondary | ICD-10-CM

## 2017-02-16 LAB — TROPONIN: Troponin I: <0.012 ng/mL (ref 0.000–0.034)

## 2017-02-16 LAB — CBC WITH AUTO DIFFERENTIAL
Absolute Baso #: 0.1 10*3/uL (ref 0.0–0.2)
Absolute Baso #: 0.1 10*3/uL (ref 0.0–0.2)
Absolute Eos #: 0.2 10*3/uL (ref 0.0–0.5)
Absolute Eos #: 0.3 10*3/uL (ref 0.0–0.5)
Absolute Lymph #: 3 10*3/uL (ref 1.0–4.3)
Absolute Lymph #: 2.9 10*3/uL (ref 1.0–4.3)
Absolute Mono #: 0.5 10*3/uL (ref 0.0–0.8)
Absolute Mono #: 0.7 10*3/uL (ref 0.0–0.8)
Absolute Neut #: 2.7 10*3/uL (ref 1.8–7.0)
Absolute Neut #: 3 10*3/uL (ref 1.8–7.0)
Basophils: 1.1 % (ref 0.0–2.0)
Basophils: 0.9 % (ref 0.0–2.0)
Eosinophils: 3.6 % (ref 1.0–6.0)
Eosinophils: 4.4 % (ref 1.0–6.0)
Granulocytes %: 42 % (ref 40.0–80.0)
Granulocytes %: 43.4 % (ref 40.0–80.0)
Hematocrit: 38.7 % — ABNORMAL LOW (ref 40.0–52.0)
Hematocrit: 36.3 % — ABNORMAL LOW (ref 40.0–52.0)
Hemoglobin: 12.6 g/dL — ABNORMAL LOW (ref 13.0–18.0)
Hemoglobin: 11.7 g/dL — ABNORMAL LOW (ref 13.0–18.0)
Lymphocyte %: 46 % — ABNORMAL HIGH (ref 20.0–40.0)
Lymphocyte %: 41.7 % — ABNORMAL HIGH (ref 20.0–40.0)
MCH: 23.7 pg — ABNORMAL LOW (ref 26.0–34.0)
MCH: 23.8 pg — ABNORMAL LOW (ref 26.0–34.0)
MCHC: 32.4 % (ref 32.0–36.0)
MCHC: 32.3 % (ref 32.0–36.0)
MCV: 73.2 fL — ABNORMAL LOW (ref 80.0–98.0)
MCV: 73.7 fL — ABNORMAL LOW (ref 80.0–98.0)
MPV: 7.7 fL (ref 7.4–10.4)
MPV: 7.9 fL (ref 7.4–10.4)
Monocytes: 7.3 % (ref 2.0–10.0)
Monocytes: 9.6 % (ref 2.0–10.0)
Platelets: 250 10*3/uL (ref 140–440)
Platelets: 231 10*3/uL (ref 140–440)
RBC: 5.29 10*6/uL (ref 4.40–5.90)
RBC: 4.92 10*6/uL (ref 4.40–5.90)
RDW: 16.7 % — ABNORMAL HIGH (ref 11.5–14.5)
RDW: 16.9 % — ABNORMAL HIGH (ref 11.5–14.5)
WBC: 6.5 10*3/uL (ref 3.6–10.7)
WBC: 7 10*3/uL (ref 3.6–10.7)

## 2017-02-16 LAB — PROTIME-INR
INR: 0.9 NA (ref 0.9–1.1)
Protime: 10.1 s (ref 9.0–12.0)

## 2017-02-16 LAB — BASIC METABOLIC PANEL W/ REFLEX TO MG FOR LOW K
Anion Gap: 9 NA
BUN: 19 mg/dL (ref 7–20)
CO2: 25 mmol/L (ref 22–30)
Calcium: 9.1 mg/dL (ref 8.4–10.4)
Chloride: 109 mmol/L — ABNORMAL HIGH (ref 98–107)
Creatinine: 1.06 mg/dL (ref 0.52–1.25)
EGFR IF NonAfrican American: >60 mL/min (ref >60)
Glucose: 128 mg/dL — ABNORMAL HIGH (ref 70–100)
Potassium: 3.2 mmol/L — ABNORMAL LOW (ref 3.5–5.1)
Sodium: 143 mmol/L (ref 137–145)
eGFR African American: >60 mL/min (ref >60)

## 2017-02-16 LAB — APTT: aPTT: 25.7 s (ref 20.0–30.5)

## 2017-02-16 LAB — MAGNESIUM: Magnesium: 2.1 mg/dL (ref 1.6–2.3)

## 2017-02-16 MED ORDER — ASPIRIN 81 MG PO CHEW
81 MG | Freq: Every day | ORAL | Status: DC
Start: 2017-02-16 — End: 2017-02-17
  Administered 2017-02-16 – 2017-02-17 (×2): 81 mg via ORAL

## 2017-02-16 MED ORDER — LABETALOL HCL 5 MG/ML IV SOLN
5 MG/ML | INTRAVENOUS | Status: DC | PRN
Start: 2017-02-16 — End: 2017-02-18

## 2017-02-16 MED ORDER — KETOROLAC TROMETHAMINE 15 MG/ML IJ SOLN
15 MG/ML | Freq: Once | INTRAMUSCULAR | Status: AC
Start: 2017-02-16 — End: 2017-02-16
  Administered 2017-02-16: 17:00:00 30 mg via INTRAVENOUS

## 2017-02-16 MED ORDER — ONDANSETRON HCL 4 MG/2ML IJ SOLN
4 MG/2ML | Freq: Four times a day (QID) | INTRAMUSCULAR | Status: DC | PRN
Start: 2017-02-16 — End: 2017-02-18

## 2017-02-16 MED ORDER — ENOXAPARIN SODIUM 40 MG/0.4ML SC SOLN
40 MG/0.4ML | Freq: Every day | SUBCUTANEOUS | Status: DC
Start: 2017-02-16 — End: 2017-02-18
  Administered 2017-02-16 – 2017-02-18 (×3): 40 mg via SUBCUTANEOUS

## 2017-02-16 MED ORDER — ACETAMINOPHEN 500 MG PO TABS
500 MG | Freq: Three times a day (TID) | ORAL | Status: DC | PRN
Start: 2017-02-16 — End: 2017-02-18
  Administered 2017-02-17 (×3): 1000 mg via ORAL

## 2017-02-16 MED ORDER — ALBUTEROL SULFATE HFA 108 (90 BASE) MCG/ACT IN AERS
108 (90 Base) MCG/ACT | Freq: Four times a day (QID) | RESPIRATORY_TRACT | Status: DC | PRN
Start: 2017-02-16 — End: 2017-02-18

## 2017-02-16 MED ORDER — NORMAL SALINE FLUSH 0.9 % IV SOLN
0.9 % | Freq: Two times a day (BID) | INTRAVENOUS | Status: DC
Start: 2017-02-16 — End: 2017-02-18
  Administered 2017-02-18: 02:00:00 10 mL via INTRAVENOUS

## 2017-02-16 MED ORDER — INFLUENZA VAC SPLIT QUAD 0.5 ML IM SUSY
0.5 ML | Freq: Once | INTRAMUSCULAR | Status: AC
Start: 2017-02-16 — End: 2017-02-16
  Administered 2017-02-16: 12:00:00 0.5 mL via INTRAMUSCULAR

## 2017-02-16 MED ORDER — SODIUM CHLORIDE 0.9 % IV BOLUS
0.9 % | Freq: Once | INTRAVENOUS | Status: AC
Start: 2017-02-16 — End: 2017-02-16
  Administered 2017-02-16: 06:00:00 250 mL via INTRAVENOUS

## 2017-02-16 MED ORDER — IBUPROFEN 400 MG PO TABS
400 MG | Freq: Once | ORAL | Status: AC
Start: 2017-02-16 — End: 2017-02-16
  Administered 2017-02-16: 12:00:00 400 mg via ORAL

## 2017-02-16 MED ORDER — SODIUM CHLORIDE 0.9 % IV SOLN
0.9 % | INTRAVENOUS | Status: DC
Start: 2017-02-16 — End: 2017-02-17
  Administered 2017-02-16: 10:00:00 via INTRAVENOUS

## 2017-02-16 MED ORDER — SODIUM CHLORIDE 0.9 % IV SOLN
0.9 % | INTRAVENOUS | Status: DC
Start: 2017-02-16 — End: 2017-02-16

## 2017-02-16 MED ORDER — FAMOTIDINE 20 MG/2ML IV SOLN
20 MG/2ML | Freq: Two times a day (BID) | INTRAVENOUS | Status: DC
Start: 2017-02-16 — End: 2017-02-16

## 2017-02-16 MED ORDER — NORMAL SALINE FLUSH 0.9 % IV SOLN
0.9 % | INTRAVENOUS | Status: DC | PRN
Start: 2017-02-16 — End: 2017-02-18

## 2017-02-16 MED FILL — ALBUTEROL SULFATE HFA 108 (90 BASE) MCG/ACT IN AERS: 108 (90 Base) MCG/ACT | RESPIRATORY_TRACT | Qty: 1.2

## 2017-02-16 NOTE — Progress Notes (Signed)
Physical Therapy      PT eval and treatment order received. Pt Barthel index is 100 on admission and 100 prior to admission. Discontinue PT order at this time.   Stephen Riley,  PT

## 2017-02-16 NOTE — Procedures (Signed)
Post Procedure Patient Status  Diagnostic Department  []  Cardiac Cath Lab  []  Cardiology - Diagnostic  []  CT Scan  []  Dialysis  []  Diagnostic Radiology (X-Ray)  []  Endoscopy  []  EP Lab (cardiac)  []  MRI  []  Nuclear Medicine  []  Neurology  [x]  Noninvasive Vascular  []  Pulmonary Function  []  Radiation Therapy  []  Special Procedures (Angio)  []  TEE  []  Ultrasound  []  Breast Center         Dept Phone Extension  []  Dept Phone Extension Number  [phone]       Diagnostic Post Procedure Status  [x]  Test/Procedure performed - tolerated well  []  Test/Procedure performed - see comments:  []  Test/Procedure partially performed - see comments:  []  Test/Procedure not performed - see comments:         Post Procedure Transportation  [x]  Patient transported to their inpatient room  []  Patient transported to the Emergency Dept  []  Patient transported to another diagnostic area  []  Patient transported to SDS  []  Patient transported to PACU  []  Patient transported to Cath Lab Observation         Patient medicated during procedure?  Patient Medicated before, during, or post procedure?  []  YES - see procedure note, MAR and/or obtain report         Patient sedated during procedure?  Patient received conscious sedation for procedure?  []  YES - see procedure note, MAR, and/or obtain report         Patient received contrast for procedure?  Patient received contrast for procedure?  []  YES - see procedure note, MAR and/or obtain report         Specimen(s) collected and sent to Lab per Diagnostic area?  Specimen(s) collected & sent to Lab per Diagnostic area?  []  YES  []  NO       Electronically signed by Darlyn Chamberianne Harris-Neloms on 02/16/2017 at 3:53 PM
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
## 2017-02-16 NOTE — ED Provider Notes (Signed)
Chief Complaint   Patient presents with   . Headache   . Eye Problem   . Numbness     pt has had the numbness, HA for about a day now and now his lf arm is going numb, when i touch his arms however it feels the same       Although initial hx and physical exam information was obtained by PA Stephen Riley, who also dictated a record of this visit. I independently examined and evaluated this patient and made all diagnostic, treatment and disposition decisions.     Stephen Riley is a 51 y.o. male presenting with headache and numbness.  Patient reports diffuse headache and numbness in his left arm that started yesterday morning.  States the symptoms have been coming and going.  He denies any weakness or speech difficulties.  States that this feels similar to when he had a transient ischemic attack that was a few years ago.     On exam: Vital Signs: Reviewed  Constitutional: comfortable, no acute distress  Head: normocephalic, atraumatic  Eyes: no scleral injection, no scleral icterus, no conjunctival erythema  ENT: oropharynx clear, MMM  Neck: supple, trachea midline  Cardiovascular: RRR, no m/r/g  Pulmonary: no evidence of labored breathing, clear to auscultation bilaterally  Abdominal: soft, nontender, nondistended  Extremities: warm, well perfused, no edema  Neurological: AAO x 3, CN 2-12 intact, motor 5/5 in all extremities, sensation intact, FTN intact bilaterally, NIH 0  Skin: warm, dry, no rash     EKG (02/16/17, 02:54): Rate: 64, Rhythm: sinus, Axis: normal, Intervals: PR 188, QRS 105, QTc 428, Interpretation: no acute ischemic changes, resolution of PVCs, Old: 11/27/15    Labs and imaging were unremarkable.  NIH was 0 patient is not a TPA candidate.  Symptoms potentially are consistent with a transient ischemic attack especially with the intermittent nature of them.  Case was discussed with SPI and accepted for observation.    Clinical Impression: Left arm numbness, acute, initial visit    Please note this report has  been produced using speech recognition software and may contain errors related to that system including errors in grammar, punctuation, and spelling, as well as words and phrases that may be inappropriate. If there are questions or concerns please feel free to contact the dictating provider for clarification.        Stephen MiyamotoBetsy Keyra Virella, MD  02/16/17 [phone]

## 2017-02-16 NOTE — ED Notes (Signed)
Report to Sindy Messingelijah rn     Danyla Wattley, RN  02/16/17 [phone]

## 2017-02-16 NOTE — ED Notes (Signed)
Pt resting in bed comfortably. NAD, Pt stable.      Stephen GibneyElisha Zhamir Pirro, RN  02/16/17 [phone]

## 2017-02-16 NOTE — Progress Notes (Signed)
Speech Language Pathology  Patient passed the Nursing Swallowing Screening and is on a Cardiac diet.  Completed speech orders as per stroke protocol.

## 2017-02-16 NOTE — Plan of Care (Signed)
Problem: HEMODYNAMIC STATUS  Goal: Patient has stable vital signs and fluid balance  Outcome: Ongoing      Problem: ACTIVITY INTOLERANCE/IMPAIRED MOBILITY  Goal: Mobility/activity is maintained at optimum level for patient  Outcome: Ongoing

## 2017-02-16 NOTE — ED Provider Notes (Signed)
[facility]  eMERGENCY dEPARTMENT eNCOUnter      Pt Name: Stephen NeatVictor V Riley  MRN: [id number]  Birthdate 06-10-1965  Date of evaluation: 02/16/2017  Provider: Benjaman PottAustin J Najae Rathert, PA-C     CHIEF COMPLAINT       Chief Complaint   Patient presents with   . Headache   . Eye Problem   . Numbness     pt has had the numbness, HA for about a day now and now his lf arm is going numb, when i touch his arms however it feels the same     I evaluated and treated this patient in conjunction with supervising physician Dr. Nedra HaiLee.  Diagnostics, plan, and disposition was discussed with supervising physician.    HISTORY OF PRESENT ILLNESS   (Location/Symptom, Timing/Onset,Context/Setting, Quality, Duration, Modifying Factors, Severity) Note limiting factors.   HPI    Stephen Riley is a 51 y.o. male who presents to the emergency department with complaint of Blurry vision and LEFT arm numbness. Patient states he has had these symptoms coming and going throughout the day today. States he does have history of TIA in the past about 2 years ago. States the symptoms felt similar to this. Denies any loss of consciousness, lightheadedness, dizziness. States the symptoms have mostly resolved at this point. Denies any difficulty standing or ambulate. He moves bilateral upper and lower extremities without difficulty. Denies any chest pain, shortness of breath, fever, chills, sweats, nausea, vomiting. Ports mild headache currently. Denies any history of stroke. Denies aggravating or alleviating factors. States the symptoms come and go randomly. States the arm tingling and visual blurriness are always symptomatic together.      I have reviewed the patient's personal and family past medical history as well as the nurse's notes and I agree. Personal history and family past medical history as listed in this chart.  I havereviewed the patient's vitals and agree.    REVIEW OF SYSTEMS    (2+ forlevel 4; 10+ for level 5)   Review of Systems  This patient's  personal and family past medical history as stated in HPI and otherwise unremarkable.  ROS as stated in HPI otherwise unremarkable,a total of 10 systems reviewed.    PAST MEDICAL HISTORY     Past Medical History:   Diagnosis Date   . Asthma    . Chronic back pain    . Erectile dysfunction    . GERD (gastroesophageal reflux disease)    . Headache        SURGICAL HISTORY       Past Surgical History:   Procedure Laterality Date   . CARDIAC CATHETERIZATION      [facility]   . HAND SURGERY Right    . HERNIA REPAIR     . ORBITAL FRACTURE SURGERY Left     remote   . UMBILICAL HERNIA REPAIR         CURRENT MEDICATIONS       Previous Medications    ALBUTEROL SULFATE HFA 108 (90 BASE) MCG/ACT INHALER    Inhale 2 puffs into the lungs every 6 hours as needed for Wheezing    IBUPROFEN (ADVIL;MOTRIN) 600 MG TABLET    Take 1 tablet by mouth every 6 hours as needed for Pain    VARENICLINE (CHANTIX CONTINUING MONTH PAK) 1 MG TABLET    Take 1 tablet by mouth 2 times daily    VARENICLINE (CHANTIX STARTING MONTH PAK) 0.5 MG X 11 &  1 MG X 42 TABLET    Take by mouth.       ALLERGIES     Butalbital-acetaminophen    FAMILY HISTORY       Family History   Problem Relation Age of Onset   . Asthma Mother    . No Known Problems Son    . No Known Problems Daughter    . Breast Cancer Maternal Aunt    . Breast Cancer Maternal Uncle         throat   . Breast Cancer Maternal Grandfather         pancreatic   . Breast Cancer Maternal Uncle         SOCIAL HISTORY       Social History     Social History   . Marital status: Legally Separated     Spouse name: N/A   . Number of children: N/A   . Years of education: N/A     Social History Main Topics   . Smoking status: Current Every Day Smoker     Packs/day: 0.50     Types: Cigarettes     Start date: 11/02/1985   . Smokeless tobacco: Never Used   . Alcohol use No   . Drug use: No   . Sexual activity: Not on file     Other Topics Concern   . Not on file     Social History Narrative   .  No narrative on file       SCREENINGS   NIH Stroke Scale  NIH Stroke Scale Assessed: Yes  Interval: Baseline  Level of Consciousness (1a. ): Alert  LOC Questions (1b. ): Answers both correctly  LOC Commands (1c. ): Obeys both correctly  Best Gaze (2. ): Normal  Visual (3. ): No visual loss  Facial Palsy (4. ): Normal  Motor Arm, Left (5a. ): No drift  Motor Arm, Right (5b. ): No drift  Motor Leg, Left (6a. ): No drift  Motor Leg, Right (6b. ): No drift  Limb Ataxia (7. ): Absent  Sensory (8. ): Normal  Best Language (9. ): No aphasia  Dysarthria (10. ): Normal  Extinction and Inattention (11): No neglect  Total: 0Glasgow Coma Scale  Eye Opening: Spontaneous  Best Verbal Response: Oriented  Best Motor Response: Obeys commands  Glasgow Coma Scale Score: 15      PHYSICAL EXAM    (up to 7 for level 4, 8 or more for level 5)     ED Triage Vitals   BP Temp Temp src Pulse Resp SpO2 Height Weight   -- -- -- -- -- -- -- --       Physical Exam  Constitutional: Patient is AAO x3, appears to be well-nourished and hydrated.  Vitalsas stated above.   Psych: Appropriate mood and affect for chief complaint.  Patient is calm and pleasant.   Integumentary: Skin intact, no erythema, no ecchymosis, no soft tissue swelling, skin is warm and dry.  Neuro: Patient has sensation intact, no gross sensory or motor deficit.  Cranial nerves II through XII are grossly intact, cerebella function is normal. Facial muscle movements are equal bilaterally, negative pronator drift, grip strength equal bilaterally, tongue protrusion midline    Vascular: Good radial pulses bilaterally.  Capillary refill of all fingers less than 2 seconds.  Good dorsal pedis pulses bilaterally.   Cardiac: Regular rhythm and rate, S1-S2 are bothaudible.  No murmurs rubs or gallops.   Respiratory: Lungs clear  to auscultation in all fields.  No tachypnea.  Patient speaks in full sentences.  Extremities: Skin is warm and dry. No soft tissueswelling or edema.    HENT: Head  appears atraumatic and normocephalic.  Trachea midline.  Neck is supple.  Throat, oral and nasal mucosa is moistand pink.     Eyes: Conjunctivae are clear. Full extraocular eye movements intact.  PERRL.      LABS:  Labs Reviewed   CBC WITH AUTO DIFFERENTIAL - Abnormal; Notable for the following:        Result Value    Hemoglobin 12.6 (*)     Hematocrit 38.7 (*)     MCV 73.2 (*)     MCH 23.7 (*)     RDW 16.7 (*)     Lymphocyte % 46.0 (*)     All other components within normal limits    Narrative:     Test Performed by [facility], [address], [place], [place]  [address]   BASIC METABOLIC PANEL W/ REFLEX TO MG FOR LOW K - Abnormal; Notable for the following:     Potassium 3.2 (*)     Chloride 109 (*)     Glucose 128 (*)     All other components within normal limits    Narrative:     Test Performed by [facility], [address] [place], [place]  [address]   TROPONIN    Narrative:     Test Performed by [facility], [address], [place], [place]  [address]   PROTIME-INR    Narrative:     Test Performed by [facility], [address], [place], [place]  [address]   APTT    Narrative:     Test Performed by [facility], [address], [place], [place]  [address]   MAGNESIUM    Narrative:     Test Performed by [facility], [address] [place], [place]  [address]        All other labs were within normal range or not returned as of this dictation.    EMERGENCY DEPARTMENT COURSE and DIFFERENTIAL DIAGNOSIS/MDM:   Vitals:    Vitals:    02/16/17 0214   BP: 116/78   Pulse: 74   Resp: 17   Temp: 97.9 F (36.6 C)   TempSrc: Oral   SpO2: 100%   Weight: 99.8 kg (220 lb)   Height: 6\' 3"  (1.905 m)       Medications   0.9 % sodium chloride infusion (not administered)   0.9 % sodium chloride bolus (250 mLs Intravenous New Bag 02/16/17 0210)       MDM  The patient came here with complaint of Blurred vision, arm tingling.  By my findings the patient seems to be suffering from TIA.    Diagnostic studies show:  Laboratory and  imaging evaluation was unremarkable.      Risk factors include history of TIA.    Weconsidered TIA, CVA, acute cardiac syndrome, lightheadedness, electrolyte abnormality, anemia, intracranial bleed, intracranial mass in our differential diagnosis.    This patient does not appear to be septic or toxic. No evidence of focal deficits or weakness.    Due to this patient's clinical presentation, physical examination, laboratory and imaging results the decision was made to admit this patient for further workup and treatment. They will be admitted to Dr. Johnney Ou service. Please see their notes for further information and details regarding this patient's  care and treatment.    REVAL:   On reevaluation the patient has no change from baseline.  We discussed the findings/plan as appropriate and the patient agrees.        Comment: Please note this report has been produced using speech recognition software and may contain errors related to that system including errors in grammar, punctuation, and spelling, as wellas words and phrases that may be inappropriate.  If there are any questions or concerns please feel free to contact the dictating provider for clarification.      PROCEDURES:  Unless otherwise noted below, none     Procedures    FINAL IMPRESSION      1. TIA (transient ischemic attack)          DISPOSITION/PLAN   DISPOSITION        PATIENT REFERRED TO:  No follow-up provider specified.    DISCHARGE MEDICATIONS:  New Prescriptions    No medications on file          (Please note:  Portions of this note werecompleted with a voice recognition program.  Efforts were made to edit the dictations but occasionally words and phrases are mis-transcribed.)  Form v2016.J.5-cn    Sarina Ill (electronically signed)  Emergency Medicine Provider        Benjaman Pott, PA-C  02/16/17 [phone]

## 2017-02-16 NOTE — Other (Signed)
NURSING BEDSIDE SWALLOW SCREENING    NIH Inclusion Criteria  Yes No        [x]       []   NIH score for 1a (LOC) is 0       [x]       []   NIH score for 1c (LOC commands) is 0       [x]       []   NIH score for 4 (facial palsy) is 0 or 1       [x]       []   NIH score for 9 (best language) is 0, 1 or 2       [x]       []   NIH score for 10 (dysarthria) is 0 or 1       [x]       []   Patient is able to sit up in bed alone       [x]    YES TO ALL OF THE ABOVE, proceed to protocol        []   NO, patient does not meet all of the above criteria, KEEP STRICT NPO UNTIL SEEN BY SPEECH THERAPY      Protocol: If patient fails ANY part of ANY step, STOP FURTHER TESTING  Give 1 tsp Lemon Ice  Yes/Pass No/Fail        [x]       []   Prompt swallow present       [x]       []   NO cough       [x]       []   NO change in vocal quality     Give 1 tsp Applesauce  Yes/Pass No/Fail        [x]       []   Prompt swallow present       [x]       []   NO cough       [x]       []   NO change in vocal quality     Give 1 tsp H20 via spoon  Yes/Pass No/Fail        [x]       []   Prompt swallow present       [x]       []   NO cough       [x]       []   NO change in vocal quality     Bedside Swallow Result      [x]   Passed the swallow screening AND has an NIH of "0" for dysarthria and "0" for best language. OK to begin Cardiac Diet with thin liquids or other prescribed diet.       []   Passed the swallow screening AND has points on NIH for dysarthria and/or best language. OK to begin Dysphagia I pureed diet with nectar thickened liquids. Proceed to formal Speech Pathologist Swallow Evaluation.       []   FAILED the swallow screening. Remain STRICT NPO (do not give p.o. meds), proceed to formal Speech Pathologist Swallow Evaluation

## 2017-02-16 NOTE — Care Coordination-Inpatient (Signed)
51 y/o male admitted under observation for TIA after presenting with blurry vision and left arm numbness. CT negative.  Currently on telemetry and IV fluids. MRI/MRA pending. Stroke Neuro consulted. PTA lived home with a friend and independent. Per pt thought had [employer] insurance but was told by [employer] counselor that it was inactive. May need med assist, will make referral to SW. No current discharge needs expressed at this time. Anticipate likely home at discharge. Reviewed observation status, no questions or concerns at this time. Electronically signed by Rod Canobin Percival Glasheen, RN on 02/16/17 at 12:13 PM

## 2017-02-16 NOTE — Discharge Instructions (Signed)
Refer to the Understanding Stroke Booklet given to you, written material provided to patient/family, addressing all signs & symptoms of a stroke, which are:    sudden numbness or weakness, especially on one side of the body   sudden confusion    sudden difficulty speaking or understanding     sudden loss of vision    sudden dizziness or loss of balance or coordination   sudden severe headache  Explained the need to call EMS ([phone]) immediately if signs & symptoms occur. Discussed medications that the patient is taking, will review medications again prior to discharge, risk factors, and the need for follow-up with a physician/APN/PA after discharge.  Electronically signed by Kara Diesanielle Kimi Bordeau, RN on 02/16/2017 at 6:21 AM    Discussed the patient's personal risk factors for Stroke /TIA with patient/family, and ways to reduce the risk for a recurrent stroke. Patient's personal risk factors which were identified are:        []  High blood pressure       []  High cholesterol       []  Atrial fibrillation       []  Diabetes       []  Smoking       []  Overweight       []  Lack of Exercise       []  Sleep apnea       []  Prior heart disease or heart attack       []  Excessive alcohol use       []  Use of illicit drugs       [x]  Personal history of previous TIA or stroke       []  Family history of stroke or heart disease       []  Carotid stenosis       []  Heart failure       []  Migraine       []  Hormone replacement therapy       []  Current pregnancy (up to six weeks post partum)       []  Depression       []  Sickle Cell       []  Renal insufficiency - chronic       []  None    Refer to Understanding Stroke Booklet. Advised patient that risk for stroke/TIA can be reduced by modifying/controlling risk factors. Patient advised to take medications as prescribed, which will be detailed in the discharge instructions, and to not stop taking them without consulting a physician. In addition, pt. advised to maintain a healthy diet, exercise regularly  and to not smoke.    Electronically signed by Kara Diesanielle Marialena Wollen, RN on 02/16/2017 at 6:21 AM
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
## 2017-02-17 LAB — LIPID PANEL
Chol/HDL Ratio: 3 NA
Cholesterol: 130 mg/dL (ref <200)
HDL: 41 mg/dL (ref 40–60)
LDL Cholesterol: 59 mg/dL (ref <100)
Triglycerides: 150 mg/dL — AB (ref <150)

## 2017-02-17 LAB — HEMOGLOBIN A1C
Hemoglobin A1C: 5.5 % (ref 4.0–5.7)
eAG: 111 mg/dL

## 2017-02-17 MED ORDER — METOCLOPRAMIDE HCL 5 MG/ML IJ SOLN
5 MG/ML | Freq: Once | INTRAMUSCULAR | Status: AC
Start: 2017-02-17 — End: 2017-02-17
  Administered 2017-02-17: 22:00:00 10 mg via INTRAVENOUS

## 2017-02-17 MED ORDER — DIPHENHYDRAMINE HCL 50 MG/ML IJ SOLN
50 MG/ML | Freq: Once | INTRAMUSCULAR | Status: AC
Start: 2017-02-17 — End: 2017-02-17
  Administered 2017-02-17: 22:00:00 25 mg via INTRAVENOUS

## 2017-02-17 MED ORDER — KETOROLAC TROMETHAMINE 15 MG/ML IJ SOLN
15 MG/ML | Freq: Once | INTRAMUSCULAR | Status: AC
Start: 2017-02-17 — End: 2017-02-17
  Administered 2017-02-17: 22:00:00 30 mg via INTRAVENOUS

## 2017-02-17 MED ORDER — SODIUM CHLORIDE 0.9 % IV BOLUS
0.9 % | Freq: Once | INTRAVENOUS | Status: AC
Start: 2017-02-17 — End: 2017-02-17
  Administered 2017-02-17: 23:00:00 500 mL via INTRAVENOUS

## 2017-02-17 MED FILL — MAPAP 500 MG PO TABS: 500 MG | ORAL | Qty: 2

## 2017-02-17 NOTE — Progress Notes (Signed)
[facility] Progress Note        Stephen Riley  DOB:  1965/07/21(51 y.o.)  MRN:  [id number]    Date: 02/17/17   Subjective:    HPI  The patient complains of HA and LUE paresthesias  Numbness/tingling improved --> not completely resolved  Continues to complain of severe, global throbbing HA with photophobia  Neurology note reviewed    Scheduled Meds:  . ketorolac  30 mg Intravenous Once   . metoclopramide  10 mg Intravenous Once   . diphenhydrAMINE  25 mg Intravenous Once   . sodium chloride  500 mL Intravenous Once   . sodium chloride flush  10 mL Intravenous 2 times per day   . enoxaparin  40 mg Subcutaneous Daily     Continuous Infusions:  PRN Meds:albuterol sulfate HFA, sodium chloride flush, ondansetron, labetalol, acetaminophen    Review of Systems   Respiratory: Negative for cough and shortness of breath.    Cardiovascular: Negative for chest pain and palpitations.       Interval Pertinent History:  Social History   Substance Use Topics   . Smoking status: Current Every Day Smoker     Packs/day: 0.50     Types: Cigarettes     Start date: 11/02/1985   . Smokeless tobacco: Never Used   . Alcohol use No       Objective:     Patient Vitals for the past 24 hrs:   BP Temp Temp src Pulse Resp SpO2   02/17/17 1449 135/89 96.7 F (35.9 C) Temporal 65 18 96 %   02/17/17 1100 - - - 62 - -   02/17/17 1036 137/86 - Temporal 62 16 -   02/17/17 1005 (!) 144/85 98.7 F (37.1 C) Temporal 62 16 98 %   02/17/17 0802 - - - 63 - -   02/17/17 0310 139/79 97.2 F (36.2 C) Temporal 61 18 95 %   02/16/17 2329 (!) 144/84 96.4 F (35.8 C) Temporal 68 18 96 %   02/16/17 2045 129/88 98.5 F (36.9 C) Temporal 60 18 97 %   02/16/17 1625 (!) 143/96 97.6 F (36.4 C) Temporal 69 18 98 %       Average, Min, and Max for last 24 hours Vitals:  TEMPERATURE:  Temp  Avg: 97.5 F (36.4 C)  Min: 96.4 F (35.8 C)  Max: 98.7 F (37.1 C)    RESPIRATIONS RANGE: Resp  Avg: 17.4  Min: 16  Max: 18    PULSE RANGE: Pulse  Avg: 63.6  Min: 60   Max: 69    BLOOD PRESSURE RANGE:  Systolic (24hrs), Avg:139 , Min:129 , Max:144   ; Diastolic (24hrs), Avg:87, Min:79, Max:96      PULSE OXIMETRY RANGE: SpO2  Avg: 96.7 %  Min: 95 %  Max: 98 %    I/O last 3 completed shifts:  In: 1600 [P.O.:900; I.V.:700]  Out: -     Physical Exam   Constitutional: He is oriented to person, place, and time. He appears well-developed and well-nourished.   HENT:   Head: Normocephalic.   Mouth/Throat: Oropharynx is clear and moist.   Eyes: Conjunctivae are normal. No scleral icterus.   Neck: Neck supple. No JVD present.   Cardiovascular: Normal rate and regular rhythm.  Exam reveals no gallop.    No murmur heard.  Pulmonary/Chest: Effort normal. He has no wheezes. He has no rales. He exhibits no tenderness.   Abdominal: Soft. Bowel sounds are normal.  He exhibits no distension. There is no tenderness.   Musculoskeletal: He exhibits no edema or tenderness.   Neurological: He is alert and oriented to person, place, and time. No cranial nerve deficit. Coordination normal.   Skin: Skin is warm and dry.   Psychiatric: He has a normal mood and affect. Judgment normal.       Lab Results   Component Value Date    WBC 7.0 02/16/2017    HGB 11.7 (L) 02/16/2017    HCT 36.3 (L) 02/16/2017    MCV 73.7 (L) 02/16/2017    PLT 231 02/16/2017     Lab Results   Component Value Date    NA 143 02/16/2017    K 3.2 02/16/2017    CL 109 02/16/2017    CO2 25 02/16/2017    BUN 19 02/16/2017    CREATININE 1.06 02/16/2017    GLUCOSE 128 02/16/2017    CALCIUM 9.1 02/16/2017        Lab Results   Component Value Date    LABA1C 5.5 02/17/2017      Additional results of the last 24 hours have been reviewed.    Assessment and Plan:         Principal Problem:    Paresthesia of left arm  Active Problems:    Headache  Resolved Problems:    * No resolved hospital problems. *      1. HA --> neurology feels this is migraine with aura  2. LUE paresthesias --> as above    He still doesn't feel better  Will try abortive Rx for  HA --> toradol, benadryl, reglan, IVF bolus  D/C ASA  Plan discharge home 11/6    DVTProphylaxis: lovenox 40 q 24hr - creatinine clearance >30    Disposition:await clinical improvement and anticipate discharge tomorrow    I spent over 51% of total time providing counseling or incoordination of care: > 35 minutes   discussed with Dr. Delbert Phenix, I personally examined the patient and I personally reviewed chart, data, labs radiology reports    7AM-5PM please page:   Electronically signed by Camillo Flaming, MD on 02/17/17 at 3:55 PM    5PM-7AM please page:  [facility]

## 2017-02-17 NOTE — Care Coordination-Inpatient (Signed)
Pt is listed as self-pay. SW called HRS liaison, Tatem, who confirmed that she would attempt to meet with pt this afternoon to see if he will qualify for Medicaid and complete an application with him if so. SW will follow and remain involved as needed. Electronically signed by Tora PerchesLaura Remas Sobel, LSW on 02/17/2017 at 12:31 PM
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
## 2017-02-17 NOTE — Care Coordination-Inpatient (Signed)
CTA CT head pending. Barthel 100/100. Plan is home at discharge. MD aware of need for 4$ medications at discharge. Electronically signed by Creed CopperAndra Hiram Mciver, RN on 02/17/2017 at 12:41 PM

## 2017-02-17 NOTE — Plan of Care (Signed)
Problem: HEMODYNAMIC STATUS  Goal: Patient has stable vital signs and fluid balance  Outcome: Met This Shift      Problem: ACTIVITY INTOLERANCE/IMPAIRED MOBILITY  Goal: Mobility/activity is maintained at optimum level for patient  Outcome: Met This Shift      Problem: Pain:  Goal: Pain level will decrease  Pain level will decrease   Outcome: Ongoing    Goal: Control of acute pain  Control of acute pain   Outcome: Ongoing

## 2017-02-18 MED ORDER — ASPIRIN-ACETAMINOPHEN-CAFFEINE 250-250-65 MG PO TABS
250-250-65 | ORAL_TABLET | Freq: Two times a day (BID) | ORAL | 3 refills | Status: DC | PRN
Start: 2017-02-18 — End: 2019-07-07

## 2017-02-18 NOTE — Discharge Instructions (Signed)
.   Good nutrition is important when healing from an illness, injury, or surgery.  Follow any nutrition recommendations given to you during your hospital stay.   . If you were given an oral nutrition supplement while in the hospital, continue to take this supplement at home.  You can take it with meals, in-between meals, and/or before bedtime. These supplements can be purchased at most local grocery stores, pharmacies, and chain super-stores.   . If you have any questions about your diet or nutrition, call the hospital and ask for the dietitian.

## 2017-02-18 NOTE — Other (Unsigned)
Patient Acct Nbr: [id number]   Primary AUTH/CERT:   Primary Insurance Company Name: Psychologist, educationalnstitutional Accts  Primary Insurance Plan name: [place] Homeless Pts  Primary Insurance Group Number:   Primary Insurance Plan Type: Doctor, hospitalLiability  Primary Insurance Policy Number: [id number]

## 2017-02-18 NOTE — Plan of Care (Signed)
Problem: HEMODYNAMIC STATUS  Goal: Patient has stable vital signs and fluid balance  Outcome: Ongoing      Problem: ACTIVITY INTOLERANCE/IMPAIRED MOBILITY  Goal: Mobility/activity is maintained at optimum level for patient  Outcome: Ongoing      Problem: Pain:  Goal: Pain level will decrease  Pain level will decrease   Outcome: Ongoing

## 2017-02-18 NOTE — Plan of Care (Signed)
Problem: HEMODYNAMIC STATUS  Goal: Patient has stable vital signs and fluid balance  Outcome: Met This Shift      Problem: ACTIVITY INTOLERANCE/IMPAIRED MOBILITY  Goal: Mobility/activity is maintained at optimum level for patient  Outcome: Met This Shift      Problem: Pain:  Goal: Pain level will decrease  Pain level will decrease   Outcome: Ongoing    Goal: Control of acute pain  Control of acute pain   Outcome: Ongoing    Goal: Control of chronic pain  Control of chronic pain   Outcome: Ongoing
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
## 2017-02-18 NOTE — Progress Notes (Signed)
Nutrition rescreen completed. Chart reviewed. Patient to be monitored and followed by the diet technician.  Electronically signed by Carolann LittlerMaureen Smith-Roberts, DTR on 02/18/2017 at 7:48 AM

## 2017-02-18 NOTE — Discharge Instructions (Signed)
As tolerated

## 2017-02-18 NOTE — Progress Notes (Signed)
Discharge instructions reviewed with pt.  Pt ride will not be here until about 1600.

## 2017-02-18 NOTE — Progress Notes (Signed)
Occupational Therapy  Note    OT orders per barthel. Pt's barthel score = 100 both prior to admit as well as on admission indicating no OT needs. Will discontinue orders.    Joaquim Laierra Aubre Quincy, OTR/L
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
## 2017-02-19 NOTE — Telephone Encounter (Signed)
Reviewed.

## 2017-02-20 NOTE — Telephone Encounter (Signed)
1st attempt to complete TOC: preferred number is not working. Called home # and spoke with pt.     FOLLOW UP QUESTIONS FOR MA/SW:  1. Did you get medications filled and taking them as instructed from discharge?yes  2. Are you following your discharge instructions from your hospital stay?yes   3. Please confirm patient is scheduled for a follow up appointment within the above time frame. Pt confirmed, time date of TOC apt 02/25/17 @ 11:30am

## 2017-02-25 ENCOUNTER — Ambulatory Visit
Admit: 2017-02-25 | Discharge: 2017-02-25 | Payer: PRIVATE HEALTH INSURANCE | Attending: Internal Medicine | Primary: Internal Medicine

## 2017-02-25 DIAGNOSIS — G43109 Migraine with aura, not intractable, without status migrainosus: Secondary | ICD-10-CM

## 2017-02-25 MED ORDER — ALBUTEROL SULFATE HFA 108 (90 BASE) MCG/ACT IN AERS
108 | Freq: Four times a day (QID) | RESPIRATORY_TRACT | 3 refills | Status: DC | PRN
Start: 2017-02-25 — End: 2017-03-20

## 2017-02-25 MED ORDER — TOPIRAMATE 25 MG PO TABS
25 MG | ORAL_TABLET | Freq: Two times a day (BID) | ORAL | 3 refills | Status: DC
Start: 2017-02-25 — End: 2018-06-08
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
## 2017-03-19 DIAGNOSIS — R519 Headache, unspecified: Secondary | ICD-10-CM

## 2017-03-19 NOTE — ED Provider Notes (Signed)
Emergency Department Encounter  [facility] EMERGENCY DEPT    Patient: Stephen Riley  MRN: [id number]  DOB: 12-03-1965  Date of Evaluation: 03/19/2017  ED Provider: Durwin Nora, APRN - CNP    Chief Complaint       Chief Complaint   Patient presents with   . Asthma     "flare up"    . Shortness of Breath     95 % on RA, able to speak in full sentences.      HOPI     Stephen Riley is a 51 y.o. male who presents to the emergency department With complaints of a headache and shortness of breath. Patient states he has a history of headaches and this feels like an early migraine. He describes the pain as throbbing, all over, no changes in vision, no photo/phonophobia. He has not attempted any medications at home. Patient also endorses some slight shortness of breath/wheezing, states he has a history of asthma. Patient states that he no longer has his inhalers. He denies any fever, chills, cough, chest pain. Is any swelling in the lower extremities.    is not worst headache of life. is not acute onset.  Fevers Absent .  Neck stiffness Absent .  Numbness, weakness and tingling Absent .  Syncope Absent .    ROS:     At least 10 systems reviewed and otherwise acutely negative except as in the HOPI.    Past History     Past Medical History:   Diagnosis Date   . Asthma    . Chronic back pain    . Erectile dysfunction    . GERD (gastroesophageal reflux disease)    . Migraine      Past Surgical History:   Procedure Laterality Date   . CARDIAC CATHETERIZATION      Unremarkable [facility] at [facility]   . HAND SURGERY Right    . HERNIA REPAIR     . ORBITAL FRACTURE SURGERY Left     remote   . UMBILICAL HERNIA REPAIR       Social History     Social History   . Marital status: Legally Separated     Spouse name: N/A   . Number of children: N/A   . Years of education: N/A     Social History Main Topics   . Smoking status: Current Every Day Smoker     Packs/day: 0.50     Types: Cigarettes     Start date: 11/02/1985   . Smokeless tobacco: Never  Used   . Alcohol use No   . Drug use: No   . Sexual activity: Not Asked     Other Topics Concern   . None     Social History Narrative   . None       Medications/Allergies     Discharge Medication List as of 03/20/2017  1:49 AM      CONTINUE these medications which have NOT CHANGED    Details   ibuprofen (ADVIL;MOTRIN) 800 MG tablet Take 800 mg by mouthHistorical Med      topiramate (TOPAMAX) 25 MG tablet Take 1 tablet by mouth 2 times daily, Disp-60 tablet, R-3Normal      aspirin-acetaminophen-caffeine (EXCEDRIN MIGRAINE) 250-250-65 MG per tablet Take 1 tablet by mouth 2 times daily as needed for Headaches, Disp-90 tablet, R-3NO PRINT           No Known Allergies     Physical Exam  ED Triage Vitals [03/20/17 0001]   Enc Vitals Group      BP 135/74      Pulse 93      Resp 18      Temp 97.6 F (36.4 C)      Temp Source Oral      SpO2 95 %      Weight 215 lb (97.5 kg)      Height 6\' 3"  (1.905 m)      Head Circumference       Peak Flow       Pain Score       Pain Loc       Pain Edu?       Excl. in GC?      GENERAL APPEARANCE: Awake and alert.   HEAD: Normocephalic. Atraumatic.  EYES: EOM's grossly intact. Sclera anicteric.   ENT: Mucous membranes are moist. Tolerates saliva. No trismus.  NECK: Supple. No meningismus. Trachea midline.  HEART: RRR. Radial pulses 2+.  LUNGS: Respirations unlabored. CTAB, no wheezing.  ABDOMEN: Soft. Non-tender. No guarding or rebound.  EXTREMITIES: No acute deformities.  SKIN: Warm and dry.  NEUROLOGICAL: Awake and alert. GCS 15. Cranial nerves 2-12 grossly intact. Strength 5/5 throughout. Light touch sensation intact throughout. Finger to nose WNL. DTR's 2+ in all 4 extremities.  PSYCHIATRIC: Normal mood.    Diagnostics   Labs:  No results found for this visit on 03/19/17.  Radiographs:  No results found.    ED Course and MDM   In brief, Stephen Riley is a 51 y.o. male who presented to the emergency department With complaints of a headache and shortness of breath/wheezing, states  feels like his asthma. Patient is alert and oriented 3, no obvious distress, vital signs are stable, heart rate is reviewed, lung sounds are clear without wheezing or focal findings, abdomen is soft and nontender, grossly neurologically intact. Patient is in no respiratory distress. There is no calf swelling or asymmetry. Patient was treated with a migraine cocktail to further evaluate. The patient's chart was reviewed and he has been seen on several occasions for asthma but has never had any wheezing. Patient is homeless and feels that this may be a social issue for returning to the emergency department.    Reassessment-patient resting complaint bed, no obvious distress, vital signs are stable, headache has resolved after medications. Lungs sounds remain clear, neurologically intact.    At this time I feel the patient is stable for discharge, he will be given a prescription for an inhaler, follow-up as needed.    I estimate there is LOW risk for (including but not limited to) BACTERIAL MENINGITIS, ISCHEMIC OR HEMORRHAGE CVA (Ex. SAH), VASCULAR DISSECTION, TEMPORAL ARTERITIS, or ACUTE CORONARY SYNDROME thus I consider the discharge disposition reasonable. Stephen Riley (or their surrogate) and I have discussed the diagnosis and risks, and we agree with discharging home with close follow-up. We also discussed returning to the Emergency Department immediately if new or worsening symptoms occur. We have discussed the symptoms which are most concerning that necessitate immediate return.    ED Medication Orders     Start Ordered     Status Ordering Provider    03/20/17 0158 03/20/17 0158  albuterol sulfate HFA 108 (90 Base) MCG/ACT inhaler     Comments:  Gaynelle ArabianMohney, Courtney: cabinet override    Last MAR action:  Given - by Winfield CunasMOHNEY, COURTNEY M. on 03/20/17 at 0157     03/20/17 0015 03/20/17 0006  ipratropium-albuterol (  DUONEB) nebulizer solution 1 ampule  ONCE      Last MAR action:  Given - by Marcille BuffyBURKHEAD, TIAH M. on  03/20/17 at 0020 Greg Eckrich A    03/20/17 0015 03/20/17 0006  ibuprofen (ADVIL;MOTRIN) tablet 600 mg  ONCE      Last MAR action:  Given - by Roger KillKINNEY, PATRICIA L on 03/20/17 at 0031 Kip Cropp A    03/20/17 0015 03/20/17 0006  metoclopramide (REGLAN) tablet 10 mg  ONCE      Last MAR action:  Given - by Allie BossierKINNEY, PATRICIA L on 03/20/17 at 0031 Caidence Kaseman A    03/20/17 0015 03/20/17 0006  diphenhydrAMINE (BENADRYL) capsule 50 mg  ONCE      Last MAR action:  Given - by Roger KillKINNEY, PATRICIA L on 03/20/17 at 0031 Adrian Specht A        Final Impression      1. Nonintractable headache, unspecified chronicity pattern, unspecified headache type    2. Mild intermittent asthma, unspecified whether complicated    3. Mild intermittent asthma without complication        DISPOSITION Decision To Discharge 03/20/2017 01:48:25 AM       Follow-up   Argie RammingChristopher L Carmichael, DO  [address]  [address]  [place] [id number]  [phone]    Schedule an appointment as soon as possible for a visit       [facility]  [address]  [place] [id number]  [phone]    If symptoms worsen    Discharge Medication List as of 03/20/2017  1:49 AM          (Please note that portions of this note may have been completed with a voice recognition program. Efforts were made to edit the dictations but occasionally words are mis-transcribed.)    Durwin NoraAmanda A Vilda Zollner, APRN - CNP  [employer]     Durwin Noramanda A Sabah Zucco, APRN - CNP  03/20/17 [phone]

## 2017-03-20 ENCOUNTER — Inpatient Hospital Stay: Admit: 2017-03-20 | Discharge: 2017-03-20 | Disposition: A | Discharge status: Home / Self Care

## 2017-03-20 MED ORDER — METOCLOPRAMIDE HCL 10 MG PO TABS
10 MG | Freq: Once | ORAL | Status: AC
Start: 2017-03-20 — End: 2017-03-20
  Administered 2017-03-20: 06:00:00 10 mg via ORAL

## 2017-03-20 MED ORDER — IBUPROFEN 600 MG PO TABS
600 MG | Freq: Once | ORAL | Status: AC
Start: 2017-03-20 — End: 2017-03-20
  Administered 2017-03-20: 06:00:00 600 mg via ORAL

## 2017-03-20 MED ORDER — ALBUTEROL SULFATE HFA 108 (90 BASE) MCG/ACT IN AERS
108 (90 Base) MCG/ACT | RESPIRATORY_TRACT | Status: AC
Start: 2017-03-20 — End: 2017-03-20
  Administered 2017-03-20: 07:00:00

## 2017-03-20 MED ORDER — IPRATROPIUM-ALBUTEROL 0.5-2.5 (3) MG/3ML IN SOLN
Freq: Once | RESPIRATORY_TRACT | Status: AC
Start: 2017-03-20 — End: 2017-03-20
  Administered 2017-03-20: 05:00:00 1 via RESPIRATORY_TRACT

## 2017-03-20 MED ORDER — ALBUTEROL SULFATE HFA 108 (90 BASE) MCG/ACT IN AERS
108 (90 Base) MCG/ACT | Freq: Four times a day (QID) | RESPIRATORY_TRACT | 3 refills | Status: DC | PRN
Start: 2017-03-20 — End: 2017-04-10

## 2017-03-20 MED ORDER — DIPHENHYDRAMINE HCL 50 MG PO CAPS
50 MG | Freq: Once | ORAL | Status: AC
Start: 2017-03-20 — End: 2017-03-20
  Administered 2017-03-20: 06:00:00 50 mg via ORAL

## 2017-03-20 MED FILL — DIPHENHYDRAMINE HCL 50 MG PO CAPS: 50 mg | ORAL | Qty: 1

## 2017-03-20 MED FILL — METOCLOPRAMIDE HCL 10 MG PO TABS: 10 mg | ORAL | Qty: 1

## 2017-03-20 MED FILL — PROAIR HFA 108 (90 BASE) MCG/ACT IN AERS: 108 (90 Base) MCG/ACT | RESPIRATORY_TRACT | Qty: 8.5

## 2017-03-20 MED FILL — IBUPROFEN 600 MG PO TABS: 600 mg | ORAL | Qty: 1

## 2017-03-20 NOTE — ED Notes (Signed)
The proper method of use, as well as anticipated side effects, of this metered-dose inhaler are discussed and demonstrated to the patient.       Clemon Chambersourtney M. Mare LoanMohney, RN  03/20/17 [phone]

## 2017-03-20 NOTE — Other (Unsigned)
Patient Acct Nbr: [id number]   Primary AUTH/CERT:   Primary Insurance Company Name: Psychologist, educationalnstitutional Accts  Primary Insurance Plan name: [place] Homeless Pts  Primary Insurance Group Number:   Primary Insurance Plan Type: Doctor, hospitalLiability  Primary Insurance Policy Number: [id number]

## 2017-03-20 NOTE — Discharge Instructions (Signed)
In the medical field, there is always a level of diagnostic uncertainty, even if this uncertainty is low. For this reason, it is important to immediately return to the emergency department if you have any new symptoms, worsening symptoms, change of symptoms, or if you have any other concerns. We would be happy to re-evaluate you. Otherwise, please take your medications as prescribed and follow-up as recommended.

## 2017-03-20 NOTE — ED Triage Notes (Signed)
Patient arrived from triage for shortness of breath secondary to asthma. Patient is able to speak in full sentences, no distress noted. Patient endorses increased work of breathing unrelieved by inhaler. EKG to room.

## 2017-03-20 NOTE — ED Notes (Signed)
Patient, awake alert and oriented. Denies chest pain at this time. No acute respiratory distress noted. Skin pink warm and dry. Discharge instructions and scripts discussed with patient. Patient ambulated out of E.D. With RN.      Clemon Chambersourtney M. Mare LoanMohney, RN  03/20/17 [phone]

## 2017-04-03 ENCOUNTER — Encounter: Attending: Internal Medicine | Primary: Internal Medicine

## 2017-04-10 ENCOUNTER — Inpatient Hospital Stay
Admit: 2017-04-10 | Discharge: 2017-04-10 | Disposition: A | Discharge status: Home / Self Care | Attending: Emergency Medicine

## 2017-04-10 DIAGNOSIS — J45909 Unspecified asthma, uncomplicated: Secondary | ICD-10-CM

## 2017-04-10 MED ORDER — PREDNISONE 20 MG PO TABS
20 MG | ORAL_TABLET | Freq: Every day | ORAL | 0 refills | Status: AC
Start: 2017-04-10 — End: 2017-04-14

## 2017-04-10 MED ORDER — IPRATROPIUM-ALBUTEROL 0.5-2.5 (3) MG/3ML IN SOLN
Freq: Once | RESPIRATORY_TRACT | Status: AC
Start: 2017-04-10 — End: 2017-04-10
  Administered 2017-04-10: 11:00:00 1 via RESPIRATORY_TRACT

## 2017-04-10 MED ORDER — PREDNISONE 20 MG PO TABS
20 MG | Freq: Once | ORAL | Status: AC
Start: 2017-04-10 — End: 2017-04-10
  Administered 2017-04-10: 10:00:00 40 mg via ORAL

## 2017-04-10 MED ORDER — ALBUTEROL SULFATE HFA 108 (90 BASE) MCG/ACT IN AERS
108 (90 Base) MCG/ACT | Freq: Four times a day (QID) | RESPIRATORY_TRACT | 3 refills | Status: DC | PRN
Start: 2017-04-10 — End: 2018-06-08

## 2017-04-10 MED FILL — PREDNISONE 20 MG PO TABS: 20 mg | ORAL | Qty: 2

## 2017-04-10 NOTE — ED Triage Notes (Signed)
Patient arrived to triage for asthma exacerabation. Patient states he is out of his albuterol inhaler. Patient is 100% on RA, no acute respiratory distress noted. Able to speak in full sentences.

## 2017-04-10 NOTE — ED Notes (Signed)
RT at bedside     Elon Spannerhyllis Carianna Lague, RN  04/10/17 [phone]

## 2017-04-10 NOTE — ED Provider Notes (Signed)
[facility] EMERGENCY DEPT  eMERGENCY dEPARTMENT eNCOUnter      Pt Name: Stephen Riley  MRN: [id number]  Birthdate February 16, 1966  Date of evaluation: 04/10/2017  Provider: Lamonte Sakai, MD     CHIEF COMPLAINT       Chief Complaint   Patient presents with   . Asthma     SOB   . Medication Refill     out of albuterol inhaler         HISTORY OF PRESENT ILLNESS   (Location/Symptom, Timing/Onset,Context/Setting, Quality, Duration, Modifying Factors, Severity) Note limiting factors.   HPI    Stephen Riley is a 51 y.o. male who presents to the emergency department for the evaluation of Wheezing and shortness of breath the past night.  States that symptoms been ongoing throughout the day.  He felt like he may have a cold coming on.  He has had occasional cough.  He states that he feels like he would have been fine but does not have any inhaler left.  No fevers, chills.  No chest pain.  Exacerbated by cold air.      Nursing Notes were reviewed.    REVIEW OF SYSTEMS    (2+ for level 4; 10+ for level 5)   Review of Systems  10 system review of systems negative except as perhistory of present illness.    PAST MEDICAL HISTORY     Past Medical History:   Diagnosis Date   . Asthma    . Chronic back pain    . Erectile dysfunction    . GERD (gastroesophageal reflux disease)    . Migraine        SURGICAL HISTORY       Past Surgical History:   Procedure Laterality Date   . CARDIAC CATHETERIZATION      Unremarkable findings at [facility] X[facility]   . HAND SURGERY Right    . HERNIA REPAIR     . ORBITAL FRACTURE SURGERY Left     remote   . UMBILICAL HERNIA REPAIR         CURRENT MEDICATIONS       Previous Medications    ALBUTEROL SULFATE HFA 108 (90 BASE) MCG/ACT INHALER    Inhale 2 puffs into the lungs every 6 hours as needed for Wheezing    ASPIRIN-ACETAMINOPHEN-CAFFEINE (EXCEDRIN MIGRAINE) 250-250-65 MG PER TABLET    Take 1 tablet by mouth 2 times daily as needed for Headaches    IBUPROFEN (ADVIL;MOTRIN) 800 MG TABLET    Take 800 mg by mouth     TOPIRAMATE (TOPAMAX) 25 MG TABLET    Take 1 tablet by mouth 2 times daily       ALLERGIES     Patient has no known allergies.    FAMILY HISTORY       Family History   Problem Relation Age of Onset   . Asthma Mother    . No Known Problems Son    . No Known Problems Daughter    . Breast Cancer Maternal Aunt    . Breast Cancer Maternal Uncle         throat   . Breast Cancer Maternal Grandfather         pancreatic   . Breast Cancer Maternal Uncle         SOCIALHISTORY       Social History     Social History   . Marital status: Legally Separated     Spouse name: N/A   .  Number of children: N/A   . Years of education: N/A     Social History Main Topics   . Smoking status: Current Every Day Smoker     Packs/day: 0.50     Types: Cigarettes     Start date: 11/02/1985   . Smokeless tobacco: Never Used   . Alcohol use No   . Drug use: No   . Sexual activity: Not Asked     Other Topics Concern   . None     Social History Narrative   . None       SCREENINGS           PHYSICAL EXAM    (up to 7 for level 4, 8 or more for level 5)     ED Triage Vitals [04/10/17 0148]   BP Temp Temp Source Pulse Resp SpO2 Height Weight   126/83 97.6 F (36.4 C) Oral 95 16 99 % 6\' 3"  (1.905 m) 220 lb (99.8 kg)       Physical Exam    General Appearance:  Alert, cooperative, no distress, appears stated age.   Head:  Normocephalic, without obvious abnormality, atraumatic.   Eyes:  conjunctiva/corneas clear, EOM's intact.  Sclera anicteric.   ENT: Mucous membranes moist.   Neck: Supple, symmetrical, trachea midline, no adenopathy.  No jugular venous distention.     Lungs:   No Respiratory Distress.Scattered respiratory wheezes, moving good air.         Heart:  RRR   Abdomen:   Soft, non-distended, non-tender   Extremities:  Full range of motion.   Pulses: Equal bilaterally in all 4 extremities    Skin:  No rashes or lesions to exposed skin.   Neurologic: Alert and oriented X 3.   Motor grossly normal.  Speech clear.          DIAGNOSTIC RESULTS     EKG  (Per Emergency Physician):       RADIOLOGY (Per EmergencyPhysician):       Interpretation per the Radiologist below, if available at the time of this note:  No results found.    ED BEDSIDE ULTRASOUND:   Performed by ED Physician - none    LABS:  Labs Reviewed - No data to display     All otherlabs were within normal range or not returned as of this dictation.    EMERGENCY DEPARTMENT COURSE and DIFFERENTIAL DIAGNOSIS/MDM:   Vitals:    Vitals:    04/10/17 0148   BP: 126/83   Pulse: 95   Resp: 16   Temp: 97.6 F (36.4 C)   TempSrc: Oral   SpO2: 99%   Weight: 99.8 kg (220 lb)   Height: 6\' 3"  (1.905 m)       Medications   ipratropium-albuterol (DUONEB) nebulizer solution 1 ampule (not administered)   predniSONE (DELTASONE) tablet 40 mg (not administered)       MDM.  Patient presents with asthma exacerbation that likely could have been avoided if he had his albuterol inhaler refilled.  He was given 1 DuoNeb here in the Emergency Department for scattered wheezing on exam.  He was 95% on room air with respirations of 16 and no increased work of breathing.  I do not believe he needs a controller medication at home as he is only normal using his albuterol inhaler 2-3 times per week.        REVAL:      patient had improvement of symptoms after DuoNeb.  Prescription for albuterol  inhaler given.  We will continue prednisone at home as well.    CRITICAL CARE TIME   Total Critical Care time was 0 minutes, excluding separately reportable procedures.  There was a high probability of clinically significant/life threatening deterioration in thepatient's condition which required my urgent intervention.     CONSULTS:  None    PROCEDURES:  Unless otherwise noted below, none    Procedures    FINAL IMPRESSION    No diagnosis found.      DISPOSITION/PLAN   DISPOSITION        PATIENT REFERRED TO:  No follow-up provider specified.    DISCHARGE MEDICATIONS:  New Prescriptions    No medications on file          (Please note:  Portions of this  note were completed with a voicerecognition program.  Efforts were made to edit the dictations but occasionally words and phrases are mis-transcribed.)  Form [id number].[id number]    Lamonte Sakaiavid Jeffren Dombek, MD (electronically signed)  Emergency Medicine Provider       Lamonte Sakaiavid Anesia Blackwell, MD  04/10/17 [phone]

## 2017-04-10 NOTE — ED Notes (Signed)
Assumed care of this pt upon arrival to room, ambulatory.  Pt appears to be in NAD at this time.     Elon SpannerPhyllis Maxxwell Edgett, RN  04/10/17 [phone]

## 2017-04-10 NOTE — Other (Unsigned)
Patient Acct Nbr: [id number]   Primary AUTH/CERT:   Primary Insurance Company Name: [employer]  Primary Insurance Plan name: [employer]  Primary Insurance Group Number:   Primary Insurance Plan Type: Health  Primary Insurance Policy Number: [id number]

## 2017-04-10 NOTE — ED Notes (Signed)
Pt states he feels much better.  Wants to leave     Elon Spannerhyllis Codylee Patil, RN  04/10/17 [phone]

## 2017-04-19 NOTE — Telephone Encounter (Signed)
Date of ER Visit: 04/10/17    [facility]    Were you discharged with a prescription for any medication for your asthma symptoms? (Yes/No) : y  NAME OF MEDICATION: albuterol and prednisone  DATE MEDICATION PICKED UP FROM PHARMACY: few days later    Do you have a follow up appointment with your PCP or pulmonologist?   (YES/NO): y  DATE OF APPOINTMENT: 05/05/17 with Dr Kyla Balzarinearmichael    Do you feel that your symptoms are controlled at this time?   (YES/NO):yes    Do you have any questions that I can help you with at this time:   (YES/NO): denies    Thank you for your time today, we look forward to seeing you at your appointment. If you would experience a change in your symptoms or questions before that time we have an RN available 24/7 by calling [phone] and selecting option 3.

## 2017-05-05 ENCOUNTER — Encounter: Attending: Internal Medicine | Primary: Internal Medicine

## 2017-05-05 NOTE — Telephone Encounter (Signed)
Left message for pt to call back to r/s 6 week follow up appt. Please help the pt with this and then document when completed. Thanks!

## 2017-05-06 NOTE — Telephone Encounter (Signed)
My name is (nurses name), I am a registered nurse calling on behalf of [facility], I see you were recently evaluated in (ER LOCATION) for asthma related symptoms and I wanted to follow up with you to make sure that your symptoms have improved and answer any questions you may have. You can speak to one of our nurses 24/7 at [phone] Option 3. Thank you. [phone]

## 2017-05-06 NOTE — Telephone Encounter (Signed)
Left message on pt's voice mail for him to call back to r/s his missed  6 week follow up appt appt. Please help the pt with this and then document when completed. Thanks!

## 2017-05-07 NOTE — Telephone Encounter (Signed)
Left message on pt's voice mail for him to call back to r/s his missed  6 week follow up appt appt. Please help the pt with this and then document when completed. Thanks!    Sending Letter

## 2017-08-07 MED ORDER — SILDENAFIL CITRATE 50 MG PO TABS
50 | ORAL_TABLET | ORAL | 3 refills | Status: DC | PRN
Start: 2017-08-07 — End: 2018-06-08

## 2017-08-07 NOTE — Telephone Encounter (Signed)
Name of Caller: Stephen Riley    Relationship to Patient: patient    Contact phone number:  [phone]    Medication Name: Viagra 100 Mg tablet     Ordering Physician: Dr. Lovenia Shuck    Pharmacy Name:  [facility] [address] [place] [phone]    Pharmacy Address:       Pharmacy Phone Number:     Date of last office visit: 02-25-17    Date of next office visit: 08-29-17    Date of Last Refill: (See Medication Tab):     Updated/Validated Preferred Pharmacy: Yes    Patient instructed to contact the pharmacy prior to picking up the medication: Yes

## 2017-08-07 NOTE — Telephone Encounter (Signed)
Last  appointment 02/25/2017,  Next appointment is 08/29/2017

## 2017-08-29 ENCOUNTER — Encounter: Attending: Internal Medicine | Primary: Internal Medicine

## 2017-11-10 ENCOUNTER — Encounter

## 2017-11-10 NOTE — Telephone Encounter (Signed)
From: Netta NeatVictor V Berringer  To: Argie Ramminghristopher L Carmichael, DO  Sent: 11/10/2017 2:15 PM EDT  Subject: Prescription Question    If able to get a prescription for Chantix, [facility] [address] [place] .

## 2017-11-10 NOTE — Telephone Encounter (Signed)
From: Netta NeatVictor V Hull  To: Argie Ramminghristopher L Carmichael, DO  Sent: 11/10/2017 2:13 PM EDT  Subject: Prescription Question    Wanted to know if I could get a prescription for Chantix

## 2017-11-11 MED ORDER — VARENICLINE TARTRATE 0.5 MG X 11 & 1 MG X 42 PO MISC
0.5 MG X 11 & 1 MG X 42 | ORAL_TABLET | ORAL | 0 refills | Status: DC
Start: 2017-11-11 — End: 2018-12-07

## 2017-11-11 MED ORDER — VARENICLINE TARTRATE 1 MG PO TABS
1 | ORAL_TABLET | Freq: Two times a day (BID) | ORAL | 3 refills | Status: DC
Start: 2017-11-11 — End: 2018-12-07

## 2018-05-07 ENCOUNTER — Encounter

## 2018-05-07 NOTE — Telephone Encounter (Signed)
Refills declined. Please schedule Stephen Riley for follow-up with me. If he needs a limited script for albuterol to get him to follow-up let me know and I can send it in. Thanks.

## 2018-05-07 NOTE — Telephone Encounter (Signed)
Name of Caller: Stephen Riley     Relationship to Patient: patient    Contact phone number:  Verified     Medication Name:   sildenafil (VIAGRA) 50 MG tablet    albuterol sulfate HFA 108 (90 Base) MCG/ACT inhaler    If this is a controlled substance do you receive this or any other controlled medication from any other doctor or facility? No or N/A    Date of Last Refill: (See Medication Tab):     Date of last office visit: 11.13.18    Date of next office visit: none     Ordering Physician: Abigail Butts     Pharmacy Name:  [id number] - [place], [place] - [address] - [place] [phone] Carmon Ginsberg [phone]         Pharmacy Address:  =     Pharmacy Phone Number: =    Updated/Validated Preferred Pharmacy: Yes    Patient instructed to contact the pharmacy prior to picking up the medication: Yes

## 2018-05-08 NOTE — Telephone Encounter (Signed)
Left a message to return call. Please schedule appointment with PCP.

## 2018-05-11 NOTE — Telephone Encounter (Signed)
Scheduled.

## 2018-05-14 ENCOUNTER — Encounter: Attending: Internal Medicine | Primary: Internal Medicine

## 2018-06-08 ENCOUNTER — Ambulatory Visit
Admit: 2018-06-08 | Discharge: 2018-06-08 | Payer: PRIVATE HEALTH INSURANCE | Attending: Internal Medicine | Primary: Internal Medicine

## 2018-06-08 DIAGNOSIS — G43109 Migraine with aura, not intractable, without status migrainosus: Secondary | ICD-10-CM

## 2018-06-08 MED ORDER — ALBUTEROL SULFATE HFA 108 (90 BASE) MCG/ACT IN AERS
108 (90 Base) MCG/ACT | Freq: Four times a day (QID) | RESPIRATORY_TRACT | 1 refills | Status: DC | PRN
Start: 2018-06-08 — End: 2019-06-09

## 2018-06-08 MED ORDER — SILDENAFIL CITRATE 50 MG PO TABS
50 | ORAL_TABLET | ORAL | 1 refills | Status: DC | PRN
Start: 2018-06-08 — End: 2018-12-07

## 2018-06-08 NOTE — Progress Notes (Signed)
Subjective:     Patient: Stephen Riley is a 53 y.o. male    Thurlo presents for follow-up.    He went to [facility] ED 2 months ago for right sided facial rash and was diagnosed with shingles and treated with a week of Valtrex. The rash has significantly improved, still some scarring. No vision changes.     He has a hx of complex migraines involving left arm sensory changes. In 02/2017, the last time he was seen here, he was started on Topamax and referred to neurology. Not taking Topamax and has not seen neuro. Excedrin PRN is helpful. Last migraine was about a month ago.  ??  He has a reported hx of asthma for which he uses albuterol, but none in the last two weeks. PFTs ordered in the past but not completed. Has had some cough lately. Smoking about 1/2 ppd. Not using Chantix due to bad dreams. Felt lozenges were too strong.   ??  Has been referred to [facility] and [facility] for colonoscopy.        Review of Systems   Respiratory: Positive for cough. Negative for shortness of breath and wheezing.    Skin: Positive for rash.   Neurological: Negative for headaches.        No Known Allergies  Current Outpatient Medications on File Prior to Visit   Medication Sig Dispense Refill   ??? ibuprofen (ADVIL;MOTRIN) 800 MG tablet Take 800 mg by mouth     ??? aspirin-acetaminophen-caffeine (EXCEDRIN MIGRAINE) 250-250-65 MG per tablet Take 1 tablet by mouth 2 times daily as needed for Headaches 90 tablet 3   ??? varenicline (CHANTIX STARTING MONTH PAK) 0.5 MG X 11 & 1 MG X 42 tablet Take by mouth. (Patient not taking: Reported on 06/08/2018) 53 tablet 0   ??? varenicline (CHANTIX CONTINUING MONTH PAK) 1 MG tablet Take 1 tablet by mouth 2 times daily (Patient not taking: Reported on 06/08/2018) 60 tablet 3     No current facility-administered medications on file prior to visit.       Patient Active Problem List   Diagnosis   ??? GERD (gastroesophageal reflux disease)   ??? Erectile dysfunction   ??? Chronic back pain   ??? Asthma   ??? Migraine with aura       Social History     Tobacco Use   ??? Smoking status: Current Every Day Smoker     Packs/day: 0.50     Types: Cigarettes     Start date: 11/02/1985   ??? Smokeless tobacco: Never Used   Substance Use Topics   ??? Alcohol use: No      Family History   Problem Relation Age of Onset   ??? Asthma Mother    ??? No Known Problems Son    ??? No Known Problems Daughter    ??? Breast Cancer Maternal Aunt    ??? Breast Cancer Maternal Uncle         throat   ??? Breast Cancer Maternal Grandfather         pancreatic   ??? Breast Cancer Maternal Uncle          Objective:     BP 130/79    Pulse 71    Temp 97.8 ??F (36.6 ??C) (Temporal)    Ht 6\' 3"  (1.905 m)    Wt 199 lb (90.3 kg)    SpO2 93%    BMI 24.87 kg/m??     Physical Exam  Vitals signs  reviewed.   Constitutional:       Appearance: Normal appearance.   HENT:      Head: Normocephalic and atraumatic.   Cardiovascular:      Rate and Rhythm: Normal rate and regular rhythm.      Heart sounds: Normal heart sounds. No murmur. No friction rub. No gallop.    Pulmonary:      Effort: Pulmonary effort is normal.      Breath sounds: Normal breath sounds. No stridor. No wheezing, rhonchi or rales.   Skin:     General: Skin is warm and dry.      Comments: Healing shingles lesions with some hyperpigmentation and scaring and patches on the right V1 Area of the face. No Hutchinson sign.    Neurological:      General: No focal deficit present.      Mental Status: He is alert and oriented to person, place, and time.   Psychiatric:         Mood and Affect: Mood normal.         Behavior: Behavior normal.         Thought Content: Thought content normal.         Judgment: Judgment normal.         Assessment/Plan      1. Migraine with aura and without status migrainosus, not intractable  - [facility] [place]  HAs have generally been controlled with Excedrin but he's interested in seeing neurology so referral placed.     2. Mild intermittent asthma without complication  - albuterol sulfate HFA 108 (90 Base)  MCG/ACT inhaler; Inhale 2 puffs into the lungs every 6 hours as needed for Wheezing  Dispense: 1 Inhaler; Refill: 1  Continue PRN albuterol.  If condition worsens or exacerbations because more frequent, then he needs to get PFTs.     3. Zoster ophthalmicus  No need for ophtho referral as lesions are healed over without vision involvement. Denies postherpetic pain.     4. Tobacco abuse  Encouraged cessation.     5. Vasculogenic erectile dysfunction, unspecified vasculogenic erectile dysfunction type  - sildenafil (VIAGRA) 50 MG tablet; Take 1 tablet by mouth as needed for Erectile Dysfunction  Dispense: 30 tablet; Refill: 1    6. Colon cancer screening  - [facility] - Suzan Nailer, [place]  I advised he call to schedule.     7. Need for immunization against influenza  He declines.         Argie Ramming, DO  06/08/18  7:38 AM

## 2018-07-29 NOTE — Telephone Encounter (Addendum)
Called pt to change appt on 08/04/2018 at 10:40AM with Dr. Hewitt Shorts to a Virtual Visit or r/s in May 2020. LM on VM for a call back, office phone number provided.    Appt time and date needs to be changed as well. He is a new patient and needs 40 minutes.

## 2018-07-29 NOTE — Telephone Encounter (Signed)
error 

## 2018-07-31 NOTE — Telephone Encounter (Signed)
Attempted to reach patient as we need to reschedule him to another day as he needs a 40 minute time slot for his appointment.  His voicemail was full at this time.

## 2018-08-03 NOTE — Telephone Encounter (Addendum)
Attempted to call patient to change appointment from a office visit to a telephone visit. Left mesaage on answering service.

## 2018-08-04 ENCOUNTER — Ambulatory Visit
Admit: 2018-08-04 | Discharge: 2018-08-04 | Payer: PRIVATE HEALTH INSURANCE | Attending: Neurology | Primary: Internal Medicine

## 2018-08-04 DIAGNOSIS — G43109 Migraine with aura, not intractable, without status migrainosus: Secondary | ICD-10-CM

## 2018-08-04 MED ORDER — NORTRIPTYLINE HCL 25 MG PO CAPS
25 MG | ORAL_CAPSULE | Freq: Every evening | ORAL | 2 refills | Status: DC
Start: 2018-08-04 — End: 2019-06-09

## 2018-08-04 NOTE — Progress Notes (Signed)
Department of Neurological Sciences    08/04/18     Patient presents for a Telehealth visit today. In response to the COVID-19 outbreak, non-life threatening appointments are now conducted over telemetry. The patient consented to a Telehealth encounter with myself. That patient voiced understanding of the Telehealth visit.  I used the following Telehealth technology: Audio capability only. The patient was offered and advised video for a more comprehensive evaluation, but the patient declined or was unable to use video. Due to the outbreak of COVID-19 and protecting patient safety, no vitals were obtained and no physical exam was performed in person.    CHIEF COMPLAINT:   Chief Complaint   Patient presents with   ??? New Patient     migraines          HISTORY OF PRESENT ILLNESS:     The patient is a 53 y.o.  male with a history of GERD, migraines, stroke who presents with evaluation of migraines.  He reports that headaches began >5 years ago and now occur weekly.  He describes holoacranial and right more than left sided pain.  Pain severity is 8 out of 10.  Headaches can last hours or days.  Headaches are "aching".  Worsened by physical activity and relieved by rest.  They can be preceded by "fluorescent green floaters" lasting minutes.  Headaches interfere with activities of daily living once a month.  Reports being seen in the emergency room 2 or 3 times in the last year for treatment of headache.  Headaches are not associated with pulsatile tinnitus or jaw claudication.  On 2 occasions headaches were accompanied by left upper extremity numbness.  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  EEG was read as unremarkable.  His second episode of left upper extremity numbness occurred on 2018, he was seen at [facility]  Workup included CT of the head, CTA IC, CT perfusion.   Carotid ultrasound showed mild atherosclerotic disease. Symptoms were attributed to complex migraine.  He affirm inadequate exercise, smokes tobacco.  He is not aware of any olfactory dietary or hormonal triggers for migraine.  He uses Excedrin Migraine with moderate relief of symptoms.  He was never placed on migraine prophylaxis.            Past Medical History:    Past Medical History:   Diagnosis Date   ??? Asthma    ??? Chronic back pain    ??? Erectile dysfunction    ??? GERD (gastroesophageal reflux disease)    ??? Migraine         Past Surgical History:   Past Surgical History:   Procedure Laterality Date   ??? CARDIAC CATHETERIZATION      Unremarkable findings at [facility] X2   ??? HAND SURGERY Right    ??? HERNIA REPAIR     ??? ORBITAL FRACTURE SURGERY Left     remote   ??? UMBILICAL HERNIA REPAIR          Medications:   Current Outpatient Medications   Medication Sig Dispense Refill   ??? albuterol sulfate HFA 108 (90 Base) MCG/ACT inhaler Inhale 2 puffs into the lungs every 6 hours as needed for Wheezing 1 Inhaler 1   ??? aspirin-acetaminophen-caffeine (EXCEDRIN MIGRAINE) 250-250-65 MG per tablet Take 1 tablet by mouth 2 times daily as needed for Headaches 90 tablet 3   ??? acetaminophen (  TYLENOL) 325 MG tablet Take 650 mg by mouth every 6 hours as needed for Pain     ??? sildenafil (VIAGRA) 50 MG tablet Take 1 tablet by mouth as needed for Erectile Dysfunction (Patient not taking: Reported on 08/04/2018) 30 tablet 1   ??? varenicline (CHANTIX STARTING MONTH PAK) 0.5 MG X 11 & 1 MG X 42 tablet Take by mouth. (Patient not taking: Reported on 06/08/2018) 53 tablet 0   ??? varenicline (CHANTIX CONTINUING MONTH PAK) 1 MG tablet Take 1 tablet by mouth 2 times daily (Patient not taking: Reported on 06/08/2018) 60 tablet 3   ??? ibuprofen (ADVIL;MOTRIN) 800 MG tablet Take 800 mg by mouth       No current facility-administered medications for this visit.         Allergies:  Patient has no known allergies.    Social History:  Social History      Socioeconomic History   ??? Marital status: Legally Separated     Spouse name: Not on file   ??? Number of children: Not on file   ??? Years of education: Not on file   ??? Highest education level: Not on file   Occupational History   ??? Not on file   Social Needs   ??? Financial resource strain: Not on file   ??? Food insecurity     Worry: Not on file     Inability: Not on file   ??? Transportation needs     Medical: Not on file     Non-medical: Not on file   Tobacco Use   ??? Smoking status: Current Every Day Smoker     Packs/day: 0.50     Types: Cigarettes     Start date: 11/02/1985   ??? Smokeless tobacco: Never Used   Substance and Sexual Activity   ??? Alcohol use: No   ??? Drug use: No   ??? Sexual activity: Not on file   Lifestyle   ??? Physical activity     Days per week: Not on file     Minutes per session: Not on file   ??? Stress: Not on file   Relationships   ??? Social Wellsite geologist on phone: Not on file     Gets together: Not on file     Attends religious service: Not on file     Active member of club or organization: Not on file     Attends meetings of clubs or organizations: Not on file     Relationship status: Not on file   ??? Intimate partner violence     Fear of current or ex partner: Not on file     Emotionally abused: Not on file     Physically abused: Not on file     Forced sexual activity: Not on file   Other Topics Concern   ??? Not on file   Social History Narrative   ??? Not on file        Family History:   Family History   Problem Relation Age of Onset   ??? Asthma Mother    ??? No Known Problems Father    ??? No Known Problems Son    ??? No Known Problems Daughter    ??? Breast Cancer Maternal Aunt    ??? Breast Cancer Maternal Uncle         throat   ??? Breast Cancer Maternal Grandfather         pancreatic   ???  Breast Cancer Maternal Uncle           REVIEW OF SYSTEMS:  Review of Systems   Constitutional: Negative for fever and unexpected weight change.   HENT: Negative for hearing loss, tinnitus and trouble swallowing.     Eyes: Positive for photophobia and visual disturbance.   Respiratory: Positive for chest tightness and shortness of breath. Negative for choking.    Cardiovascular: Positive for chest pain and palpitations.   Gastrointestinal: Negative for abdominal pain and nausea.   Endocrine: Positive for heat intolerance. Negative for cold intolerance.   Genitourinary: Negative for difficulty urinating, dysuria and urgency.   Musculoskeletal: Positive for arthralgias and gait problem. Negative for myalgias.   Neurological: Positive for dizziness, seizures and headaches. Negative for weakness.   Psychiatric/Behavioral: Positive for dysphoric mood. Negative for confusion. The patient is nervous/anxious.         PHYSICAL EXAM:  Vitals:  There were no vitals taken for this visit.    Neurological Examination:  Mental Status: Patient is currently awake, alert, and oriented to person, place, and time.  Able to state the reason for today's visit and can recall events leading up to this visit.  Speech is fluent without any evidence of dysphasia.         Impression:   Diagnosis Orders   1. Migraine with aura and without status migrainosus, not intractable     2. Abnormal head CT     Presents with frequent migrainous headaches.  History of right arm numbness in 2010, thought to have a TIA after [facility] per care everywhere.  Reports an episode of left upper extremity numbness in 2018- differential dx included prolonged migraine aura, focal sensory seizure, recurrent ischemia.    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  No significant vascular disease identified to suggest recurrent TIA.    Plan:   Encouraged to keep a headache diary.  Discussed importance of diet, exercise, sleep and migraine prevention.   Recommend to start nortriptyline for  migraine prophylaxis.  He'll continue Excedrin Migraine as needed.  Would avoid triptans, will consider adding lasmiditan.  Continue vascular risk factor modification, tobacco cessation.  Unclear if cortical encephalomalacia in the right frontal lobe is secondary to remote stroke or trauma that occurred prior to 2010 - would consider baby aspirin for primary stroke prevention as recommended in 2010 unless contraindicated from GI standpoint.         21 or more minutes were spent on the digital evaluation and management of this patient.    Electronically signed by Kateri Plummermitri Marquisha Nikolov, MD on 08/04/18 at 11:00 AM EDT

## 2018-08-05 NOTE — Telephone Encounter (Signed)
Called for a virtual visit, chart updated with histroy and medications

## 2018-11-03 ENCOUNTER — Encounter: Attending: Neurology | Primary: Internal Medicine

## 2018-11-17 ENCOUNTER — Encounter: Attending: Neurology | Primary: Internal Medicine

## 2018-12-07 ENCOUNTER — Telehealth
Admit: 2018-12-07 | Discharge: 2018-12-07 | Payer: PRIVATE HEALTH INSURANCE | Attending: Internal Medicine | Primary: Internal Medicine

## 2018-12-07 DIAGNOSIS — K0889 Other specified disorders of teeth and supporting structures: Secondary | ICD-10-CM

## 2018-12-07 MED ORDER — PENICILLIN V POTASSIUM 500 MG PO TABS
500 | ORAL_TABLET | Freq: Four times a day (QID) | ORAL | 0 refills | Status: AC
Start: 2018-12-07 — End: 2018-12-14

## 2018-12-07 MED ORDER — SILDENAFIL CITRATE 100 MG PO TABS
100 | ORAL_TABLET | ORAL | 3 refills | Status: DC | PRN
Start: 2018-12-07 — End: 2019-06-09

## 2018-12-07 MED ORDER — NAPROXEN 250 MG PO TABS
250 MG | ORAL_TABLET | Freq: Two times a day (BID) | ORAL | 0 refills | Status: DC | PRN
Start: 2018-12-07 — End: 2019-06-09

## 2018-12-07 NOTE — Progress Notes (Signed)
Subjective:     Patient: Stephen Riley is a 53 y.o. male    Stephen Riley presents for follow-up.  ??  Has had dental pain and swelling in the right lower oral cavity for several days. Denies trouble swallowing or drooling. He went to the ED in July for a similar episode and was treated with PCN and NSAID and the pain significant;ly improved. Has not yet seen a dentist.     He has a hx of complex migraines possibly involving left arm sensory changes vs focal seizure vs TIA. He saw Dr. Glenice Riley in 07/2018 and Stephen Riley was started but he is not currently taking it. Other treatments were discussed and ASA for secondary stroke prevention due to an apparent hx of TIA was discussed. He missed the follow-up appt due to the death of his father He uses Excedrin. HAs haven't been bad lately.     He has a reported hx of asthma for which he uses albuterol, 2-3 times in the last weeks. Has some nocturnal wheezing and dyspnea recently due to environmental conditions. PFTs were ordered in the past but not completed. Smoking 1/2 ppd. Not using Chantix due to bad dreams. Felt lozenges were too strong.   ??  Has been referred to Stephen Riley and Stephen Riley for colonoscopy. Has a hx of microcytic anemia. Denies hematochezia or melena.        Review of Systems   HENT: Positive for dental problem.    Respiratory: Positive for shortness of breath.    Gastrointestinal: Negative for blood in stool.   Neurological: Negative for headaches.        No Known Allergies  Current Outpatient Medications on File Prior to Visit   Medication Sig Dispense Refill   ??? acetaminophen (TYLENOL) 325 MG tablet Take 650 mg by mouth every 6 hours as needed for Pain     ??? nortriptyline (Stephen Riley) 25 MG capsule Take 2 capsules by mouth nightly Start 1 capsule at bedtime for 2-4 weeks 60 capsule 2   ??? albuterol sulfate HFA 108 (90 Base) MCG/ACT inhaler Inhale 2 puffs into the lungs every 6 hours as needed for Wheezing 1 Inhaler 1   ??? varenicline (CHANTIX STARTING MONTH PAK) 0.5 MG X  11 & 1 MG X 42 tablet Take by mouth. (Patient not taking: Reported on 06/08/2018) 53 tablet 0   ??? varenicline (CHANTIX CONTINUING MONTH PAK) 1 MG tablet Take 1 tablet by mouth 2 times daily (Patient not taking: Reported on 06/08/2018) 60 tablet 3   ??? aspirin-acetaminophen-caffeine (EXCEDRIN MIGRAINE) 250-250-65 MG per tablet Take 1 tablet by mouth 2 times daily as needed for Headaches 90 tablet 3     No current facility-administered medications on file prior to visit.       Patient Active Problem List   Diagnosis   ??? GERD (gastroesophageal reflux disease)   ??? Erectile dysfunction   ??? Chronic back pain   ??? Asthma   ??? Migraine with aura      Social History     Tobacco Use   ??? Smoking status: Current Every Day Smoker     Packs/day: 0.50     Types: Cigarettes     Start date: 11/02/1985   ??? Smokeless tobacco: Never Used   Substance Use Topics   ??? Alcohol use: No      Family History   Problem Relation Age of Onset   ??? Asthma Mother    ??? No Known Problems Father    ???  No Known Problems Son    ??? No Known Problems Daughter    ??? Breast Cancer Maternal Aunt    ??? Breast Cancer Maternal Uncle         throat   ??? Breast Cancer Maternal Grandfather         pancreatic   ??? Breast Cancer Maternal Uncle          Objective:     There were no vitals taken for this visit.    Physical Exam  Constitutional:       Appearance: Normal appearance.   HENT:      Head: Normocephalic and atraumatic.   Eyes:      General: No scleral icterus.     Extraocular Movements: Extraocular movements intact.   Pulmonary:      Effort: Pulmonary effort is normal. No respiratory distress.   Skin:     Coloration: Skin is not jaundiced or pale.   Neurological:      General: No focal deficit present.      Mental Status: He is alert and oriented to person, place, and time.   Psychiatric:         Mood and Affect: Mood normal.         Behavior: Behavior normal.         Thought Content: Thought content normal.         Judgment: Judgment normal.         Assessment/Plan       1. Odontalgia  - penicillin v potassium (VEETID) 500 MG tablet; Take 1 tablet by mouth 4 times daily for 7 days  Dispense: 28 tablet; Refill: 0  - naproxen (NAPROSYN) 250 MG tablet; Take 1-2 tablets by mouth 2 times daily as needed for Pain  Dispense: 60 tablet; Refill: 0  Will treat as above and strongly encouraged him to [place]     2. Migraine with aura and without status migrainosus, not intractable  Continue PRN Excedrin. He'll follow-up with Dr. Hewitt Riley.     3. Mild intermittent asthma without complication  Continue albuterol for intermittent mild symptoms.   Needs smoking cessation.     4. Microcytic anemia  - CBC Auto Differential; Future  - Ferritin; Future  - Iron and TIBC; Future  - Basic Metabolic Panel; Future  - [facility] [place]    5. Tobacco abuse  Encouraged cessation.     6. Colon cancer screening  [facility]  Will again refer and proved him with the GI phone number to schedule.     7. History of transient ischemic attack (TIA)  - Lipid Panel; Future  Since his hx if TIA is somewhat unclear will hold off on starting ASA and defer this to neurology.     8. Screening for lipid disorders  - Lipid Panel; Future    9. Need for shingles vaccine  Has a hx of shingles.   Will give Shingrix when in the office in 6 months.    Patient was seen today via Telehealth by agreement and consent in light of the current COVID-19 pandemic. I used the following Telehealth technology: Audio and video capabilities. This patient encounter is appropriate and reasonable under the circumstances given the patient's particular presentation at this time. The patient has been advised of the potential risks and limitations of this mode of treatment (including but not limited to the absence of in-person examination) and has agreed to be  treated in a remote fashion in spite of them. Any and all of the patient's/patient's family's questions on this issue have  been answered and I have made no promises or guarantees to the patient. The patient has also been advised to contact this office for worsening conditions or problems, and seek emergency medical treatment and/or call 911 if the patient deems either necessary.        Argie Ramminghristopher L Addaline Peplinski, DO  12/07/18  7:42 AM

## 2018-12-10 ENCOUNTER — Inpatient Hospital Stay
Admit: 2018-12-10 | Discharge: 2018-12-10 | Disposition: A | Discharge status: Home / Self Care | Attending: Emergency Medicine

## 2018-12-10 DIAGNOSIS — M25531 Pain in right wrist: Secondary | ICD-10-CM

## 2018-12-10 MED ORDER — IBUPROFEN 600 MG PO TABS
600 MG | ORAL_TABLET | Freq: Four times a day (QID) | ORAL | 0 refills | Status: DC | PRN
Start: 2018-12-10 — End: 2019-06-09

## 2018-12-10 MED ORDER — ACETAMINOPHEN 325 MG PO TABS
325 MG | ORAL_TABLET | Freq: Four times a day (QID) | ORAL | 3 refills | Status: AC | PRN
Start: 2018-12-10 — End: ?

## 2018-12-10 MED ORDER — IBUPROFEN 600 MG PO TABS
600 MG | Freq: Once | ORAL | Status: AC
Start: 2018-12-10 — End: 2018-12-10
  Administered 2018-12-10: 08:00:00 600 mg via ORAL

## 2018-12-10 MED ORDER — ACETAMINOPHEN 500 MG PO TABS
500 MG | Freq: Once | ORAL | Status: AC
Start: 2018-12-10 — End: 2018-12-10
  Administered 2018-12-10: 08:00:00 1000 mg via ORAL

## 2018-12-10 MED FILL — MAPAP 500 MG PO TABS: 500 mg | ORAL | Qty: 2

## 2018-12-10 MED FILL — IBUPROFEN 600 MG PO TABS: 600 mg | ORAL | Qty: 1

## 2018-12-10 NOTE — Other (Unsigned)
Patient Acct Nbr: [id number]   Primary AUTH/CERT:   Primary Insurance Company Name: Workers Program[employer], applications Plan name: [employer]  [place]  Primary Insurance Group Number: [id number]  Primary Insurance Plan Type: Health  Primary Insurance Policy Number: [id number]    Secondary AUTH/CERT:   Secondary Insurance Company Name: [employer]  Secondary Insurance Plan name: [employer] [place] [employer]  Secondary Insurance Group Number:   Secondary Insurance Plan Type: Health  Secondary Insurance Policy Number: [id number]

## 2018-12-10 NOTE — ED Provider Notes (Signed)
[facility] EMERGENCY DEPT  EMERGENCY DEPARTMENT ENCOUNTER      Pt Name: Stephen Riley  MRN: [id number]  [place] 21-Oct-1965  Date of evaluation: 12/10/2018  Provider: Dahlia Bailiff, MD     CHIEF COMPLAINT       Chief Complaint   Patient presents with   ??? Hand Pain     Pt comes in with c/o R hand pain and swelling. Pt denies injury. Pt states that he woke up this morning ( wednesday) and his hand was swollen and hurting. Pt states that the pain is worse when he opens his hand.          HISTORY OF PRESENT ILLNESS   (Location/Symptom, Timing/Onset, Context/Setting, Quality, Duration, Modifying Factors, Severity) Note limiting factors.   I wore a N95 mask for the entirety of this encounter.  HPI    Stephen Riley is a 53 y.o. male who presents to the emergency department *right wrist pain.  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    REVIEW OF SYSTEMS    (2+ for level 4; 10+ for level 5)   Review of Systems   Constitutional: Negative for fatigue and fever.   HENT: Negative for congestion and ear pain.    Eyes: Negative for pain and discharge.   Respiratory: Negative for cough and shortness of breath.    Cardiovascular: Negative for chest pain and palpitations.   Gastrointestinal: Negative for abdominal pain, diarrhea and vomiting.   Genitourinary: Negative for dysuria and frequency.   Skin: Negative for color change and rash.   Neurological: Negative for dizziness and headaches.   Psychiatric/Behavioral: Negative for agitation and suicidal ideas.       PAST MEDICAL HISTORY     Past Medical History:   Diagnosis Date   ??? Asthma    ??? Chronic back pain    ??? Erectile dysfunction    ??? GERD (gastroesophageal reflux disease)    ??? Migraine        SURGICAL HISTORY       Past Surgical History:   Procedure Laterality Date   ??? CARDIAC  CATHETERIZATION      Unremarkable findings at [facility] X2   ??? HAND SURGERY Right    ??? HERNIA REPAIR     ??? ORBITAL FRACTURE SURGERY Left     remote   ??? UMBILICAL HERNIA REPAIR         CURRENT MEDICATIONS       Previous Medications    ACETAMINOPHEN (TYLENOL) 325 MG TABLET    Take 650 mg by mouth every 6 hours as needed for Pain    ALBUTEROL SULFATE HFA 108 (90 BASE) MCG/ACT INHALER    Inhale 2 puffs into the lungs every 6 hours as needed for Wheezing    ASPIRIN-ACETAMINOPHEN-CAFFEINE (EXCEDRIN MIGRAINE) 250-250-65 MG PER TABLET    Take 1 tablet by mouth 2 times daily as needed for Headaches    NAPROXEN (NAPROSYN) 250 MG TABLET    Take 1-2 tablets by mouth 2 times daily as needed for Pain    NORTRIPTYLINE (PAMELOR) 25 MG CAPSULE    Take 2 capsules by mouth nightly Start 1 capsule at bedtime for 2-4 weeks    PENICILLIN V POTASSIUM (VEETID) 500 MG TABLET    Take 1 tablet  by mouth 4 times daily for 7 days    SILDENAFIL (VIAGRA) 100 MG TABLET    Take 1 tablet by mouth as needed for Erectile Dysfunction       ALLERGIES     Patient has no known allergies.    FAMILY HISTORY       Family History   Problem Relation Age of Onset   ??? Asthma Mother    ??? No Known Problems Father    ??? No Known Problems Son    ??? No Known Problems Daughter    ??? Breast Cancer Maternal Aunt    ??? Breast Cancer Maternal Uncle         throat   ??? Breast Cancer Maternal Grandfather         pancreatic   ??? Breast Cancer Maternal Uncle         SOCIAL HISTORY       Social History     Socioeconomic History   ??? Marital status: Legally Separated     Spouse name: Not on file   ??? Number of children: Not on file   ??? Years of education: Not on file   ??? Highest education level: Not on file   Occupational History   ??? Not on file   Social Needs   ??? Financial resource strain: Not on file   ??? Food insecurity     Worry: Not on file     Inability: Not on file   ??? Transportation needs     Medical: Not on file     Non-medical: Not on file   Tobacco Use   ??? Smoking status: Current  Every Day Smoker     Packs/day: 0.50     Types: Cigarettes     Start date: 11/02/1985   ??? Smokeless tobacco: Never Used   Substance and Sexual Activity   ??? Alcohol use: No   ??? Drug use: No   ??? Sexual activity: Not on file   Lifestyle   ??? Physical activity     Days per week: Not on file     Minutes per session: Not on file   ??? Stress: Not on file   Relationships   ??? Social Wellsite geologistconnections     Talks on phone: Not on file     Gets together: Not on file     Attends religious service: Not on file     Active member of club or organization: Not on file     Attends meetings of clubs or organizations: Not on file     Relationship status: Not on file   ??? Intimate partner violence     Fear of current or ex partner: Not on file     Emotionally abused: Not on file     Physically abused: Not on file     Forced sexual activity: Not on file   Other Topics Concern   ??? Not on file   Social History Narrative   ??? Not on file       SCREENINGS           PHYSICAL EXAM    (up to 7 for level 4, 8 or more for level 5)     ED Triage Vitals   BP Temp Temp Source Pulse Resp SpO2 Height Weight   12/10/18 0205 12/10/18 0205 12/10/18 0205 12/10/18 0205 12/10/18 0205 12/10/18 0205 12/10/18 0212 12/10/18 0212   119/75 98.2 ??F (36.8 ??C) Temporal 83 16 98 % 6\' 3"  (  1.905 m) 215 lb (97.5 kg)       Physical Exam  Constitutional:       General: He is not in acute distress.     Appearance: He is not ill-appearing or toxic-appearing.   HENT:      Head: Normocephalic and atraumatic.   Eyes:      Conjunctiva/sclera: Conjunctivae normal.   Neck:      Musculoskeletal: Normal range of motion.      Trachea: Trachea normal.   Cardiovascular:      Heart sounds: No murmur. No friction rub.   Pulmonary:      Effort: Pulmonary effort is normal. No respiratory distress.      Breath sounds: Normal breath sounds.   Abdominal:      Palpations: Abdomen is soft.      Tenderness: There is no abdominal tenderness.   Musculoskeletal: Normal range of motion.      Comments:  compartments of the forearm are soft.  Mild right wrist swelling minimal pain with range of motion of the right wrist.  Median radial ulnar nerves intact.  Pain palpation of the median nerve.  Good pulses in the wrist.  Normal flexion and extension of all fingers.  No signs of external trauma   Skin:     General: Skin is warm and dry.   Neurological:      Mental Status: He is alert and oriented to person, place, and time.      Sensory: No sensory deficit.   Psychiatric:         Mood and Affect: Affect is not inappropriate.         Thought Content: Thought content does not include suicidal plan.         DIAGNOSTIC RESULTS     EKG (Per Emergency Physician):       RADIOLOGY (Per Emergency Physician):       Interpretation per the Radiologist below, if available at the time of this note:  No results found.    ED BEDSIDE ULTRASOUND:   Performed by ED Physician - none    LABS:  Labs Reviewed - No data to display     All other labs were within normal range or not returned as of this dictation.    EMERGENCY DEPARTMENT COURSE and DIFFERENTIAL DIAGNOSIS/MDM:   Vitals:    Vitals:    12/10/18 0205 12/10/18 0212   BP: 119/75    Pulse: 83    Resp: 16    Temp: 98.2 ??F (36.8 ??C)    TempSrc: Temporal    SpO2: 98%    Weight:  97.5 kg (215 lb)   Height:  6\' 3"  (1.905 m)       Medications - No data to display    MDM.  Patient presented with right forearm and wrist pain.  He has positive Tinel's sign.  No other traumatic injury would recommend x-ray at this time.  We will put him in a wrist splint starting anti-inflammatories give more pain surgery follow-up.  Suspect musculoskeletal strain versus underlying carpal tunnel syndrome    REVAL:         CRITICAL CARE TIME   Total Critical Care time was 0 minutes, excluding separately reportable procedures.  There was a high probability of clinically significant/life threatening deterioration in the patient's condition which required my urgent intervention.      CONSULTS:  None    PROCEDURES:  Unless otherwise noted below, none     Procedures  FINAL IMPRESSION    No diagnosis found.      DISPOSITION/PLAN   DISPOSITION        PATIENT REFERRED TO:  No follow-up provider specified.    DISCHARGE MEDICATIONS:  New Prescriptions    No medications on file          (Please note:  Portions of this note were completed with a voice recognition program.  Efforts were made to edit the dictations but occasionally words and phrases are mis-transcribed.)  Form [id number].[id number]    Serita KyleQuentin R Letasha Kershaw, MD (electronically signed)  Emergency Medicine Provider       Serita KyleQuentin R Mairlyn Tegtmeyer, MD  12/10/18 [phone]

## 2018-12-10 NOTE — ED Notes (Signed)
velcro wrist splint applied to R wrist. Circulation and sensory intact after application. Pt states he is unable to move fingers before or after application. Education provided to pt. No questions asked of RN.      Danella Penton, RN  12/10/18 [phone]

## 2018-12-17 NOTE — Telephone Encounter (Signed)
Patient seen in [facility] ED on 12/10/18  Reason: Hand Pain    ED visits in last yr: 4   Hosp/ED adm risk %: 72    Per ED discharge instructions:      Schedule an appointment with Arline Asp, DO (Internal Medicine); As needed     Attempted to contact patient, check status and assist with follow up per discharge instructions with the following results:     Have these symptoms resolved? Patient reports swelling has subsided, slight pain continues. Patient reports he had been off work for a while and returned doing 8 hours of repetitive hand movement causing the pain and swelling. Patient reports wearing splint while at work and doing ROM and stretches with hand/wrist when home. Continues to take medications prescribed at ED, has almost completed them.     Patient reports planning on getting routine blood work done next Monday and getting colonoscopy scheduled.     Are you taking medications as prescribed? yes    Any problems? Not at this time      Follow up plan: Patient declined ED follow up at this time. Encouraged patient to call office with any questions or concerns.     Anything else I can help with: not at this time.     If symptoms worsen or fail to improve please call our office we have same day and urgent appointments available. We can schedule you an ED follow up apt. Pt expressed understanding.

## 2019-02-23 NOTE — Telephone Encounter (Signed)
Referral mailed

## 2019-03-03 MED ORDER — ATORVASTATIN CALCIUM 40 MG PO TABS
40 | ORAL | Status: DC
Start: 2019-03-03 — End: 2019-03-03

## 2019-03-03 MED ORDER — ENOXAPARIN SODIUM 40 MG/0.4ML SC SOLN
40 | SUBCUTANEOUS | Status: DC
Start: 2019-03-03 — End: 2019-03-03

## 2019-03-03 MED ORDER — ONDANSETRON HCL 4 MG/2ML IJ SOLN
4 | INTRAMUSCULAR | Status: DC | PRN
Start: 2019-03-03 — End: 2019-03-03

## 2019-03-03 MED ORDER — GENERIC EXTERNAL MEDICATION
Status: DC
Start: 2019-03-03 — End: 2019-03-03

## 2019-03-03 MED ORDER — ALBUTEROL SULFATE (2.5 MG/3ML) 0.083% IN NEBU
RESPIRATORY_TRACT | Status: DC | PRN
Start: 2019-03-03 — End: 2019-03-03

## 2019-03-03 MED ORDER — GENERIC EXTERNAL MEDICATION
Status: DC | PRN
Start: 2019-03-03 — End: 2019-03-03

## 2019-03-03 MED ORDER — ASPIRIN 81 MG PO CHEW
81 | ORAL | Status: DC
Start: 2019-03-03 — End: 2019-03-03

## 2019-03-04 MED ORDER — GENERIC EXTERNAL MEDICATION
Status: DC
Start: 2019-03-04 — End: 2019-03-04

## 2019-03-04 NOTE — Telephone Encounter (Addendum)
The opening has been taken. I could see him at 7:45 on 11/23 or simply follow-up as scheduled on 2/24. Thanks.

## 2019-03-04 NOTE — Telephone Encounter (Signed)
Discharge Summaries  - documented in this encounter  Gabriel Rung - 03/03/2019 12:04 PM EST  Formatting of this note might be different from the original.      DISCHARGE SUMMARY     PATIENT NAME: Stephen Riley Code Status: Full Code   MRN: [id number]     Highest Readmission Risk Score: 8   The 30 day readmissions risk score is derived from an internally validated risk model which evaluates patient level characteristics, utilization history, medication orders and lab results up until the day of discharge. Patients with a score of 40 or above are considered highest risk for readmission. Specific patient level drivers will be listed at the bottom of the summary.    Admission Information   Admission Information   ADMIT DATE: 03/01/2019  DISCHARGE DATE: 03/03/2019    MY DOCTORS AND MEDICAL TEAM:  My Main Hospital Doctor: Gabriel Rung  Primary Care Provider: Lovenia Shuck, DO  My Medical Team Members: Treatment Team:   Attending Provider: Gabriel Rung  Primary Service: [employer]    MY CONDITION AT DISCHARGE: Stable    REASON I WAS IN THE HOSPITAL:     SUMMARY OF WHAT HAPPENED WHILE I WAS IN THE HOSPITAL:   Patient was admitted for . Mr. Volkov is 53 years old man with past medical history of acute a   stroke, hyperlipidemia, tobacco use, BPH, asthma and migraine. He   presented to the hospital with a complaint of headache that he started in   the right side and progressed to involve the whole head, blurry vision in   the left eye and left arm numbness. He is to take Excedrin for his   migraine but this time the Excedrin did not help his headache. Initially   it was thought his presentation was likely due to acute stroke or complex   migraine or focal seizure. CT brain was negative for acute intracranial   process but showed a stable small chronic right parietal infarct. CTA   head and neck was negative for significant stenosis, dissection or   aneurysm. 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 EEG was within normal   limits without epileptiform discharges or EEG seizure. CMP and CBC were   unremarkable. Echocardiogram showed normal LVEF of 60% with no central   valvular abnormalities but showed patent foramen ovale. EKG showed sinus   rhythm and telemetry showed sinus rhythm without significant arrhythmia. LDL was 87.  It was thought his symptoms are likely due to complex migraine. He was   treated with Tylenol. Patient was reluctant to go on any daily   prophylactic medications; it was recommended to avoid triptans due to   history of a stroke and prior cocaine use. He was recommended aspirin and statin for   secondary stroke prevention. He will be discharged home. He has been   recommended to follow with PCP in a week. He can follow up with cardiologist for patent foramen ovale.    OTHER PROBLEMS/DIAGNOSIS:  Active Problems:  Migraine  Asthma  Tobacco use disorder  Patent foramen ovale  Resolved Problems:  * No resolved hospital problems. *    OPERATIONS PERFORMED WHILE IN THE HOSPITAL: None     IMPORTANT TEST/PROCEDURES:   No procedures performed  MRI  Echocardiogram         TEST  RESULTS NOT AVAILABLE AT THIS TIME:   No pending results     Discharge Disposition   Discharge Disposition: Home With Self Care     Activity When You Leave the Hospital   Resume pre-hospital activity     Diet Instructions   Resume your pre-hospital diet     Follow Up Appointments   Follow-Up Appointment - Your PCP   When: In 1 week   Patient/Parents to call for appointment?: Yes   Argie Ramminghristopher L Maureena Dabbs, DO   [phone]   [facility] [place]  [address]  [place] [id number]     PCP Requested Referral   Follow-Up Appointment - Your PCP   With: Your PCP   When: In 1 week   Patient/Parents to call for appointment?: Yes       Additional Provider to Provider Information:    Mr. Lynford HumphreyDickens is [age] years old man with past medical history of acute a stroke, hyperlipidemia, tobacco use, BPH, asthma and migraine. He presented to the hospital with a complaint of headache that he started in the right side and progressed to involve the whole head, blurry vision in the left eye and left arm numbness. He is to take Excedrin for his migraine but this time the Excedrin did not help his headache. Initially it was thought his presentation was likely due to acute stroke or complex migraine or focal seizure. CT brain was negative for acute intracranial process but showed a stable small chronic right parietal infarct. CTA head and neck was negative for significant stenosis, dissection or aneurysm. 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 EEG was within normal limits without epileptiform discharges or EEG seizure. CMP and CBC were unremarkable. Echocardiogram showed normal LVEF of 60% with no central valvular abnormalities but showed patent foramen ovale. EKG showed sinus rhythm and telemetry showed sinus rhythm without significant arrhythmia. LDL was 87. It was thought his symptoms are likely due to complex migraine. He was treated with Tylenol. Patient was reluctant to go on any daily prophylactic medications; it was recommended to avoid triptans due to history of a stroke and prior cocaine use. He was recommended aspirin and statin for secondary stroke prevention. He will be discharged home. He has been recommended to follow with PCP in a week. He can follow-up with cardiologist regarding patent foramen ovale    Treatment Team:   Attending Provider: Gabriel RungGanesh Chaudhary  Primary Service: [employer]  Transitions of Care Critical Issues:  KEY MEDICATION CHANGES: Aspirin and statin started.    LABS AND PROCEDURES PENDING AT DISCHARGE: No pending results.            FOLLOW-UP APPOINTMENTS ALREADY SCHEDULED WITH A [place] [facility] PROVIDER:  No future appointments.    ALLERGIES   Allergen Reactions   ??? Iodine Hives     DISCHARGE MEDICATION: Current Discharge Medication List    START taking these medications    acetaminophen (TYLENOL) 650 mg  650 mg by ORAL/FEEDING TUBE route every 6 hours as needed for Pain.    Refills: 0    aspirin 81 mg  81 mg by ORAL/FEEDING TUBE route once daily.    Qty: 30 tablet Refills: 0    atorvastatin (LIPITOR) 40 mg  Take 40 mg by mouth once daily.    Qty: 30 tablet Refills: 0    CONTINUE  these medications which have NOT CHANGED    albuterol HFA (PROVENTIL HFA, VENTOLIN HFA) 1-2 Puffs  Inhale 1-2 Puffs as instructed four times daily as needed for Wheezing/Shortness of Breath.    Qty: 1 Inhaler Refills: 0  Comments: Generic or brand: dispense inhaler preferred by patient/insurance unless DAW flag is selected.    albuterol (PROVENTIL) 2 Puffs  Inhale 2 Puffs as instructed every 6 hours.  Qty: 1 Inhaler Refills: 0    STOP taking these medications    valACYclovir (VALTREX) 1,000 mg  Comments:  Reason for Stopping:    Discharge Physical Exam:  VITAL SIGNS: BP 148/85   Pulse 62   Temp 36.4 ??C (97.5 ??F) (Oral)   Resp 18   Ht 190.5 cm (6\' 3" )   Wt 95.7 kg (210 lb 15.7 oz)   SpO2 98%   BMI 26.37 kg/m??     GENERAL: Alert, no distress, cooperative,  SKIN: warm  OROPHARYNX: Lips and mucosa normal.   LUNGS: Lungs clear to auscultation, Air entry good, Unlabored breathing.  CARDIAC: Normal S1 and S2; no rubs, murmurs, or gallops  ABDOMEN: Abdomen soft, non-tender, non-distended, BS normal  EXTREMITIES: Normal, no deformities, edema, clubbing or skin discoloration.   NEURO: Awake alert and oriented. Pupils round and reactive. EOM full. Field of vision normal. Speech clear. Face symmetrical. Strength 5/5 in all 4 limbs. Sensory intact bilaterally. Finger-to-nose test normal bilaterally.      The patient's risk for 30-day readmission is determined using the  following contributing factors:  Pt variables contributing to increased readmission risk:     10 Most Recent BUN Result   9.1 First Resulted Calcium During Admission   8 Active Medication Orders   3 Number of Previous ED Visits (6 mos.)   1 Previous ED Visit (6 mos.)?   1 Insurance - Medicaid   1 Discharge Disposition - Home   1 Active Anticoagulant       TIME OF CARE: Discharge Management: I personally spent greater than 30 minutes involved in the discharge management of this patient.    SIGNATURE: Katrine Coho, MD PAGER/CONTACT #:   DATE: March 03, 2019   TIME: 12:04 PM   ??   Electronically signed by Katrine Coho at 03/03/2019 12:05 PM EST??

## 2019-03-04 NOTE — Telephone Encounter (Signed)
1. Did you get medications filled and taking them as instructed from discharge?  2. Are you following your discharge instructions from your hospital stay?  3. Please confirm patient is scheduled for a follow up appointment within the above time frame.    Called and spoke with pt. States he could not talk at the moment. Right now there is a 8am currently open for a VV. If it is still open please schedule pt in that slot

## 2019-03-04 NOTE — Telephone Encounter (Signed)
The 8:00 tomorrow is currently open if needed for scheduling TOC appt. VV would be ok. Thanks.         [facility] Discharge Day Notification           Patient discharged yesterday 11/18. Please see discharge summary under note section for details and info for follow up call. Thank you.    INSTRUCTIONS TO MA/SW: Please call patient on day after discharge (must document patient  contacted within 2 business days of discharge).    FOLLOW UP QUESTIONS FOR MA/SW:  1. Did you get medications filled and taking them as instructed from discharge?  2. Are you following your discharge instructions from your hospital stay?  3. Please confirm patient is scheduled for a follow up appointment within the above time frame.

## 2019-03-05 NOTE — Telephone Encounter (Signed)
Pt scheduled for a hospital follow up virtual visit

## 2019-03-08 ENCOUNTER — Encounter: Attending: Internal Medicine | Primary: Internal Medicine

## 2019-03-08 NOTE — Telephone Encounter (Signed)
SECOND ATTEMPT STILL UNSUCCESSFUL

## 2019-03-08 NOTE — Telephone Encounter (Signed)
PHONE CALL TO PATIENT TO CHECK IN FOR VV.  NO ANSWER.  JUST A MESSAGE STATING WIRELESS CUSTOMER IS UNAVAILABLE TRY CALL AGAIN LATER.  UNABLE TO LEAVE MESSAGE.

## 2019-03-08 NOTE — Telephone Encounter (Signed)
THIRD ATTEMPT, STILL GETTING VM

## 2019-06-04 NOTE — Telephone Encounter (Signed)
Called and lvm to change appt on 06/09/2019 to a vv, please document. Thanks.

## 2019-06-07 NOTE — Telephone Encounter (Signed)
Changed to audio vv

## 2019-06-09 ENCOUNTER — Telehealth
Admit: 2019-06-09 | Discharge: 2019-06-09 | Payer: PRIVATE HEALTH INSURANCE | Attending: Internal Medicine | Primary: Internal Medicine

## 2019-06-09 DIAGNOSIS — G43109 Migraine with aura, not intractable, without status migrainosus: Secondary | ICD-10-CM

## 2019-06-09 MED ORDER — SILDENAFIL CITRATE 100 MG PO TABS
100 | ORAL_TABLET | ORAL | 3 refills | Status: DC | PRN
Start: 2019-06-09 — End: 2019-08-30

## 2019-06-09 MED ORDER — TOPIRAMATE 50 MG PO TABS
50 MG | ORAL_TABLET | Freq: Two times a day (BID) | ORAL | 3 refills | Status: AC
Start: 2019-06-09 — End: ?

## 2019-06-09 MED ORDER — SILDENAFIL CITRATE 100 MG PO TABS
100 | ORAL_TABLET | ORAL | 3 refills | Status: DC | PRN
Start: 2019-06-09 — End: 2019-06-09

## 2019-06-09 MED ORDER — ALBUTEROL SULFATE HFA 108 (90 BASE) MCG/ACT IN AERS
108 (90 Base) MCG/ACT | Freq: Four times a day (QID) | RESPIRATORY_TRACT | 1 refills | Status: DC | PRN
Start: 2019-06-09 — End: 2019-06-15

## 2019-06-09 MED ORDER — NAPROXEN 250 MG PO TABS
250 MG | ORAL_TABLET | Freq: Two times a day (BID) | ORAL | 3 refills | Status: AC | PRN
Start: 2019-06-09 — End: ?

## 2019-06-09 NOTE — Telephone Encounter (Signed)
Resent script

## 2019-06-09 NOTE — Telephone Encounter (Signed)
Left message on vm

## 2019-06-09 NOTE — Telephone Encounter (Signed)
Made pt aware.

## 2019-06-09 NOTE — Telephone Encounter (Signed)
Left message on vm.

## 2019-06-09 NOTE — Telephone Encounter (Addendum)
Jeanclaude called in to get his screening Colonoscopy scheduled.  I scheduled him for Thurs. 09/02/19 at 10:00 am arrive at 9:00 am with Dr. Talmadge Coventry.     EPIC schedule updated  Order submitted  [employer] request submitted   Case # [id number]  Rx Deanna Artis called into pharmacy [facility] [address]@ 3:37 pm   Endo packet mailed to patient

## 2019-06-09 NOTE — Telephone Encounter (Signed)
Called to check in for audio vv.  Left message on vm.

## 2019-06-09 NOTE — Telephone Encounter (Signed)
Correct Pharmacy pended

## 2019-06-09 NOTE — Telephone Encounter (Signed)
Name of Caller: Stephen Riley phone number: [phone]    Relationship to Patient: patient    Provider: Kyla Balzarine    Practice:  [facility]    Chief Complaint/Reason for Call: Patient reported that his Sildenafil (Viagra) was sent to the wrong pharmacy.  Patient reported that he wanted the prescription sent to [facility] [address], [place] - [address] - P [phone] - F [phone].  Please advise.         Best time of day caller can be reached: Any       Patient advised that office/PCP has 24-48 business hours to return their call: No

## 2019-06-15 ENCOUNTER — Emergency Department: Admit: 2019-06-15 | Payer: PRIVATE HEALTH INSURANCE | Primary: Internal Medicine

## 2019-06-15 ENCOUNTER — Inpatient Hospital Stay
Admit: 2019-06-15 | Discharge: 2019-06-15 | Disposition: A | Discharge status: Home / Self Care | Payer: PRIVATE HEALTH INSURANCE | Attending: Emergency Medicine

## 2019-06-15 DIAGNOSIS — J4521 Mild intermittent asthma with (acute) exacerbation: Secondary | ICD-10-CM

## 2019-06-15 LAB — EKG 12-LEAD
Atrial Rate: 90 {beats}/min
P Axis: 51 degrees
P-R Interval: 182 ms
Q-T Interval: 374 ms
QRS Duration: 98 ms
QTc Calculation (Bazett): 457 ms
R Axis: 64 degrees
T Axis: 47 degrees
Ventricular Rate: 90 {beats}/min

## 2019-06-15 MED ORDER — ALBUTEROL SULFATE HFA 108 (90 BASE) MCG/ACT IN AERS
108 (90 Base) MCG/ACT | Freq: Four times a day (QID) | RESPIRATORY_TRACT | 1 refills | Status: DC | PRN
Start: 2019-06-15 — End: 2019-08-04

## 2019-06-15 MED ORDER — PREDNISONE 20 MG PO TABS
20 MG | ORAL_TABLET | Freq: Every day | ORAL | 0 refills | Status: AC
Start: 2019-06-15 — End: 2019-06-20

## 2019-06-15 MED ORDER — DOXYCYCLINE HYCLATE 100 MG PO TABS
100 MG | ORAL_TABLET | Freq: Two times a day (BID) | ORAL | 0 refills | Status: AC
Start: 2019-06-15 — End: 2019-06-25

## 2019-06-15 MED ORDER — PREDNISONE 20 MG PO TABS
20 MG | Freq: Once | ORAL | Status: AC
Start: 2019-06-15 — End: 2019-06-15
  Administered 2019-06-15: 11:00:00 60 mg via ORAL

## 2019-06-15 MED ORDER — IPRATROPIUM-ALBUTEROL 0.5-2.5 (3) MG/3ML IN SOLN
Freq: Once | RESPIRATORY_TRACT | Status: AC
Start: 2019-06-15 — End: 2019-06-15
  Administered 2019-06-15: 11:00:00 1 via RESPIRATORY_TRACT

## 2019-06-15 MED FILL — IPRATROPIUM-ALBUTEROL 0.5-2.5 (3) MG/3ML IN SOLN: RESPIRATORY_TRACT | Qty: 3

## 2019-06-15 MED FILL — PREDNISONE 20 MG PO TABS: 20 mg | ORAL | Qty: 3

## 2019-06-15 NOTE — ED Notes (Signed)
RT aware of tx orders     Garlan Fair, RN  06/15/19 [phone]

## 2019-06-15 NOTE — ED Provider Notes (Signed)
HPI:  06/15/19,   Time: 5:22 AM EST       Stephen Riley is a 54 y.o. male presenting to the ED for shortness of breath, beginning 3 hours ago.  The complaint has been persistent, mild in severity, and worsened by nothing.  The patient states he has a history of asthma.  He states that tonight he began feeling some chest tightness and feeling short of breath.  He states that this is typically how he feels when his asthma acts up.  He states he was trying to find his rescue inhaler but was unable to do so.  Due to this he came to the ED to be evaluated.  Patient does admit to a dry cough.  No nausea vomiting or diarrhea.  No abdominal pain.  No palpitations.  No numbness tingling.    Review of Systems:   Pertinent positives and negatives are stated within HPI, all other systems reviewed and are negative.          --------------------------------------------- PAST HISTORY ---------------------------------------------  Past Medical History:  has a past medical history of Asthma, Chronic back pain, Erectile dysfunction, GERD (gastroesophageal reflux disease), and Migraine.    Past Surgical History:  has a past surgical history that includes Orbital fracture repair (Left); Cardiac catheterization; Umbilical hernia repair; hernia repair; and Hand surgery (Right).    Social History:  reports that he has been smoking cigarettes. He started smoking about 33 years ago. He has been smoking about 0.50 packs per day. He has never used smokeless tobacco. He reports that he does not drink alcohol or use drugs.    Family History: family history includes Asthma in his mother; Breast Cancer in his maternal aunt, maternal grandfather, maternal uncle, and maternal uncle; No Known Problems in his daughter, father, and son.     The patient???s home medications have been reviewed.    Allergies: Patient has no known allergies.        ---------------------------------------------------PHYSICAL  EXAM--------------------------------------    Constitutional/General: Alert and oriented x3, well appearing, non toxic in NAD  Head: Normocephalic and atraumatic  Eyes: PERRL, EOMI, conjunctive normal, sclera non icteric  Mouth: Oropharynx clear, handling secretions, no trismus, no asymmetry of the posterior oropharynx or uvular edema  Neck: Supple, full ROM, non tender to palpation in the midline, no stridor, no crepitus, no meningeal signs  Respiratory: Faint wheezing bilaterally, rales, or rhonchi. Not in respiratory distress  Cardiovascular:  Regular rate. Regular rhythm. No murmurs, gallops, or rubs. 2+ distal pulses  GI:  Abdomen Soft, Non tender, Non distended.  +BS. No organomegaly, no palpable masses,  No rebound, guarding, or rigidity.   Musculoskeletal: Moves all extremities x 4. Warm and well perfused, no clubbing, cyanosis, or edema. Capillary refill <3 seconds  Integument: skin warm and dry. No rashes.   Neurologic: GCS 15, no focal deficits, symmetric strength 5/5 in the upper and lower extremities bilaterally  Psychiatric: Normal Affect    -------------------------------------------------- RESULTS -------------------------------------------------  I have personally reviewed all laboratory and imaging results for this patient. Results are listed below.     LABS:  Results for orders placed or performed during the hospital encounter of 06/15/19   EKG 12 Lead   Result Value Ref Range    Ventricular Rate 90 BPM    Atrial Rate 90 BPM    P-R Interval 182 ms    QRS Duration 98 ms    Q-T Interval 374 ms    QTc Calculation (Bazett) 457 ms  P Axis 51 degrees    R Axis 64 degrees    T Axis 47 degrees       RADIOLOGY:  Interpreted by Radiologist.  XR CHEST PORTABLE   Final Result   Mildly prominent interstitial opacities, acute versus chronic.  Short-term   follow-up recommended.          EKG:  This EKG is signed and interpreted by the EP.    EKG shows normal sinus rhythm 90 bpm.  Minimal LVH noted.   Nonspecific ST-T wave changes noted.  No STEMI.  Normal intervals otherwise.    ------------------------- NURSING NOTES AND VITALS REVIEWED ---------------------------   The nursing notes within the ED encounter and vital signs as below have been reviewed by myself.  BP (!) 166/96    Pulse 104    Temp 97.2 ??F (36.2 ??C) (Tympanic)    Resp 20    Ht 6\' 3"  (1.905 m)    Wt 225 lb (102.1 kg)    SpO2 97%    BMI 28.12 kg/m??   Oxygen Saturation Interpretation: Normal    The patient???s available past medical records and past encounters were reviewed.        ------------------------------ ED COURSE/MEDICAL DECISION MAKING----------------------  Medications   ipratropium-albuterol (DUONEB) nebulizer solution 1 ampule (1 ampule Inhalation Given 06/15/19 0554)   predniSONE (DELTASONE) tablet 60 mg (60 mg Oral Given 06/15/19 0531)         ED COURSE:       Medical Decision Making:    This is a 54 year old male who presented to the ED for shortness of breath.  Patient had faint wheezing was given breathing treatments and steroids.  On reevaluation patient symptoms have resolved.  He states he feels much better.  Currently asymptomatic.  Chest x-ray did show some interstitial opacities but patient has no signs of fluid overload.  Patient offered Covid test but declined.  Patient be discharged home with symptomatic treatment and close follow-up.  Patient symptoms likely related to his asthma.  ACS less likely.  PE less likely.  Return precautions given.  Patient agrees with plan.    I, Dr. 59, am the primary provider for this encounter    This patient's ED course included: a personal history and physicial examination, re-evaluation prior to disposition and multiple bedside re-evaluations    This patient has remained hemodynamically stable during their ED course.      Re-Evaluations:             Re-evaluation.  Patient???s symptoms are improving    Counseling:   The emergency provider has spoken with the patient and discussed  today???s results, in addition to providing specific details for the plan of care and counseling regarding the diagnosis and prognosis.  Questions are answered at this time and they are agreeable with the plan.       --------------------------------- IMPRESSION AND DISPOSITION ---------------------------------    IMPRESSION  1. Mild intermittent asthma with exacerbation    2. Mild intermittent asthma without complication        DISPOSITION  Disposition: Discharge to home  Patient condition is stable    NOTE: This report was transcribed using voice recognition software. Every effort was made to ensure accuracy; however, inadvertent computerized transcription errors may be present        Denita Lung, DO  06/15/19 0602

## 2019-06-29 ENCOUNTER — Inpatient Hospital Stay
Admit: 2019-06-29 | Discharge: 2019-06-29 | Disposition: A | Discharge status: Home / Self Care | Payer: PRIVATE HEALTH INSURANCE | Attending: Emergency Medicine

## 2019-06-29 ENCOUNTER — Emergency Department: Admit: 2019-06-29 | Payer: PRIVATE HEALTH INSURANCE | Primary: Internal Medicine

## 2019-06-29 DIAGNOSIS — J029 Acute pharyngitis, unspecified: Secondary | ICD-10-CM

## 2019-06-29 LAB — COVID-19, RAPID: SARS-CoV-2, NAAT: NOT DETECTED

## 2019-06-29 LAB — CBC WITH AUTO DIFFERENTIAL
Basophils %: 0.4 % (ref 0.0–2.0)
Basophils Absolute: 0.03 E9/L (ref 0.00–0.20)
Eosinophils %: 1.9 % (ref 0.0–6.0)
Eosinophils Absolute: 0.14 E9/L (ref 0.05–0.50)
Hematocrit: 41.7 % (ref 37.0–54.0)
Hemoglobin: 13.4 g/dL (ref 12.5–16.5)
Immature Granulocytes #: 0.01 E9/L
Immature Granulocytes %: 0.1 % (ref 0.0–5.0)
Lymphocytes %: 24.3 % (ref 20.0–42.0)
Lymphocytes Absolute: 1.77 E9/L (ref 1.50–4.00)
MCH: 23.1 pg — ABNORMAL LOW (ref 26.0–35.0)
MCHC: 32.1 % (ref 32.0–34.5)
MCV: 71.9 fL — ABNORMAL LOW (ref 80.0–99.9)
MPV: 11.1 fL (ref 7.0–12.0)
Monocytes %: 8.7 % (ref 2.0–12.0)
Monocytes Absolute: 0.63 E9/L (ref 0.10–0.95)
Neutrophils %: 64.6 % (ref 43.0–80.0)
Neutrophils Absolute: 4.7 E9/L (ref 1.80–7.30)
Platelets: 297 E9/L (ref 130–450)
RBC: 5.8 E12/L (ref 3.80–5.80)
RDW: 16.5 fL — ABNORMAL HIGH (ref 11.5–15.0)
WBC: 7.3 E9/L (ref 4.5–11.5)

## 2019-06-29 LAB — COMPREHENSIVE METABOLIC PANEL
ALT: 23 U/L (ref 0–40)
AST: 30 U/L (ref 0–39)
Albumin: 4.3 g/dL (ref 3.5–5.2)
Alkaline Phosphatase: 88 U/L (ref 40–129)
Anion Gap: 12 mmol/L (ref 7–16)
BUN: 18 mg/dL (ref 6–20)
CO2: 23 mmol/L (ref 22–29)
Calcium: 9.2 mg/dL (ref 8.6–10.2)
Chloride: 103 mmol/L (ref 98–107)
Creatinine: 0.9 mg/dL (ref 0.7–1.2)
GFR African American: >60
GFR Non-African American: >60 mL/min/{1.73_m2} (ref >=60)
Glucose: 113 mg/dL — ABNORMAL HIGH (ref 74–99)
Potassium: 3.5 mmol/L (ref 3.5–5.0)
Sodium: 138 mmol/L (ref 132–146)
Total Bilirubin: 0.6 mg/dL (ref 0.0–1.2)
Total Protein: 7.6 g/dL (ref 6.4–8.3)

## 2019-06-29 LAB — STREP SCREEN GROUP A THROAT: Strep Grp A PCR: NEGATIVE

## 2019-06-29 MED ORDER — AMOXICILLIN 500 MG PO CAPS
500 MG | ORAL_CAPSULE | Freq: Three times a day (TID) | ORAL | 0 refills | Status: DC
Start: 2019-06-29 — End: 2019-07-07

## 2019-06-29 NOTE — ED Provider Notes (Addendum)
HPI:  06/29/19, Time: 4:53 AM EDT         Stephen Riley is a 54 y.o. male presenting to the ED for nasal drainage and loss of taste , beginning 1 day ago.  The complaint has been persistent, mild in severity, and worsened by nothing.  Patient presenting here because of sinus drainage and reporting dry mouth.  Patient also reports mild sore throat.  Patient reporting no actual fever.  Patient reporting some mild cough he does have a history of asthma though.  He states the cough is nonproductive.  He does report he was exposed to someone that has Covid.  Patient reporting no leg pain or swelling he reports no abdominal pain or vomiting or diarrhea.  Patient reporting no headache.    ROS:   Pertinent positives and negatives are stated within HPI, all other systems reviewed and are negative.  --------------------------------------------- PAST HISTORY ---------------------------------------------  Past Medical History:  has a past medical history of Asthma, Chronic back pain, Erectile dysfunction, GERD (gastroesophageal reflux disease), and Migraine.    Past Surgical History:  has a past surgical history that includes Orbital fracture repair (Left); Cardiac catheterization; Umbilical hernia repair; hernia repair; and Hand surgery (Right).    Social History:  reports that he has been smoking cigarettes. He started smoking about 33 years ago. He has been smoking about 0.50 packs per day. He has never used smokeless tobacco. He reports current drug use. Drug: Marijuana. He reports that he does not drink alcohol.    Family History: family history includes Asthma in his mother; Breast Cancer in his maternal aunt, maternal grandfather, maternal uncle, and maternal uncle; No Known Problems in his daughter, father, and son.     The patient???s home medications have been reviewed.    Allergies: Patient has no known allergies.    ---------------------------------------------------PHYSICAL  EXAM--------------------------------------    Constitutional/General: Alert and oriented x3, well appearing, non toxic in NAD  Head: Normocephalic and atraumatic  Eyes: PERRL, EOMI  Mouth: Oropharynx clear, handling secretions, no trismus  Neck: Supple, full ROM, non tender to palpation in the midline, no stridor, no crepitus, no meningeal signs  Pulmonary: Lungs clear to auscultation bilaterally, no wheezes, rales, or rhonchi. Not in respiratory distress  Cardiovascular:  Regular rate. Regular rhythm. No murmurs, gallops, or rubs. 2+ distal pulses  Chest: no chest wall tenderness  Abdomen: Soft.  Non tender. Non distended.  +BS.  No rebound, guarding, or rigidity. No pulsatile masses appreciated.  Musculoskeletal: Moves all extremities x 4. Warm and well perfused, no clubbing, cyanosis, or edema. Capillary refill <3 seconds  Skin: warm and dry. No rashes.   Neurologic: GCS 15, CN 2-12 grossly intact, no focal deficits, symmetric strength 5/5 in the upper and lower extremities bilaterally  Psych: Normal Affect    -------------------------------------------------- RESULTS -------------------------------------------------  I have personally reviewed all laboratory and imaging results for this patient. Results are listed below.     LABS:  Results for orders placed or performed during the hospital encounter of 06/29/19   COVID-19, Rapid   Result Value Ref Range    SARS-CoV-2, NAAT Not Detected Not Detected   CBC auto differential   Result Value Ref Range    WBC 7.3 4.5 - 11.5 E9/L    RBC 5.80 3.80 - 5.80 E12/L    Hemoglobin 13.4 12.5 - 16.5 g/dL    Hematocrit 70.6 23.7 - 54.0 %    MCV 71.9 (L) 80.0 - 99.9 fL  MCH 23.1 (L) 26.0 - 35.0 pg    MCHC 32.1 32.0 - 34.5 %    RDW 16.5 (H) 11.5 - 15.0 fL    Platelets 297 130 - 450 E9/L    MPV 11.1 7.0 - 12.0 fL    Neutrophils % 64.6 43.0 - 80.0 %    Immature Granulocytes % 0.1 0.0 - 5.0 %    Lymphocytes % 24.3 20.0 - 42.0 %    Monocytes % 8.7 2.0 - 12.0 %    Eosinophils % 1.9  0.0 - 6.0 %    Basophils % 0.4 0.0 - 2.0 %    Neutrophils Absolute 4.70 1.80 - 7.30 E9/L    Immature Granulocytes # 0.01 E9/L    Lymphocytes Absolute 1.77 1.50 - 4.00 E9/L    Monocytes Absolute 0.63 0.10 - 0.95 E9/L    Eosinophils Absolute 0.14 0.05 - 0.50 E9/L    Basophils Absolute 0.03 0.00 - 0.20 E9/L   Comprehensive Metabolic Panel   Result Value Ref Range    Sodium 138 132 - 146 mmol/L    Potassium 3.5 3.5 - 5.0 mmol/L    Chloride 103 98 - 107 mmol/L    CO2 23 22 - 29 mmol/L    Anion Gap 12 7 - 16 mmol/L    Glucose 113 (H) 74 - 99 mg/dL    BUN 18 6 - 20 mg/dL    CREATININE 0.9 0.7 - 1.2 mg/dL    GFR Non-African American >60 >=60 mL/min/1.73    GFR African American >60     Calcium 9.2 8.6 - 10.2 mg/dL    Total Protein 7.6 6.4 - 8.3 g/dL    Albumin 4.3 3.5 - 5.2 g/dL    Total Bilirubin 0.6 0.0 - 1.2 mg/dL    Alkaline Phosphatase 88 40 - 129 U/L    ALT 23 0 - 40 U/L    AST 30 0 - 39 U/L       RADIOLOGY:  Interpreted by Radiologist.  XR CHEST PORTABLE   Final Result   No acute process.                     ------------------------- NURSING NOTES AND VITALS REVIEWED ---------------------------   The nursing notes within the ED encounter and vital signs as below have been reviewed by myself.  BP 126/89    Pulse 98    Temp 97.3 ??F (36.3 ??C) (Temporal)    Resp 17    Ht 6\' 3"  (1.905 m)    Wt 225 lb (102.1 kg)    SpO2 96%    BMI 28.12 kg/m??   Oxygen Saturation Interpretation: Normal    The patient???s available past medical records and past encounters were reviewed.        ------------------------------ ED COURSE/MEDICAL DECISION MAKING----------------------  Medications - No data to display          Medical Decision Making:   Patient presenting here because of nasal drainage as well as sore throat and mild cough.  Patient reporting feeling congested.  Patient was exposed to someone with Covid.  Covid testing was negative.  Strep screen was ordered chest x-ray is negative labs are within normal limits.  Patient made aware  of need for follow-up and need to return if symptoms were to persist     Re-Evaluations:             Re-evaluation.  Patient???s symptoms show no change  Patient reevaluated no distress.  Patient  made aware of findings.  Patient Covid testing was negative.  Patient made aware of need for follow-up need to return if symptoms worsen or persist.    Consultations:                 Critical Care:         This patient's ED course included: a personal history and physicial eaxmination    This patient has been closely monitored during their ED course.    Counseling:   The emergency provider has spoken with the patient and discussed today???s results, in addition to providing specific details for the plan of care and counseling regarding the diagnosis and prognosis.  Questions are answered at this time and they are agreeable with the plan.       --------------------------------- IMPRESSION AND DISPOSITION ---------------------------------    IMPRESSION  1. Acute upper respiratory infection    2. Acute pharyngitis, unspecified etiology        DISPOSITION  Disposition: Discharge to home  Patient condition is stable        NOTE: This report was transcribed using voice recognition software. Every effort was made to ensure accuracy; however, inadvertent computerized transcription errors may be present          Dwain Sarna, MD  06/29/19 1610       Dwain Sarna, MD  06/29/19 0630

## 2019-06-29 NOTE — ED Notes (Signed)
Patient able to ambulate without difficulty at this time SpO2 was consistently 98% the entire time. Patient gait steady      Jimmye Norman, RN  06/29/19 [phone]

## 2019-06-29 NOTE — ED Notes (Signed)
This nurse called and spoke with lab about the covid test and she states that she is running it now      Jimmye Norman, RN  06/29/19 [phone]

## 2019-07-06 DIAGNOSIS — J189 Pneumonia, unspecified organism: Secondary | ICD-10-CM

## 2019-07-06 NOTE — ED Provider Notes (Signed)
ED Attending  CC: No  HPI:  07/07/19, Time: 1:23 AM EDT         Stephen Riley is a 54 y.o. male presenting to the ED for headache left arm numbness, beginning Monday morning .  The complaint has been persistent, severe in severity, and worsened by light, sounds .  Patient comes in with complaint of generalized headache.  She states that headache is throbbing in nature associated with sensitivity to light sound with blurring of vision and left arm numbness.  States the headache started yesterday morning.  Complains of neck pain.  Denies any fever chills.  States he has had sore throat over the last week and a half associated with sinus drainage and intermittent cough.  Patient denies any chest pain states he did have palpitations couple days ago denies any diaphoresis shortness of breath.  Patient denies any abdominal pain no lower extremity numbness tingling or weakness.  States he has a history of migraine headaches has had a headache similar to this in the past.  His headaches 1 at this intensity are associated with left arm numbness and blurred vision.    Review of Systems:   A complete review of systems was performed and pertinent positives and negatives are stated within HPI, all other systems reviewed and are negative.          --------------------------------------------- PAST HISTORY ---------------------------------------------  Past Medical History:  has a past medical history of Asthma, Chronic back pain, Erectile dysfunction, GERD (gastroesophageal reflux disease), and Migraine.    Past Surgical History:  has a past surgical history that includes Orbital fracture repair (Left); Cardiac catheterization; Umbilical hernia repair; hernia repair; and Hand surgery (Right).    Social History:  reports that he has been smoking cigarettes. He started smoking about 33 years ago. He has been smoking about 0.50 packs per day. He has never used smokeless tobacco. He reports current drug use. Drug: Marijuana. He  reports that he does not drink alcohol.    Family History: family history includes Asthma in his mother; Breast Cancer in his maternal aunt, maternal grandfather, maternal uncle, and maternal uncle; No Known Problems in his daughter, father, and son.     The patient???s home medications have been reviewed.    Allergies: Patient has no known allergies.    -------------------------------------------------- RESULTS -------------------------------------------------  All laboratory and radiology results have been personally reviewed by myself   LABS:  Results for orders placed or performed during the hospital encounter of 07/07/19   CBC Auto Differential   Result Value Ref Range    WBC 5.3 4.5 - 11.5 E9/L    RBC 5.55 3.80 - 5.80 E12/L    Hemoglobin 13.0 12.5 - 16.5 g/dL    Hematocrit 40.8 37.0 - 54.0 %    MCV 73.5 (L) 80.0 - 99.9 fL    MCH 23.4 (L) 26.0 - 35.0 pg    MCHC 31.9 (L) 32.0 - 34.5 %    RDW 16.7 (H) 11.5 - 15.0 fL    Platelets 263 130 - 450 E9/L    MPV 10.3 7.0 - 12.0 fL    Neutrophils % 58.7 43.0 - 80.0 %    Immature Granulocytes % 0.2 0.0 - 5.0 %    Lymphocytes % 30.1 20.0 - 42.0 %    Monocytes % 8.5 2.0 - 12.0 %    Eosinophils % 2.1 0.0 - 6.0 %    Basophils % 0.4 0.0 - 2.0 %    Neutrophils  Absolute 3.12 1.80 - 7.30 E9/L    Immature Granulocytes # 0.01 E9/L    Lymphocytes Absolute 1.60 1.50 - 4.00 E9/L    Monocytes Absolute 0.45 0.10 - 0.95 E9/L    Eosinophils Absolute 0.11 0.05 - 0.50 E9/L    Basophils Absolute 0.02 0.00 - 0.20 E9/L   Troponin   Result Value Ref Range    Troponin <0.01 0.00 - 0.03 ng/mL   Comprehensive Metabolic Panel   Result Value Ref Range    Sodium 139 132 - 146 mmol/L    Potassium 3.3 (L) 3.5 - 5.0 mmol/L    Chloride 102 98 - 107 mmol/L    CO2 25 22 - 29 mmol/L    Anion Gap 12 7 - 16 mmol/L    Glucose 169 (H) 74 - 99 mg/dL    BUN 17 6 - 20 mg/dL    CREATININE 1.0 0.7 - 1.2 mg/dL    GFR Non-African American >60 >=60 mL/min/1.73    GFR African American >60     Calcium 9.1 8.6 - 10.2 mg/dL     Total Protein 7.1 6.4 - 8.3 g/dL    Albumin 4.1 3.5 - 5.2 g/dL    Total Bilirubin 0.5 0.0 - 1.2 mg/dL    Alkaline Phosphatase 86 40 - 129 U/L    ALT 16 0 - 40 U/L    AST 19 0 - 39 U/L   Magnesium   Result Value Ref Range    Magnesium 2.0 1.6 - 2.6 mg/dL       RADIOLOGY:  Interpreted by Radiologist.  XR CHEST PORTABLE   Final Result   Patchy opacities in the lung bases may represent atelectasis.  Developing   infiltrates from pneumonia not excluded.  PA and lateral views would be   useful for further assessment, if symptoms persist.         CT HEAD WO CONTRAST   Final Result   No acute intracranial abnormality.             ------------------------- NURSING NOTES AND VITALS REVIEWED ---------------------------   The nursing notes within the ED encounter and vital signs as below have been reviewed.   BP 122/75    Pulse 73    Temp 93.2 ??F (34 ??C) (Temporal)    Resp 16    SpO2 97%   Oxygen Saturation Interpretation: Normal      ---------------------------------------------------PHYSICAL EXAM--------------------------------------      Constitutional/General: Alert and oriented x3, well appearing, non toxic in NAD  Head: Normocephalic and atraumatic  Eyes: PERRL, EOMI  Mouth: Oropharynx clear, handling secretions, no trismus  Neck: Supple, full ROM, no meningeal sign  Pulmonary: Lungs clear to auscultation bilaterally, no wheezes, rales, or rhonchi. Not in respiratory distress  Cardiovascular:  Regular rate and rhythm, no murmurs, gallops, or rubs. 2+ distal pulses  Abdomen: Soft, non tender, non distended,   Extremities: Moves all extremities x 4. Warm and well perfused normal strength sensation bilaterally  Skin: warm and dry without rash  Neurologic: GCS 15, no neurologic deficit  Psych: Normal Affect      ------------------------------ ED COURSE/MEDICAL DECISION MAKING----------------------  Medications   potassium chloride (KLOR-CON M) extended release tablet 40 mEq (has no administration in time range)   0.9 %  sodium chloride bolus (0 mLs Intravenous Stopped 07/07/19 0305)   ketorolac (TORADOL) injection 30 mg (30 mg Intravenous Given 07/07/19 0201)   metoclopramide (REGLAN) injection 10 mg (10 mg Intravenous Given 07/07/19 0201)   diphenhydrAMINE (BENADRYL)  injection 25 mg (25 mg Intravenous Given 07/07/19 0201)   cefTRIAXone (ROCEPHIN) 1,000 mg in sterile water 10 mL IV syringe (1,000 mg Intravenous New Bag 07/07/19 0302)   doxycycline hyclate (VIBRAMYCIN) capsule 100 mg (100 mg Oral Given 07/07/19 0302)         ED COURSE:    Nih score 0      0250 patient states that his headache has improved he just has mild headache at this time arm numbness is improving, but still present..  Patient was updated on chest x-ray plan for treatment for pneumonia.        Medical Decision Making:    Patient came in with complaint of generalized headache with left-sided arm numbness.  States he has a history of migraines with headache similar to the headache he presents with with history of left arm paresthesias with this migraine headache.  CT head was obtained no intracranial bleed or mass.  Headache improved with migraine cocktail paresthesia resolved.  Patient will be discharged home he was given referral to neurology to follow-up in 1 to 2 days.  He is return to the ER if he has any worsening symptoms.  Discharged with Fioricet for migraine headache.  He was noted to have bilateral.  Basilar opacities on chest x-ray patient has been symptomatic with cough over the last week and a half he was treated for pneumonia placed on Omnicef doxycycline.    Counseling:   The emergency provider has spoken with the patient and discussed today???s results, in addition to providing specific details for the plan of care and counseling regarding the diagnosis and prognosis.  Questions are answered at this time and they are agreeable with the plan.      --------------------------------- IMPRESSION AND DISPOSITION  ---------------------------------    IMPRESSION  1. Pneumonia due to organism    2. Nonintractable headache, unspecified chronicity pattern, unspecified headache type    3. Paresthesia        DISPOSITION  Disposition: Discharge to home  Patient condition is good      NOTE: This report was transcribed using voice recognition software. Every effort was made to ensure accuracy; however, inadvertent computerized transcription errors may be present     Griffin Basil, [place]  07/07/19 [phone]

## 2019-07-07 ENCOUNTER — Inpatient Hospital Stay
Admit: 2019-07-07 | Discharge: 2019-07-07 | Disposition: A | Discharge status: Home / Self Care | Payer: PRIVATE HEALTH INSURANCE | Attending: Emergency Medicine

## 2019-07-07 ENCOUNTER — Emergency Department: Admit: 2019-07-07 | Payer: PRIVATE HEALTH INSURANCE | Primary: Internal Medicine

## 2019-07-07 LAB — COMPREHENSIVE METABOLIC PANEL
ALT: 16 U/L (ref 0–40)
AST: 19 U/L (ref 0–39)
Albumin: 4.1 g/dL (ref 3.5–5.2)
Alkaline Phosphatase: 86 U/L (ref 40–129)
Anion Gap: 12 mmol/L (ref 7–16)
BUN: 17 mg/dL (ref 6–20)
CO2: 25 mmol/L (ref 22–29)
Calcium: 9.1 mg/dL (ref 8.6–10.2)
Chloride: 102 mmol/L (ref 98–107)
Creatinine: 1 mg/dL (ref 0.7–1.2)
GFR African American: >60
GFR Non-African American: >60 mL/min/{1.73_m2} (ref >=60)
Glucose: 169 mg/dL — ABNORMAL HIGH (ref 74–99)
Potassium: 3.3 mmol/L — ABNORMAL LOW (ref 3.5–5.0)
Sodium: 139 mmol/L (ref 132–146)
Total Bilirubin: 0.5 mg/dL (ref 0.0–1.2)
Total Protein: 7.1 g/dL (ref 6.4–8.3)

## 2019-07-07 LAB — CBC WITH AUTO DIFFERENTIAL
Basophils %: 0.4 % (ref 0.0–2.0)
Basophils Absolute: 0.02 E9/L (ref 0.00–0.20)
Eosinophils %: 2.1 % (ref 0.0–6.0)
Eosinophils Absolute: 0.11 E9/L (ref 0.05–0.50)
Hematocrit: 40.8 % (ref 37.0–54.0)
Hemoglobin: 13 g/dL (ref 12.5–16.5)
Immature Granulocytes #: 0.01 E9/L
Immature Granulocytes %: 0.2 % (ref 0.0–5.0)
Lymphocytes %: 30.1 % (ref 20.0–42.0)
Lymphocytes Absolute: 1.6 E9/L (ref 1.50–4.00)
MCH: 23.4 pg — ABNORMAL LOW (ref 26.0–35.0)
MCHC: 31.9 % — ABNORMAL LOW (ref 32.0–34.5)
MCV: 73.5 fL — ABNORMAL LOW (ref 80.0–99.9)
MPV: 10.3 fL (ref 7.0–12.0)
Monocytes %: 8.5 % (ref 2.0–12.0)
Monocytes Absolute: 0.45 E9/L (ref 0.10–0.95)
Neutrophils %: 58.7 % (ref 43.0–80.0)
Neutrophils Absolute: 3.12 E9/L (ref 1.80–7.30)
Platelets: 263 E9/L (ref 130–450)
RBC: 5.55 E12/L (ref 3.80–5.80)
RDW: 16.7 fL — ABNORMAL HIGH (ref 11.5–15.0)
WBC: 5.3 E9/L (ref 4.5–11.5)

## 2019-07-07 LAB — EKG 12-LEAD
Atrial Rate: 88 {beats}/min
P Axis: 68 degrees
P-R Interval: 168 ms
Q-T Interval: 374 ms
QRS Duration: 98 ms
QTc Calculation (Bazett): 452 ms
R Axis: 58 degrees
T Axis: 62 degrees
Ventricular Rate: 88 {beats}/min

## 2019-07-07 LAB — MAGNESIUM: Magnesium: 2 mg/dL (ref 1.6–2.6)

## 2019-07-07 LAB — TROPONIN: Troponin: <0.01 ng/mL (ref 0.00–0.03)

## 2019-07-07 MED ORDER — POTASSIUM CHLORIDE CRYS ER 20 MEQ PO TBCR
20 MEQ | Freq: Once | ORAL | Status: AC
Start: 2019-07-07 — End: 2019-07-07
  Administered 2019-07-07: 08:00:00 40 meq via ORAL

## 2019-07-07 MED ORDER — DOXYCYCLINE HYCLATE 100 MG PO TABS
100 MG | ORAL_TABLET | Freq: Two times a day (BID) | ORAL | 0 refills | Status: AC
Start: 2019-07-07 — End: 2019-07-17

## 2019-07-07 MED ORDER — DIPHENHYDRAMINE HCL 50 MG/ML IJ SOLN
50 MG/ML | Freq: Once | INTRAMUSCULAR | Status: AC
Start: 2019-07-07 — End: 2019-07-07
  Administered 2019-07-07: 06:00:00 25 mg via INTRAVENOUS

## 2019-07-07 MED ORDER — CEFTRIAXONE SODIUM 1 G IJ SOLR
1 g | Freq: Once | INTRAMUSCULAR | Status: AC
Start: 2019-07-07 — End: 2019-07-07
  Administered 2019-07-07: 07:00:00 1000 mg via INTRAVENOUS

## 2019-07-07 MED ORDER — SODIUM CHLORIDE 0.9 % IV BOLUS
0.9 % | Freq: Once | INTRAVENOUS | Status: AC
Start: 2019-07-07 — End: 2019-07-07
  Administered 2019-07-07: 06:00:00 1000 mL via INTRAVENOUS

## 2019-07-07 MED ORDER — CEFDINIR 300 MG PO CAPS
300 MG | ORAL_CAPSULE | Freq: Two times a day (BID) | ORAL | 0 refills | Status: AC
Start: 2019-07-07 — End: 2019-07-17

## 2019-07-07 MED ORDER — KETOROLAC TROMETHAMINE 30 MG/ML IJ SOLN
30 MG/ML | Freq: Once | INTRAMUSCULAR | Status: AC
Start: 2019-07-07 — End: 2019-07-07
  Administered 2019-07-07: 06:00:00 30 mg via INTRAVENOUS

## 2019-07-07 MED ORDER — BUTALBITAL-ASPIRIN-CAFFEINE 50-325-40 MG PO CAPS
50-325-40 MG | ORAL_CAPSULE | ORAL | 0 refills | Status: DC | PRN
Start: 2019-07-07 — End: 2019-07-12

## 2019-07-07 MED ORDER — DIPHENHYDRAMINE HCL 50 MG/ML IJ SOLN
50 MG/ML | Freq: Once | INTRAMUSCULAR | Status: DC
Start: 2019-07-07 — End: 2019-07-07

## 2019-07-07 MED ORDER — METOCLOPRAMIDE HCL 5 MG/ML IJ SOLN
5 MG/ML | Freq: Once | INTRAMUSCULAR | Status: AC
Start: 2019-07-07 — End: 2019-07-07
  Administered 2019-07-07: 06:00:00 10 mg via INTRAVENOUS

## 2019-07-07 MED ORDER — PROCHLORPERAZINE EDISYLATE 10 MG/2ML IJ SOLN
10 MG/2ML | Freq: Once | INTRAMUSCULAR | Status: DC
Start: 2019-07-07 — End: 2019-07-07

## 2019-07-07 MED ORDER — DOXYCYCLINE HYCLATE 100 MG PO CAPS
100 MG | Freq: Once | ORAL | Status: AC
Start: 2019-07-07 — End: 2019-07-07
  Administered 2019-07-07: 07:00:00 100 mg via ORAL

## 2019-07-07 MED FILL — KETOROLAC TROMETHAMINE 30 MG/ML IJ SOLN: 30 mg/mL | INTRAMUSCULAR | Qty: 1

## 2019-07-07 MED FILL — DIPHENHYDRAMINE HCL 50 MG/ML IJ SOLN: 50 mg/mL | INTRAMUSCULAR | Qty: 1

## 2019-07-07 MED FILL — DOXYCYCLINE HYCLATE 100 MG PO CAPS: 100 mg | ORAL | Qty: 1

## 2019-07-07 MED FILL — POTASSIUM CHLORIDE CRYS ER 20 MEQ PO TBCR: 20 meq | ORAL | Qty: 2

## 2019-07-07 MED FILL — CEFTRIAXONE SODIUM 1 G IJ SOLR: 1 g | INTRAMUSCULAR | Qty: 1000

## 2019-07-07 MED FILL — METOCLOPRAMIDE HCL 5 MG/ML IJ SOLN: 5 mg/mL | INTRAMUSCULAR | Qty: 2

## 2019-07-08 NOTE — Care Coordination-Inpatient (Signed)
Patient seen in [facility] ED on 07/07/19  Reason:Headache, Left arm numbness, Cough, Sore Throat, Sinus drainage    Per ED discharge instructons: Patient to follow up with Neurologist in 1-2 days and follow up with PCP; Fioricet every 4 hours prn headache and Omnicef 300 mg 1 tab 2 times daily for 10 days and Doxycycline 100 mg 1 tab 2 times a day for 10 days.   Encouraged Patient to obtain medications as soon as possible, as he was complaining of chest tightness. Denied dyspnea. Patient speaking in clear sentences. Did not notice any shortness of breath while he was speaking.     Patient explained that he was seen at hospital in [place] and has to look at discharge papers but thinks the Neurologist he was referred to, is also in [place]. Explained that he is taking necessary steps to get back to the [facility] in the next few weeks. However, he is agreeable to an ED follow up visit, virtual, audio only with Dr. Kyla Balzarine on Monday. Patient scheduled for 07/12/19 at 7 am with Dr. Kyla Balzarine.     Encouraged Patient to call office or return back to ED should his symptoms worsen or new symptoms occur. He voiced understanding.    Attempted to call Patient back (2 times) to verify that he is aware there are 2 different medications that he is to take for Pneumonia. Unfortunately, Patient answered, but call disconnected both times.    Will notify Dr. Kyla Balzarine of this update.

## 2019-07-08 NOTE — Care Coordination-Inpatient (Signed)
Thanks. I do NOT recommend taking Fioricet for headaches due to high risk for causing rebound headaches.

## 2019-07-08 NOTE — Care Coordination-Inpatient (Signed)
TC to Patient for ED follow up. Patient at work and asked that I call him back at 3:30 pm today after he is off work.    Will follow up with Patient at this requested time.

## 2019-07-08 NOTE — Care Coordination-Inpatient (Signed)
TC to Patient to notify him of the recommendation per Dr. Kyla Balzarine not to take Fioricet prn due to high risk for causing rebound headaches. Patient did not answer. Message left on his voicemail with the above information as well as explaining that there 2 different medications, Omnicef and Doxycycline were ordered by ED physician, that he is to take x 10 days. Encouraged Patient to call office back with any questions or concerns.

## 2019-07-12 ENCOUNTER — Telehealth
Admit: 2019-07-12 | Discharge: 2019-07-12 | Payer: PRIVATE HEALTH INSURANCE | Attending: Internal Medicine | Primary: Internal Medicine

## 2019-07-12 DIAGNOSIS — G43109 Migraine with aura, not intractable, without status migrainosus: Secondary | ICD-10-CM

## 2019-07-12 NOTE — Telephone Encounter (Signed)
Called to check in.  Left message on vm to return call to office

## 2019-07-12 NOTE — Progress Notes (Signed)
Subjective:     Patient: Stephen Riley is a 54 y.o. male    [name] presents for ED follow-up.     Went to the ED on 3/2 for dyspnea. CXR showed patchy opacities and he was discharged with scripts for prednisone and doxycycline.     Returned to the ED 3/16 for nasal congestion and drainage and cough after exposure to someone with COVID. CXR, rapid strep, and COVID PCR were unremarkable and he was discharged with a script for amoxicillin.     Again went to the ED on 3/24 for headaches and LUE numbness and tingling, stereotypical of previous complex migraines. Symptoms significantly improved following a headche cocktail. CXR again showed nonspecific bibasilar atelectasis vs infiltrates and he was discharged with scripts for doxy and cefdinir x10 days as well as Fioricet.     His dyspnea has now improved. Has had slight chills. No fevers. Now coughing of sputum. Muscle aches have improved. HAs have been much better. No further neurologic symptoms. Excedrin seems to work well for HAs most of the time. No longer taking Topamax.        Review of Systems   Constitutional: Positive for chills. Negative for fever.   Respiratory: Negative for cough and shortness of breath.    Musculoskeletal: Negative for myalgias.   Neurological: Negative for headaches.        No Known Allergies  Current Outpatient Medications on File Prior to Visit   Medication Sig Dispense Refill   ??? cefdinir (OMNICEF) 300 MG capsule Take 1 capsule by mouth 2 times daily for 10 days 20 capsule 0   ??? doxycycline hyclate (VIBRA-TABS) 100 MG tablet Take 1 tablet by mouth 2 times daily for 10 days 20 tablet 0   ??? albuterol sulfate HFA 108 (90 Base) MCG/ACT inhaler Inhale 2 puffs into the lungs every 6 hours as needed for Wheezing 1 Inhaler 1   ??? naproxen (NAPROSYN) 250 MG tablet Take 1-2 tablets by mouth 2 times daily as needed for Pain 60 tablet 3   ??? topiramate (TOPAMAX) 50 MG tablet Take 1 tablet by mouth 2 times daily 60 tablet 3   ??? sildenafil (VIAGRA)  100 MG tablet Take 1 tablet by mouth as needed for Erectile Dysfunction 10 tablet 3   ??? acetaminophen (AMINOFEN) 325 MG tablet Take 2 tablets by mouth every 6 hours as needed for Pain 24 tablet 3   ??? acetaminophen (TYLENOL) 325 MG tablet Take 650 mg by mouth every 6 hours as needed for Pain       No current facility-administered medications on file prior to visit.       Patient Active Problem List   Diagnosis   ??? GERD (gastroesophageal reflux disease)   ??? Erectile dysfunction   ??? Chronic back pain   ??? Asthma   ??? Migraine with aura      Social History     Tobacco Use   ??? Smoking status: Current Every Day Smoker     Packs/day: 0.50     Types: Cigarettes     Start date: 11/02/1985   ??? Smokeless tobacco: Never Used   Substance Use Topics   ??? Alcohol use: No      Family History   Problem Relation Age of Onset   ??? Asthma Mother    ??? No Known Problems Father    ??? No Known Problems Son    ??? No Known Problems Daughter    ??? Breast Cancer Maternal Aunt    ???  Breast Cancer Maternal Uncle         throat   ??? Breast Cancer Maternal Grandfather         pancreatic   ??? Breast Cancer Maternal Uncle          Objective:     There were no vitals taken for this visit.    Physical Exam    Data Reviewed and Summarized       Labs:     WBC 5.3  4.5 - 11.5 E9/L Final 07/07/2019 ??1:51 AM MH - [facility]   RBC 5.55  3.80 - 5.80 E12/L Final 07/07/2019 ??1:51 AM MH - [facility]   Hemoglobin 13.0  12.5 - 16.5 g/dL Final 64/68/0321 ??2:24 AM MH - [facility]   Hematocrit 40.8  37.0 - 54.0 % Final 07/07/2019 ??1:51 AM MH - [facility]   MCV 73.5Low   80.0 - 99.9 fL Final 07/07/2019 ??1:51 AM [facility] - [facility]   [facility] 23.4Low   26.0 - 35.0 pg Final 07/07/2019 ??1:51 AM MH - [facility]   MCHC 31.9Low   32.0 - 34.5 % Final 07/07/2019 ??1:51 AM MH - [facility]   RDW 16.7High   11.5 - 15.0 fL Final 07/07/2019 ??1:51 AM [facility]   Platelets 263  130 - 450 E9/L Final 07/07/2019 ??1:51 AM      Sodium 139  132 - 146 mmol/L Final 07/07/2019 ??1:51 AM MH - [facility]   Potassium 3.3Low   3.5 - 5.0 mmol/L Final 07/07/2019 ??1:51 AM MH - [facility]   Chloride 102  98 - 107 mmol/L Final 07/07/2019 ??1:51 AM MH - [facility]   CO2 25  22 - 29 mmol/L Final 07/07/2019 ??1:51 AM MH - [facility]   Anion Gap 12  7 - 16 mmol/L Final 07/07/2019 ??1:51 AM [facility] - [facility]   Glucose 169High   74 - 99 mg/dL Final 82/50/0370 ??4:88 AM MH - [facility]   BUN 17  6 - 20 mg/dL Final 89/16/9450 ??3:88 AM MH - [facility]   CREATININE 1.0  0.7 - 1.2 mg/dL Final 82/80/0349 ??1:79 AM MH - [facility]   GFR Non-African American >60  >=60 mL/min/1.73 Final 07/07/2019 ??1:51 AM MH - [facility]   Chronic Kidney Disease: less than 60 ml/min/1.73 sq.m.   ?? ?? ?? ?? Kidney Failure: less than 15 ml/min/1.73 sq.m.   Results valid for patients 18 years and older.    GFR African American >60   Final 07/07/2019 ??1:51 AM MH - [facility]   Calcium 9.1  8.6 - 10.2 mg/dL Final 15/08/6977 ??4:80 AM MH - [facility]   Total Protein 7.1  6.4 - 8.3 g/dL Final 16/55/3748 ??2:70 AM MH - [facility]   Albumin 4.1  3.5 - 5.2 g/dL Final 78/67/5449 ??2:01 AM MH - [facility]   Total Bilirubin 0.5  0.0 - 1.2 mg/dL Final 00/71/2197 ??5:88 AM MH - [facility]   Alkaline Phosphatase 86  40 - 129 U/L Final 07/07/2019 ??1:51 AM MH - [facility]   ALT 16  0 - 40 U/L Final 07/07/2019 ??1:51 AM MH - [facility]   AST 19  0 - 39 U/L Final 07/07/2019 ??1:51 AM        Imaging/Testing:     ONE XRAY VIEW OF THE CHEST   ??   07/07/2019 2:07 am   ??   COMPARISON:   06/29/2019   ??   HISTORY:   ORDERING SYSTEM PROVIDED HISTORY: dizziness,  numbness   TECHNOLOGIST PROVIDED HISTORY:   Reason for exam:->dizziness, numbness   What reading provider will be dictating this exam?->CRC   ??   FINDINGS:   Heart size is normal. ??No pneumothorax. ??There are emphysematous changes.   Patchy opacities in the lung bases are new. ??No effusion. ??Degenerative   changes are scattered in the spine.   ??   ??   Impression   Patchy opacities in the lung bases may represent atelectasis. ??Developing   infiltrates from pneumonia not excluded. ??PA and lateral views would be   useful for further assessment, if symptoms persist.             EXAMINATION:   CT OF THE HEAD WITHOUT CONTRAST ??07/06/2019 11:33 pm   ??   TECHNIQUE:   CT of the head was performed without the administration of intravenous   contrast. Dose modulation, iterative reconstruction, and/or weight based   adjustment of the mA/kV was utilized to reduce the radiation dose to as low   as reasonably achievable.   ??   COMPARISON:   None.   ??   HISTORY:   ORDERING SYSTEM PROVIDED HISTORY: headache   TECHNOLOGIST PROVIDED HISTORY:   Reason for exam:->headache   Has a "code stroke" or "stroke alert" been called?->No   Decision Support Exception->Emergency Medical Condition (MA)   What reading provider will be dictating this exam?->CRC   ??   FINDINGS:   BRAIN/VENTRICLES: There is no acute intracranial hemorrhage, mass effect or   midline shift. ??No abnormal extra-axial fluid collection. ??The gray-white   differentiation is maintained without evidence of an acute infarct. ??There is   no evidence of hydrocephalus. Focus of encephalomalacia right parietal lobe.   ??   ORBITS: The visualized portion of the orbits demonstrate no acute abnormality.   ??   SINUSES: The visualized paranasal sinuses and mastoid air cells demonstrate   no acute abnormality.   ??   SOFT TISSUES/SKULL: ??No acute abnormality of the visualized skull or soft   tissues.   ??   ??   Impression   No acute intracranial abnormality.         Assessment/Plan      1.  Migraine with aura and without status migrainosus, not intractable  Currently resolved.   I recommended starting the Topamax that's been prescribed. He plans to consider it.   OK to use PRN Excedrin but we did discuss the risks for rebound headaches. Do NOT use Fioricet. He's not.     2. Bilateral pulmonary infiltrates on CXR  Feeling better on cefdinir and doxy. On day three after a slight delay in starting abx. Apparently he was not compliant with prior scripts. I advised taking the abx for 7 days. The prescribed 10-day course is probably longer than is needed.   Consider repeat imaging at follow-up in May, especially considering smoking hx. Would recommend LDCT.     3. Mild persistent asthma with acute exacerbation  Disucssed adding a controlled inhaler and/or PFTs. He wants to hold off on both for now.     4. Colon cancer screening  Has an upcoming colonoscopy.     5.  High priority for COVID-19 virus vaccination  Advised vaccination. Recommended pharmacy or [facility].     Patient was seen today via Telehealth by agreement and consent in light of the current COVID-19 pandemic. I used the following Telehealth technology: Audio capability only. Total Length of call 30 minutes. The patient was offered and advised video for a more comprehensive evaluation, but the patient declined or was unable to use video. This patient encounter is appropriate and reasonable under the circumstances given the patient's particular presentation at this time. The patient has been advised of the potential risks and limitations of this mode of treatment (including but not limited to the absence of in-person examination) and has agreed to be treated in a remote fashion in spite of them. Any and all of the patient's/patient's family's questions on this issue have been answered and I have made no promises or guarantees to the patient. The patient has also been advised to contact this office for worsening conditions or problems, and  seek emergency medical treatment and/or call 911 if the patient deems either necessary. The patient stated that they are currently in the state of [place]. If the patient is a minor, permission has been obtained by the parent or guardian for the patient to receive medical care at this visit.        Arline Asp, DO  07/12/19  7:27 AM         Time personally spent assessing and managing the patient on the date of service: Est: 30-39 minutes ([id number])

## 2019-07-29 MED ORDER — PROCHLORPERAZINE EDISYLATE 10 MG/2ML IJ SOLN
10 | INTRAMUSCULAR | Status: DC | PRN
Start: 2019-07-29 — End: 2019-07-29

## 2019-07-29 MED ORDER — ACETAMINOPHEN 325 MG PO TABS
325 | ORAL | Status: DC | PRN
Start: 2019-07-29 — End: 2019-07-29

## 2019-07-29 MED ORDER — GENERIC EXTERNAL MEDICATION
Status: DC | PRN
Start: 2019-07-29 — End: 2019-07-29

## 2019-08-01 ENCOUNTER — Emergency Department: Admit: 2019-08-01 | Payer: PRIVATE HEALTH INSURANCE | Primary: Internal Medicine

## 2019-08-01 ENCOUNTER — Inpatient Hospital Stay
Admit: 2019-08-01 | Discharge: 2019-08-01 | Disposition: A | Discharge status: Home / Self Care | Payer: PRIVATE HEALTH INSURANCE | Attending: Emergency Medicine

## 2019-08-01 DIAGNOSIS — R0789 Other chest pain: Secondary | ICD-10-CM

## 2019-08-01 LAB — CBC WITH AUTO DIFFERENTIAL
Basophils %: 0.7 % (ref 0.0–2.0)
Basophils Absolute: 0.04 E9/L (ref 0.00–0.20)
Eosinophils %: 2.7 % (ref 0.0–6.0)
Eosinophils Absolute: 0.15 E9/L (ref 0.05–0.50)
Hematocrit: 44.6 % (ref 37.0–54.0)
Hemoglobin: 14.1 g/dL (ref 12.5–16.5)
Immature Granulocytes #: 0.01 E9/L
Immature Granulocytes %: 0.2 % (ref 0.0–5.0)
Lymphocytes %: 35.7 % (ref 20.0–42.0)
Lymphocytes Absolute: 1.98 E9/L (ref 1.50–4.00)
MCH: 23.3 pg — ABNORMAL LOW (ref 26.0–35.0)
MCHC: 31.6 % — ABNORMAL LOW (ref 32.0–34.5)
MCV: 73.8 fL — ABNORMAL LOW (ref 80.0–99.9)
MPV: 10.4 fL (ref 7.0–12.0)
Monocytes %: 8.3 % (ref 2.0–12.0)
Monocytes Absolute: 0.46 E9/L (ref 0.10–0.95)
Neutrophils %: 52.4 % (ref 43.0–80.0)
Neutrophils Absolute: 2.9 E9/L (ref 1.80–7.30)
Platelets: 327 E9/L (ref 130–450)
RBC: 6.04 E12/L — ABNORMAL HIGH (ref 3.80–5.80)
RDW: 17.9 fL — ABNORMAL HIGH (ref 11.5–15.0)
WBC: 5.5 E9/L (ref 4.5–11.5)

## 2019-08-01 LAB — COMPREHENSIVE METABOLIC PANEL W/ REFLEX TO MG FOR LOW K
ALT: 30 U/L (ref 0–40)
AST: 33 U/L (ref 0–39)
Albumin: 4.6 g/dL (ref 3.5–5.2)
Alkaline Phosphatase: 99 U/L (ref 40–129)
Anion Gap: 13 mmol/L (ref 7–16)
BUN: 14 mg/dL (ref 6–20)
CO2: 26 mmol/L (ref 22–29)
Calcium: 9.3 mg/dL (ref 8.6–10.2)
Chloride: 99 mmol/L (ref 98–107)
Creatinine: 0.9 mg/dL (ref 0.7–1.2)
GFR African American: >60
GFR Non-African American: >60 mL/min/{1.73_m2} (ref >=60)
Glucose: 93 mg/dL (ref 74–99)
Potassium reflex Magnesium: 3.7 mmol/L (ref 3.5–5.0)
Sodium: 138 mmol/L (ref 132–146)
Total Bilirubin: 0.4 mg/dL (ref 0.0–1.2)
Total Protein: 8.4 g/dL — ABNORMAL HIGH (ref 6.4–8.3)

## 2019-08-01 LAB — EKG 12-LEAD
Atrial Rate: 81 {beats}/min
P Axis: 71 degrees
P-R Interval: 168 ms
Q-T Interval: 400 ms
QRS Duration: 96 ms
QTc Calculation (Bazett): 464 ms
R Axis: 57 degrees
T Axis: 57 degrees
Ventricular Rate: 81 {beats}/min

## 2019-08-01 LAB — TROPONIN
Troponin: <0.01 ng/mL (ref 0.00–0.03)
Troponin: <0.01 ng/mL (ref 0.00–0.03)

## 2019-08-01 LAB — COVID-19, RAPID: SARS-CoV-2, NAAT: NOT DETECTED

## 2019-08-01 LAB — D-DIMER, QUANTITATIVE: D-Dimer, Quant: 484 ng/mL DDU

## 2019-08-01 MED ORDER — DIPHENHYDRAMINE HCL 50 MG/ML IJ SOLN
50 MG/ML | Freq: Once | INTRAMUSCULAR | Status: AC
Start: 2019-08-01 — End: 2019-08-01
  Administered 2019-08-01: 12:00:00 25 mg via INTRAVENOUS

## 2019-08-01 MED ORDER — FAMOTIDINE 20 MG/2ML IV SOLN
20 MG/2ML | Freq: Once | INTRAVENOUS | Status: AC
Start: 2019-08-01 — End: 2019-08-01
  Administered 2019-08-01: 12:00:00 20 mg via INTRAVENOUS

## 2019-08-01 MED ORDER — NORMAL SALINE FLUSH 0.9 % IV SOLN
0.9 % | Freq: Once | INTRAVENOUS | Status: AC
Start: 2019-08-01 — End: 2019-08-01
  Administered 2019-08-01: 13:00:00 10 mL via INTRAVENOUS

## 2019-08-01 MED ORDER — IOPAMIDOL 76 % IV SOLN
76 % | Freq: Once | INTRAVENOUS | Status: AC | PRN
Start: 2019-08-01 — End: 2019-08-01
  Administered 2019-08-01: 13:00:00 70 mL via INTRAVENOUS

## 2019-08-01 MED ORDER — KETOROLAC TROMETHAMINE 30 MG/ML IJ SOLN
30 MG/ML | Freq: Once | INTRAMUSCULAR | Status: AC
Start: 2019-08-01 — End: 2019-08-01
  Administered 2019-08-01: 11:00:00 15 mg via INTRAVENOUS

## 2019-08-01 MED ORDER — METHYLPREDNISOLONE SODIUM SUCC 40 MG IJ SOLR
40 MG | Freq: Once | INTRAMUSCULAR | Status: AC
Start: 2019-08-01 — End: 2019-08-01
  Administered 2019-08-01: 12:00:00 40 mg via INTRAVENOUS

## 2019-08-01 MED FILL — KETOROLAC TROMETHAMINE 30 MG/ML IJ SOLN: 30 mg/mL | INTRAMUSCULAR | Qty: 1

## 2019-08-01 MED FILL — FAMOTIDINE 20 MG/2ML IV SOLN: 20 MG/2ML | INTRAVENOUS | Qty: 2

## 2019-08-01 MED FILL — SOLU-MEDROL 40 MG IJ SOLR: 40 mg | INTRAMUSCULAR | Qty: 40

## 2019-08-01 MED FILL — DIPHENHYDRAMINE HCL 50 MG/ML IJ SOLN: 50 mg/mL | INTRAMUSCULAR | Qty: 1

## 2019-08-01 NOTE — Discharge Instructions (Signed)
Return to emergency department with worsening chest pain or shortness of breath.

## 2019-08-01 NOTE — ED Notes (Signed)
EKG delayed.  Pt reports that bed linens look soiled.  This nurse to strip bed of previous linen, clean bed with clorox wipes and allow appropriate drying time before replacing linen     Bonnielee Haff, RN  08/01/19 [phone]

## 2019-08-01 NOTE — ED Provider Notes (Signed)
HPI   54 year old male patient presented to emergency department with complaint of chest pain.  Patient recently seen at [facility] discharged hospital 3 days ago for strokelike symptoms.  Patient says he has been getting chest pain for many years and has had negative stress test and cardiac catheterization in the past.  Patient says that yesterday around 11 PM he developed chest pressure sensation across his chest with associated numbness in his left hand shortness of breath.  Chest pain is nonradiating and not better or worse with exertion or rest.  He also mentions he has had a nonproductive cough for the last week.  Says he was unable to fall asleep tonight due to chest pain.  He notes a headache as well however he says it feels like his usual migraine headache and is not concerned by it.  Review of Systems   Constitutional: Negative for chills and fever.   HENT: Negative for congestion.    Respiratory: Positive for cough and shortness of breath.    Gastrointestinal: Negative for abdominal pain, diarrhea, nausea and vomiting.   All other systems reviewed and are negative.  Cardiovascular. Positive for chest pain. Negative for leg swelling  Eyes: negative for visual disturbance  Neck: negative for stiffness or neck pain  GU: negative for difficulty urinating  Neurological: negative for numbness, weakness, syncope  Skin: negative for color change and rash    Physical Exam  Vitals signs and nursing note reviewed.   Constitutional:       Appearance: He is well-developed.   HENT:      Head: Normocephalic and atraumatic.   Eyes:      Conjunctiva/sclera: Conjunctivae normal.   Neck:      Musculoskeletal: Normal range of motion and neck supple.   Cardiovascular:      Rate and Rhythm: Normal rate and regular rhythm.      Heart sounds: Normal heart sounds. No murmur.   Pulmonary:      Effort: Pulmonary effort is normal. No respiratory distress.      Breath sounds: Normal breath sounds. No wheezing or rales.    Abdominal:      General: Bowel sounds are normal.      Palpations: Abdomen is soft.      Tenderness: There is no abdominal tenderness. There is no guarding or rebound.   Musculoskeletal:         General: No tenderness or deformity.   Skin:     General: Skin is warm and dry.   Neurological:      Mental Status: He is alert and oriented to person, place, and time.      Cranial Nerves: No cranial nerve deficit.      Coordination: Coordination normal.          Procedures     MDM   54 year old male patient presents to emergency department for chief complaint of chest pain.  Patient's vitals stable and examination unremarkable.  Lab work here showing negative troponin however he does have a positive D-dimer.  CTA of his chest was negative for pulmonary embolism and delta troponin negative as well.  We will give patient Toradol here in the emergency department and his pain improved.  Patient will follow up with cardiologist.  Return precautions provided.  Vitals are stable.  COVID also negative.  EKG:  This EKG is signed and interpreted by me.    Rate: 81  Rhythm: Sinus  Interpretation: no acute changes  Comparison: stable as compared to  patient's most recent EKG      ED Course as of Aug 01 1227   Sun Aug 01, 2019   1220 Patient symptoms improved after Toradol.  Delta troponin negative.      [FG]      ED Course User Index  [FG] Aggie Moats, DO          ED Course as of Jul 31 1228   Sun Aug 01, 2019   1220 Patient symptoms improved after Toradol.  Delta troponin negative.      [FG]      ED Course User Index  [FG] Aggie Moats, DO       --------------------------------------------- PAST HISTORY ---------------------------------------------  Past Medical History:  has a past medical history of Asthma, Chronic back pain, Erectile dysfunction, GERD (gastroesophageal reflux disease), and Migraine.    Past Surgical History:  has a past surgical history that includes Orbital fracture repair (Left); Cardiac catheterization;  Umbilical hernia repair; hernia repair; and Hand surgery (Right).    Social History:  reports that he has been smoking cigarettes. He started smoking about 33 years ago. He has been smoking about 0.50 packs per day. He has never used smokeless tobacco. He reports current drug use. Drug: Marijuana. He reports that he does not drink alcohol.    Family History: family history includes Asthma in his mother; Breast Cancer in his maternal aunt, maternal grandfather, maternal uncle, and maternal uncle; No Known Problems in his daughter, father, and son.     The patient???s home medications have been reviewed.    Allergies: Iodine    -------------------------------------------------- RESULTS -------------------------------------------------  Labs:  Results for orders placed or performed during the hospital encounter of 08/01/19   COVID-19, Rapid    Specimen: Nasopharyngeal Swab   Result Value Ref Range    SARS-CoV-2, NAAT Not Detected Not Detected   CBC Auto Differential   Result Value Ref Range    WBC 5.5 4.5 - 11.5 E9/L    RBC 6.04 (H) 3.80 - 5.80 E12/L    Hemoglobin 14.1 12.5 - 16.5 g/dL    Hematocrit 87.8 67.6 - 54.0 %    MCV 73.8 (L) 80.0 - 99.9 fL    MCH 23.3 (L) 26.0 - 35.0 pg    MCHC 31.6 (L) 32.0 - 34.5 %    RDW 17.9 (H) 11.5 - 15.0 fL    Platelets 327 130 - 450 E9/L    MPV 10.4 7.0 - 12.0 fL    Neutrophils % 52.4 43.0 - 80.0 %    Immature Granulocytes % 0.2 0.0 - 5.0 %    Lymphocytes % 35.7 20.0 - 42.0 %    Monocytes % 8.3 2.0 - 12.0 %    Eosinophils % 2.7 0.0 - 6.0 %    Basophils % 0.7 0.0 - 2.0 %    Neutrophils Absolute 2.90 1.80 - 7.30 E9/L    Immature Granulocytes # 0.01 E9/L    Lymphocytes Absolute 1.98 1.50 - 4.00 E9/L    Monocytes Absolute 0.46 0.10 - 0.95 E9/L    Eosinophils Absolute 0.15 0.05 - 0.50 E9/L    Basophils Absolute 0.04 0.00 - 0.20 E9/L   Comprehensive Metabolic Panel w/ Reflex to MG   Result Value Ref Range    Sodium 138 132 - 146 mmol/L    Potassium reflex Magnesium 3.7 3.5 - 5.0 mmol/L     Chloride 99 98 - 107 mmol/L    CO2 26 22 - 29 mmol/L    Anion  Gap 13 7 - 16 mmol/L    Glucose 93 74 - 99 mg/dL    BUN 14 6 - 20 mg/dL    CREATININE 0.9 0.7 - 1.2 mg/dL    GFR Non-African American >60 >=60 mL/min/1.73    GFR African American >60     Calcium 9.3 8.6 - 10.2 mg/dL    Total Protein 8.4 (H) 6.4 - 8.3 g/dL    Albumin 4.6 3.5 - 5.2 g/dL    Total Bilirubin 0.4 0.0 - 1.2 mg/dL    Alkaline Phosphatase 99 40 - 129 U/L    ALT 30 0 - 40 U/L    AST 33 0 - 39 U/L   Troponin   Result Value Ref Range    Troponin <0.01 0.00 - 0.03 ng/mL   D-Dimer, Quantitative   Result Value Ref Range    D-Dimer, Quant 484 ng/mL DDU   Troponin   Result Value Ref Range    Troponin <0.01 0.00 - 0.03 ng/mL   EKG 12 Lead   Result Value Ref Range    Ventricular Rate 81 BPM    Atrial Rate 81 BPM    P-R Interval 168 ms    QRS Duration 96 ms    Q-T Interval 400 ms    QTc Calculation (Bazett) 464 ms    P Axis 71 degrees    R Axis 57 degrees    T Axis 57 degrees       Radiology:  CTA PULMONARY W CONTRAST   Final Result   No evidence of pulmonary embolism      Ground-glass opacity in right upper lobe may represent   infectious/inflammatory process.  Follow-up CT may be obtained in 6-8 weeks   to document resolution.         XR CHEST PORTABLE   Final Result   No acute process.             ------------------------- NURSING NOTES AND VITALS REVIEWED ---------------------------  Date / Time Roomed:  08/01/2019  5:22 AM  ED Bed Assignment:  13/13    The nursing notes within the ED encounter and vital signs as below have been reviewed.   BP (!) 135/96    Pulse 66    Temp 97.1 ??F (36.2 ??C)    Resp 19    Ht 6\' 3"  (1.905 m)    Wt 225 lb (102.1 kg)    SpO2 95%    BMI 28.12 kg/m??   Oxygen Saturation Interpretation: Normal      ------------------------------------------ PROGRESS NOTES ------------------------------------------  12:30 PM EDT  I have spoken with the patient and discussed today???s results, in addition to providing specific details for the plan  of care and counseling regarding the diagnosis and prognosis.  Their questions are answered at this time and they are agreeable with the plan. I discussed at length with them reasons for immediate return here for re evaluation. They will followup with their primary care physician by calling their office on Monday.      --------------------------------- ADDITIONAL PROVIDER NOTES ---------------------------------  At this time the patient is without objective evidence of an acute process requiring hospitalization or inpatient management.  They have remained hemodynamically stable throughout their entire ED visit and are stable for discharge with outpatient follow-up.     The plan has been discussed in detail and they are aware of the specific conditions for emergent return, as well as the importance of follow-up.      New Prescriptions  No medications on file       Diagnosis:  1. Chest pain, unspecified type        Disposition:  Patient's disposition: Discharge to home  Patient's condition is stable.         Aggie Moats, DO  Resident  08/01/19 1231       Clerance Lav, MD  08/02/19 [phone]

## 2019-08-04 ENCOUNTER — Emergency Department: Admit: 2019-08-04 | Payer: PRIVATE HEALTH INSURANCE | Primary: Internal Medicine

## 2019-08-04 ENCOUNTER — Inpatient Hospital Stay
Admit: 2019-08-04 | Discharge: 2019-08-04 | Disposition: A | Discharge status: Home / Self Care | Payer: PRIVATE HEALTH INSURANCE

## 2019-08-04 DIAGNOSIS — J4520 Mild intermittent asthma, uncomplicated: Secondary | ICD-10-CM

## 2019-08-04 MED ORDER — IPRATROPIUM-ALBUTEROL 0.5-2.5 (3) MG/3ML IN SOLN
Freq: Once | RESPIRATORY_TRACT | Status: AC
Start: 2019-08-04 — End: 2019-08-04
  Administered 2019-08-04: 18:00:00 1 via RESPIRATORY_TRACT

## 2019-08-04 MED ORDER — ALBUTEROL SULFATE HFA 108 (90 BASE) MCG/ACT IN AERS
108 (90 Base) MCG/ACT | Freq: Four times a day (QID) | RESPIRATORY_TRACT | 1 refills | Status: DC | PRN
Start: 2019-08-04 — End: 2019-10-26

## 2019-08-04 MED FILL — IPRATROPIUM-ALBUTEROL 0.5-2.5 (3) MG/3ML IN SOLN: 0.5-2.5 (3) MG/3ML | RESPIRATORY_TRACT | Qty: 3

## 2019-08-04 NOTE — ED Provider Notes (Signed)
[facility]    HPI:  08/04/19, Time: 1:12 PM EDT         Stephen Riley is a 54 y.o. male presenting to the ED for cough, congestion, beginning 1 week ago.  The complaint has been intermittent, mild in severity, and worsened by deep breath.  Patient reports that he usually has an albuterol inhaler to help with his symptoms however he ran out and is requesting a refill at this time.  Denies any chest pain or shortness of breath.  He has been afebrile without recent travel or sick contacts.  Cough is nonproductive.  Patient denies all other symptoms at this time.    Review of Systems:   A complete review of systems was performed and pertinent positives and negatives are stated within HPI, all other systems reviewed and are negative.          --------------------------------------------- PAST HISTORY ---------------------------------------------  Past Medical History:  has a past medical history of Asthma, Chronic back pain, Erectile dysfunction, GERD (gastroesophageal reflux disease), and Migraine.    Past Surgical History:  has a past surgical history that includes Orbital fracture repair (Left); Cardiac catheterization; Umbilical hernia repair; hernia repair; and Hand surgery (Right).    Social History:  reports that he has been smoking cigarettes. He started smoking about 33 years ago. He has been smoking about 0.50 packs per day. He has never used smokeless tobacco. He reports current drug use. Drug: Marijuana. He reports that he does not drink alcohol.    Family History: family history includes Asthma in his mother; Breast Cancer in his maternal aunt, maternal grandfather, maternal uncle, and maternal uncle; No Known Problems in his daughter, father, and son.     The patient???s home medications have been reviewed.    Allergies: Iodine    -------------------------------------------------- RESULTS -------------------------------------------------  All laboratory and radiology results have been personally  reviewed by myself   LABS:  No results found for this visit on 08/04/19.    RADIOLOGY:  Interpreted by Radiologist.  XR CHEST (2 VW)   Final Result   No acute process.             ------------------------- NURSING NOTES AND VITALS REVIEWED ---------------------------   The nursing notes within the ED encounter and vital signs as below have been reviewed.   BP 122/86    Pulse 84    Temp 98.1 ??F (36.7 ??C)    Resp 18    SpO2 99%   Oxygen Saturation Interpretation: Normal      ---------------------------------------------------PHYSICAL EXAM--------------------------------------      Constitutional/General: Alert and oriented x3, well appearing, non toxic in NAD  Head: Normocephalic and atraumatic  Eyes: PERRL, EOMI  Mouth: Oropharynx clear, handling secretions, no trismus  Neck: Supple, full ROM,   Pulmonary: Mild expiratory wheezes in the lower lung fields, no rales or rhonchi.  Not in respiratory distress  Cardiovascular:  Regular rate and rhythm, no murmurs, gallops, or rubs. 2+ distal pulses  Abdomen: Soft, non tender, non distended,   Extremities: Moves all extremities x 4. Warm and well perfused  Skin: warm and dry without rash  Neurologic: GCS 15,  Psych: Normal Affect      ------------------------------ ED COURSE/MEDICAL DECISION MAKING----------------------  Medications   ipratropium-albuterol (DUONEB) nebulizer solution 1 ampule (1 ampule Inhalation Given 08/04/19 1345)       Medical Decision Making:        ED COURSE:  ED Course as of Aug 03 1820   Wed  Aug 04, 2019   1504 Reassessed patient.  Remained stable.  Breath sounds improved after breathing treatment.  Discussed x-ray results with the patient.  No acute pathology noted on imaging studies today.  We will refill his inhaler and recommend follow-up with PCP for recheck.  He is to return to the ED with any new or worsening symptoms.  Patient voiced understanding scribbled to the above treatment plan.    [MS]      ED Course User Index  [MS] Pricilla Handler, [place]          Counseling:   The emergency provider has spoken with the patient and discussed today???s results, in addition to providing specific details for the plan of care and counseling regarding the diagnosis and prognosis.  Questions are answered at this time and they are agreeable with the plan.      --------------------------------- IMPRESSION AND DISPOSITION ---------------------------------    IMPRESSION  1. Cough    2. Encounter for medication refill    3. Mild intermittent asthma without complication        DISPOSITION  Disposition: Discharge to home  Patient condition is stable      NOTE: This report was transcribed using voice recognition software. Every effort was made to ensure accuracy; however, inadvertent computerized transcription errors may be present        Pricilla Handler, [place]  08/04/19 1822

## 2019-08-04 NOTE — ED Notes (Signed)
Resp called regarding breathing treatments     Jason Fila, RN  08/04/19 1327

## 2019-08-04 NOTE — Discharge Instructions (Signed)
Please return to the ED with new or worsening symptoms. Follow up with PCP for recheck.

## 2019-08-06 NOTE — Telephone Encounter (Signed)
Patient went to [facility] in [place] for cough, congestion on 08/04/19, 08/01/19 for chest pain, and admitted to [facility] for left arm numbness.     Spoke with Patient to determine if symptoms have resolved and to schedule follow up with Dr. Kyla Balzarine. Patient denied need for any medication refills, stating that he is not taking any scheduled medications, just using prn albuterol or over the counter medications, such as tylenol prn.    Patient reported that he is feeling better. Denied having any symptoms that took him to ED x 3 as listed above. Stated that he is scheduled to see a Neurologist in the upcoming weeks, as well as Cardiology on 08/17/19. Patient has follow up appointment scheduled with Dr.Carmichael on 09/06/19. Patient scheduled for follow up on 08/20/19 with Dr. Kyla Balzarine for follow up to the ED visit and hospital admission as listed above.

## 2019-08-17 ENCOUNTER — Encounter: Payer: PRIVATE HEALTH INSURANCE | Attending: Interventional Cardiology | Primary: Internal Medicine

## 2019-08-18 ENCOUNTER — Inpatient Hospital Stay
Admit: 2019-08-18 | Discharge: 2019-08-18 | Disposition: A | Discharge status: Home / Self Care | Payer: PRIVATE HEALTH INSURANCE

## 2019-08-18 DIAGNOSIS — Z20822 Contact with and (suspected) exposure to covid-19: Secondary | ICD-10-CM

## 2019-08-18 LAB — COVID-19, RAPID: SARS-CoV-2, NAAT: NOT DETECTED

## 2019-08-18 NOTE — ED Provider Notes (Signed)
Independent [facility]        Department of Emergency Medicine   ED  Provider Note  Admit Date/RoomTime: 08/18/2019  3:34 PM  ED Room: INTAKE04/INT-04  HPI:  08/18/19, Time: 4:18 PM EDT      The patient is a 54 year old male presenting to emergency department requesting COVID-19 testing.  Patient states he has had a close exposure over the last 3 to 4 days.  He states the person just tested positive today.  He has not had any symptoms.  Patient denies any chest pain, shortness of breath, cough, fevers or chills, loss of taste or smell, abdominal pain.    The history is provided by the patient. No language interpreter was used.           REVIEW OF SYSTEMS:  Review of Systems   Constitutional: Negative for activity change, chills, fatigue and fever.   Respiratory: Negative for cough, chest tightness and shortness of breath.    Cardiovascular: Negative for chest pain, palpitations and leg swelling.   Gastrointestinal: Negative for abdominal pain, constipation, diarrhea, nausea and vomiting.   Genitourinary: Negative for dysuria, flank pain, frequency and hematuria.   Musculoskeletal: Negative for back pain, neck pain and neck stiffness.   Skin: Negative for color change, pallor and rash.   Neurological: Negative for dizziness, light-headedness and headaches.   Psychiatric/Behavioral: Negative for agitation, behavioral problems and confusion.      Pertinent positives and negatives are stated within HPI, all other systems reviewed and are negative.      --------------------------------------------- PAST HISTORY ---------------------------------------------  Past Medical History:  has a past medical history of Asthma, Chronic back pain, Erectile dysfunction, GERD (gastroesophageal reflux disease), and Migraine.    Past Surgical History:  has a past surgical history that includes Orbital fracture repair (Left); Cardiac catheterization; Umbilical hernia repair; hernia repair; and Hand surgery (Right).    Social History:  reports that  he has been smoking cigarettes. He started smoking about 33 years ago. He has been smoking about 0.50 packs per day. He has never used smokeless tobacco. He reports current drug use. Drug: Marijuana. He reports that he does not drink alcohol.    Family History: family history includes Asthma in his mother; Breast Cancer in his maternal aunt, maternal grandfather, maternal uncle, and maternal uncle; No Known Problems in his daughter, father, and son.     The patient???s home medications have been reviewed.    Allergies: Contrast [iodides] and Iodine    -------------------------------------------------- RESULTS -------------------------------------------------  All laboratory and radiology results have been personally reviewed by myself   LABS:  Results for orders placed or performed during the hospital encounter of 08/18/19   COVID-19, Rapid    Specimen: Nasopharyngeal Swab   Result Value Ref Range    SARS-CoV-2, NAAT Not Detected Not Detected       RADIOLOGY:  Interpreted by Radiologist.  No orders to display       ------------------------- NURSING NOTES AND VITALS REVIEWED ---------------------------   The nursing notes within the ED encounter and vital signs as below have been reviewed.   BP (!) 166/81    Pulse 74    Temp 97 ??F (36.1 ??C)    Resp 18    Ht 6\' 3"  (1.905 m)    Wt 220 lb (99.8 kg)    SpO2 98%    BMI 27.50 kg/m??   Oxygen Saturation Interpretation: Normal      ---------------------------------------------------PHYSICAL EXAM--------------------------------------    Physical Exam  Vitals signs  and nursing note reviewed.   Constitutional:       General: He is not in acute distress.     Appearance: Normal appearance. He is well-developed. He is not ill-appearing.   HENT:      Head: Normocephalic and atraumatic.      Mouth/Throat:      Mouth: Mucous membranes are moist.      Pharynx: Oropharynx is clear.   Neck:      Musculoskeletal: Normal range of motion and neck supple. No neck rigidity.   Cardiovascular:       Rate and Rhythm: Normal rate and regular rhythm.      Heart sounds: Normal heart sounds. No murmur.   Pulmonary:      Effort: Pulmonary effort is normal. No respiratory distress.      Breath sounds: Normal breath sounds.   Musculoskeletal: Normal range of motion.         General: No swelling, tenderness or deformity.   Skin:     General: Skin is warm and dry.      Capillary Refill: Capillary refill takes less than 2 seconds.      Findings: No erythema.   Neurological:      General: No focal deficit present.      Mental Status: He is alert and oriented to person, place, and time. Mental status is at baseline.      Coordination: Coordination normal.   Psychiatric:         Mood and Affect: Mood normal.         Behavior: Behavior normal.         Thought Content: Thought content normal.            ------------------------------ ED COURSE/MEDICAL DECISION MAKING----------------------  Medications - No data to display      ED COURSE:      No orders to display         Procedures:  Procedures     Medical Decision Making:   MDM   54 year old male presenting to ED for COVID-19 testing.  He is asymptomatic but had a close exposure.  Covid test is negative.  Patient is educated on quarantine and supportive measures.  He is discharged home in good condition.      Counseling:   The emergency provider has spoken with the patient and discussed today???s results, in addition to providing specific details for the plan of care and counseling regarding the diagnosis and prognosis.  Questions are answered at this time and they are agreeable with the plan.      --------------------------------- IMPRESSION AND DISPOSITION ---------------------------------    IMPRESSION  1. Close exposure to COVID-19 virus        DISPOSITION  Disposition: Discharge to home  Patient condition is good      Electronically signed by Cecilie Lowers, PA-C   DD: 08/18/19  **This report was transcribed using voice recognition software. Every effort was made to ensure  accuracy; however, inadvertent computerized transcription errors may be present.  END OF ED PROVIDER NOTE         Cecilie Lowers, PA-C  08/18/19 1756

## 2019-08-20 ENCOUNTER — Encounter: Attending: Internal Medicine | Primary: Internal Medicine

## 2019-08-27 ENCOUNTER — Encounter: Attending: Internal Medicine | Primary: Internal Medicine

## 2019-08-30 MED ORDER — SILDENAFIL CITRATE 100 MG PO TABS
100 | ORAL_TABLET | ORAL | 3 refills | Status: DC | PRN
Start: 2019-08-30 — End: 2019-11-01

## 2019-08-30 NOTE — Telephone Encounter (Signed)
Medication Name: sildenafil (VIAGRA)    Medication Dosage: 100 mg (Milligrams)    Monthly Quantity Needed: 10    How many day supply requesting:     Medication Route: Oral (PO)    Medication Administration Time: As Needed (PRN)    Date of Last Refill: (See Medication Tab): 06/09/2019    If taking medication PRN - Reason for taking medication: Erectile Dysfunction    If this is a controlled substance do you receive this or any other controlled medication from any other doctor or facility? N/A    Ordering Physician: Dr. Kyla Balzarine    Date of Last Office Visit: 07/12/2019    Date of Next Office Visit: 09/06/2019    Updated/Validated Preferred Pharmacy: Yes    [facility] [address] [place], [place] - [address] - [place] [phone] Carmon Ginsberg [phone]     Patient instructed to contact the pharmacy prior to picking up the medication: No

## 2019-09-02 ENCOUNTER — Inpatient Hospital Stay: Attending: Gastroenterology | Primary: Internal Medicine

## 2019-09-06 ENCOUNTER — Encounter: Attending: Internal Medicine | Primary: Internal Medicine

## 2019-09-11 NOTE — Telephone Encounter (Signed)
S         Pt is calling the [facility] to discuss tooth pain.  B         Tooth pain started last night and has gotten worse.  A          Pt states he has a broken tooth - lower right molar side and he has been unable to get into dentist. Pain started last night and today pain is 10+/10. Pt c/o right side of face - jaw is swelling and he has been unable to eat; having some right ear pain also; drinking some liquids but difficult to do that at times due to pain. Denies fever, nausea, vomiting, sore throat, congestion. Pt has tried OTC Ibuprofen without relief of pain. Pt states he is miserable.  R        Pt to go to Urgent Care for eval today. Call back if any further ques or concerns.    Reason for Disposition  ??? [1] SEVERE pain (e.g., excruciating, unable to do any normal activities) AND [2] not improved 2 hours after pain medicine  ??? Face is very swollen    Answer Assessment - Initial Assessment Questions  1. LOCATION: "Which tooth is hurting?"  (e.g., right-side/left-side, upper/lower, front/back)      Lower right side molar  2. ONSET: "When did the toothache start?"  (e.g., hours, days)       Last night   3. SEVERITY: "How bad is the toothache?"  (Scale 1-10; mild, moderate or severe)    - MILD (1-3): doesn't interfere with chewing     - MODERATE (4-7): interferes with chewing, interferes with normal activities, awakens from sleep      - SEVERE (8-10): unable to eat, unable to do any normal activities, excruciating pain         Severe 10+  4. SWELLING: "Is there any visible swelling of your face?"      Swelling on right side of face - jaw area  5. OTHER SYMPTOMS: "Do you have any other symptoms?" (e.g., fever)      Slight headache and pain in right ear  6. PREGNANCY: "Is there any chance you are pregnant?" "When was your last menstrual period?"      N/A    Protocols used: TOOTHACHE-ADULT-AH

## 2019-09-14 NOTE — Telephone Encounter (Signed)
noted 

## 2019-10-06 ENCOUNTER — Inpatient Hospital Stay
Admit: 2019-10-06 | Discharge: 2019-10-06 | Disposition: A | Discharge status: Home / Self Care | Payer: PRIVATE HEALTH INSURANCE | Attending: Emergency Medicine

## 2019-10-06 DIAGNOSIS — R519 Headache, unspecified: Secondary | ICD-10-CM

## 2019-10-06 LAB — BASIC METABOLIC PANEL W/ REFLEX TO MG FOR LOW K
Anion Gap: 12 mmol/L (ref 7–16)
BUN: 10 mg/dL (ref 6–20)
CO2: 26 mmol/L (ref 22–29)
Calcium: 9.5 mg/dL (ref 8.6–10.2)
Chloride: 102 mmol/L (ref 98–107)
Creatinine: 1.1 mg/dL (ref 0.7–1.2)
GFR African American: >60
GFR Non-African American: >60 mL/min/{1.73_m2} (ref >=60)
Glucose: 147 mg/dL — ABNORMAL HIGH (ref 74–99)
Potassium reflex Magnesium: 4.3 mmol/L (ref 3.5–5.0)
Sodium: 140 mmol/L (ref 132–146)

## 2019-10-06 LAB — CBC WITH AUTO DIFFERENTIAL
Basophils %: 0.6 % (ref 0.0–2.0)
Basophils Absolute: 0.03 E9/L (ref 0.00–0.20)
Eosinophils %: 3.3 % (ref 0.0–6.0)
Eosinophils Absolute: 0.18 E9/L (ref 0.05–0.50)
Hematocrit: 46.3 % (ref 37.0–54.0)
Hemoglobin: 14.7 g/dL (ref 12.5–16.5)
Immature Granulocytes #: 0.01 E9/L
Immature Granulocytes %: 0.2 % (ref 0.0–5.0)
Lymphocytes %: 29.4 % (ref 20.0–42.0)
Lymphocytes Absolute: 1.59 E9/L (ref 1.50–4.00)
MCH: 23.5 pg — ABNORMAL LOW (ref 26.0–35.0)
MCHC: 31.7 % — ABNORMAL LOW (ref 32.0–34.5)
MCV: 74 fL — ABNORMAL LOW (ref 80.0–99.9)
MPV: 10.4 fL (ref 7.0–12.0)
Monocytes %: 5.9 % (ref 2.0–12.0)
Monocytes Absolute: 0.32 E9/L (ref 0.10–0.95)
Neutrophils %: 60.6 % (ref 43.0–80.0)
Neutrophils Absolute: 3.27 E9/L (ref 1.80–7.30)
Platelets: 296 E9/L (ref 130–450)
RBC: 6.26 E12/L — ABNORMAL HIGH (ref 3.80–5.80)
RDW: 17.4 fL — ABNORMAL HIGH (ref 11.5–15.0)
WBC: 5.4 E9/L (ref 4.5–11.5)

## 2019-10-06 MED ORDER — SODIUM CHLORIDE 0.9 % IV BOLUS
0.9 | Freq: Once | INTRAVENOUS | Status: AC
Start: 2019-10-06 — End: 2019-10-06
  Administered 2019-10-06: 17:00:00 1000 mL via INTRAVENOUS

## 2019-10-06 MED ORDER — DIPHENHYDRAMINE HCL 50 MG/ML IJ SOLN
50 MG/ML | Freq: Once | INTRAMUSCULAR | Status: AC
Start: 2019-10-06 — End: 2019-10-06
  Administered 2019-10-06: 17:00:00 50 mg via INTRAVENOUS

## 2019-10-06 MED ORDER — METOCLOPRAMIDE HCL 5 MG/ML IJ SOLN
5 MG/ML | Freq: Once | INTRAMUSCULAR | Status: AC
Start: 2019-10-06 — End: 2019-10-06
  Administered 2019-10-06: 17:00:00 10 mg via INTRAVENOUS

## 2019-10-06 MED ORDER — KETOROLAC TROMETHAMINE 30 MG/ML IJ SOLN
30 MG/ML | Freq: Once | INTRAMUSCULAR | Status: AC
Start: 2019-10-06 — End: 2019-10-06
  Administered 2019-10-06: 17:00:00 15 mg via INTRAVENOUS

## 2019-10-06 MED FILL — METOCLOPRAMIDE HCL 5 MG/ML IJ SOLN: 5 mg/mL | INTRAMUSCULAR | Qty: 2

## 2019-10-06 MED FILL — KETOROLAC TROMETHAMINE 30 MG/ML IJ SOLN: 30 mg/mL | INTRAMUSCULAR | Qty: 1

## 2019-10-06 MED FILL — DIPHENHYDRAMINE HCL 50 MG/ML IJ SOLN: 50 mg/mL | INTRAMUSCULAR | Qty: 1

## 2019-10-06 NOTE — ED Provider Notes (Signed)
Patient is a 54 year old male presenting the emergency department for headache and numbness.  He states the headache started yesterday, 10 out of 10 in severity, and began with some left arm numbness today.  He states this is like his typical migraine, but he does not usually, seek treatment until he start developing the left arm numbness.  He denies any fevers, nausea, vomiting, diarrhea.  Headache was gradual in onset, has been persistent, nothing makes it better or worse, is associated with left arm numbness, severe in severity.           Review of Systems   Constitutional: Positive for chills. Negative for fever.   HENT: Negative for ear pain, sinus pressure and sore throat.    Eyes: Negative for photophobia, pain, discharge and redness.   Respiratory: Negative for cough, shortness of breath and wheezing.    Cardiovascular: Negative for chest pain.   Gastrointestinal: Negative for abdominal pain, diarrhea, nausea and vomiting.   Genitourinary: Negative for dysuria and frequency.   Musculoskeletal: Negative for arthralgias, back pain, neck pain and neck stiffness.   Skin: Negative for rash and wound.   Neurological: Positive for numbness and headaches. Negative for weakness.   Hematological: Negative for adenopathy.   All other systems reviewed and are negative.       Physical Exam  Vitals and nursing note reviewed.   Constitutional:       Appearance: He is well-developed.   HENT:      Head: Normocephalic and atraumatic.   Eyes:      Conjunctiva/sclera: Conjunctivae normal.   Cardiovascular:      Rate and Rhythm: Normal rate and regular rhythm.      Heart sounds: Normal heart sounds. No murmur heard.     Pulmonary:      Effort: Pulmonary effort is normal. No respiratory distress.      Breath sounds: Normal breath sounds. No wheezing or rales.   Abdominal:      General: Bowel sounds are normal.      Palpations: Abdomen is soft.      Tenderness: There is no abdominal tenderness. There is no guarding or rebound.    Musculoskeletal:         General: No tenderness or deformity.      Cervical back: Normal range of motion and neck supple. No rigidity or tenderness.   Skin:     General: Skin is warm and dry.   Neurological:      General: No focal deficit present.      Mental Status: He is alert and oriented to person, place, and time.      Cranial Nerves: No cranial nerve deficit.      Sensory: No sensory deficit.      Motor: No weakness.      Coordination: Coordination normal.          Procedures     MDM     ED Course as of Oct 05 1801   Wed Oct 06, 2019   1253 EKG:  This EKG is signed by emergency department physician.    Rate: 66  Rhythm: Sinus  Interpretation: no acute changes  Comparison: was normal         [JG]   1332 Patient presenting the emergency department for headache.  He has a history of migraines, said this felt like his typical migraine, he also is complaining of some left arm paresthesias which he said he can get when he has his severe headaches.  Was treated, said he was feeling improved, his numbness and headache were both improving, he felt comfortable going home, return precautions given, instructed follow-up with primary care physician.    [JG]      ED Course User Index  [JG] Lanice Shirts, MD      Patient presenting the emergency department for headache.  He has a history of migraines, said this felt like his typical migraine, he also is complaining of some left arm paresthesias which he said he can get when he has his severe headaches.  Was treated, said he was feeling improved, his numbness and headache were both improving, he felt comfortable going home, return precautions given, instructed follow-up with primary care physician.       ED Course as of Oct 05 1801   Wed Oct 06, 2019   1253 EKG:  This EKG is signed by emergency department physician.    Rate: 66  Rhythm: Sinus  Interpretation: no acute changes  Comparison: was normal         [JG]   1332 Patient presenting the emergency department for  headache.  He has a history of migraines, said this felt like his typical migraine, he also is complaining of some left arm paresthesias which he said he can get when he has his severe headaches.  Was treated, said he was feeling improved, his numbness and headache were both improving, he felt comfortable going home, return precautions given, instructed follow-up with primary care physician.    [JG]      ED Course User Index  [JG] Lanice Shirts, MD       --------------------------------------------- PAST HISTORY ---------------------------------------------  Past Medical History:  has a past medical history of Asthma, Chronic back pain, Erectile dysfunction, GERD (gastroesophageal reflux disease), and Migraine.    Past Surgical History:  has a past surgical history that includes Orbital fracture repair (Left); Cardiac catheterization; Umbilical hernia repair; hernia repair; and Hand surgery (Right).    Social History:  reports that he has been smoking cigarettes. He started smoking about 33 years ago. He has been smoking about 0.50 packs per day. He has never used smokeless tobacco. He reports current drug use. Drug: Marijuana. He reports that he does not drink alcohol.    Family History: family history includes Asthma in his mother; Breast Cancer in his maternal aunt, maternal grandfather, maternal uncle, and maternal uncle; No Known Problems in his daughter, father, and son.     The patient???s home medications have been reviewed.    Allergies: Contrast [iodides] and Iodine    -------------------------------------------------- RESULTS -------------------------------------------------  Labs:  Results for orders placed or performed during the hospital encounter of 10/06/19   CBC Auto Differential   Result Value Ref Range    WBC 5.4 4.5 - 11.5 E9/L    RBC 6.26 (H) 3.80 - 5.80 E12/L    Hemoglobin 14.7 12.5 - 16.5 g/dL    Hematocrit 35.5 73.2 - 54.0 %    MCV 74.0 (L) 80.0 - 99.9 fL    MCH 23.5 (L) 26.0 - 35.0 pg    MCHC  31.7 (L) 32.0 - 34.5 %    RDW 17.4 (H) 11.5 - 15.0 fL    Platelets 296 130 - 450 E9/L    MPV 10.4 7.0 - 12.0 fL    Neutrophils % 60.6 43.0 - 80.0 %    Immature Granulocytes % 0.2 0.0 - 5.0 %    Lymphocytes % 29.4 20.0 - 42.0 %  Monocytes % 5.9 2.0 - 12.0 %    Eosinophils % 3.3 0.0 - 6.0 %    Basophils % 0.6 0.0 - 2.0 %    Neutrophils Absolute 3.27 1.80 - 7.30 E9/L    Immature Granulocytes # 0.01 E9/L    Lymphocytes Absolute 1.59 1.50 - 4.00 E9/L    Monocytes Absolute 0.32 0.10 - 0.95 E9/L    Eosinophils Absolute 0.18 0.05 - 0.50 E9/L    Basophils Absolute 0.03 0.00 - 0.20 E9/L   Basic Metabolic Panel w/ Reflex to MG   Result Value Ref Range    Sodium 140 132 - 146 mmol/L    Potassium reflex Magnesium 4.3 3.5 - 5.0 mmol/L    Chloride 102 98 - 107 mmol/L    CO2 26 22 - 29 mmol/L    Anion Gap 12 7 - 16 mmol/L    Glucose 147 (H) 74 - 99 mg/dL    BUN 10 6 - 20 mg/dL    CREATININE 1.1 0.7 - 1.2 mg/dL    GFR Non-African American >60 >=60 mL/min/1.73    GFR African American >60     Calcium 9.5 8.6 - 10.2 mg/dL   EKG 12 Lead   Result Value Ref Range    Ventricular Rate 66 BPM    Atrial Rate 66 BPM    P-R Interval 170 ms    QRS Duration 100 ms    Q-T Interval 414 ms    QTc Calculation (Bazett) 434 ms    P Axis 16 degrees    R Axis 74 degrees    T Axis 69 degrees       Radiology:  No orders to display       ------------------------- NURSING NOTES AND VITALS REVIEWED ---------------------------  Date / Time Roomed:  10/06/2019 11:58 AM  ED Bed Assignment:  26/26    The nursing notes within the ED encounter and vital signs as below have been reviewed.   BP 139/78    Pulse 66    Temp 98 ??F (36.7 ??C) (Temporal)    Resp 16    Ht 6\' 3"  (1.905 m)    Wt 215 lb (97.5 kg)    SpO2 95%    BMI 26.87 kg/m??   Oxygen Saturation Interpretation: Normal      ------------------------------------------ PROGRESS NOTES ------------------------------------------  6:03 PM EDT  I have spoken with the patient and discussed today???s results, in addition  to providing specific details for the plan of care and counseling regarding the diagnosis and prognosis.  Their questions are answered at this time and they are agreeable with the plan. I discussed at length with them reasons for immediate return here for re evaluation. They will followup with their primary care physician by calling their office tomorrow.      --------------------------------- ADDITIONAL PROVIDER NOTES ---------------------------------  At this time the patient is without objective evidence of an acute process requiring hospitalization or inpatient management.  They have remained hemodynamically stable throughout their entire ED visit and are stable for discharge with outpatient follow-up.     The plan has been discussed in detail and they are aware of the specific conditions for emergent return, as well as the importance of follow-up.      Discharge Medication List as of 10/06/2019  1:32 PM          Diagnosis:  1. Acute nonintractable headache, unspecified headache type    2. Paresthesia        Disposition:  Patient's  disposition: Discharge to home  Patient's condition is stable.           Lanice Shirts, MD  Resident  10/06/19 [phone]

## 2019-10-07 LAB — EKG 12-LEAD
Atrial Rate: 66 {beats}/min
P Axis: 16 degrees
P-R Interval: 170 ms
Q-T Interval: 414 ms
QRS Duration: 100 ms
QTc Calculation (Bazett): 434 ms
R Axis: 74 degrees
T Axis: 69 degrees
Ventricular Rate: 66 {beats}/min

## 2019-10-14 NOTE — Telephone Encounter (Signed)
Patient seen in [facility]  ED on 10/06/19  Reason: Headache, Numbness    Per ED discharge instructions:    Follow-up with primary care physician.     Attempted to contact patient, check status and assist with follow up per discharge instructions with the following results:     Left message on TAD that office called with call back number. If patient returns call please check status. If symptoms are not improving please assist in scheduling ED follow up appointment.

## 2019-10-26 ENCOUNTER — Emergency Department: Admit: 2019-10-26 | Payer: PRIVATE HEALTH INSURANCE | Primary: Internal Medicine

## 2019-10-26 ENCOUNTER — Inpatient Hospital Stay
Admit: 2019-10-26 | Discharge: 2019-10-26 | Disposition: A | Discharge status: Home / Self Care | Payer: PRIVATE HEALTH INSURANCE | Attending: Emergency Medicine

## 2019-10-26 DIAGNOSIS — J4520 Mild intermittent asthma, uncomplicated: Secondary | ICD-10-CM

## 2019-10-26 LAB — BASIC METABOLIC PANEL
Anion Gap: 14 mmol/L (ref 7–16)
BUN: 14 mg/dL (ref 6–20)
CO2: 23 mmol/L (ref 22–29)
Calcium: 9 mg/dL (ref 8.6–10.2)
Chloride: 104 mmol/L (ref 98–107)
Creatinine: 0.9 mg/dL (ref 0.7–1.2)
GFR African American: >60
GFR Non-African American: >60 mL/min/{1.73_m2} (ref >=60)
Glucose: 102 mg/dL — ABNORMAL HIGH (ref 74–99)
Potassium: 3.8 mmol/L (ref 3.5–5.0)
Sodium: 141 mmol/L (ref 132–146)

## 2019-10-26 LAB — CBC WITH AUTO DIFFERENTIAL
Basophils %: 0.5 % (ref 0.0–2.0)
Basophils Absolute: 0.03 E9/L (ref 0.00–0.20)
Eosinophils %: 2.8 % (ref 0.0–6.0)
Eosinophils Absolute: 0.18 E9/L (ref 0.05–0.50)
Hematocrit: 43.6 % (ref 37.0–54.0)
Hemoglobin: 14.1 g/dL (ref 12.5–16.5)
Immature Granulocytes #: 0.01 E9/L
Immature Granulocytes %: 0.2 % (ref 0.0–5.0)
Lymphocytes %: 43 % — ABNORMAL HIGH (ref 20.0–42.0)
Lymphocytes Absolute: 2.78 E9/L (ref 1.50–4.00)
MCH: 23.5 pg — ABNORMAL LOW (ref 26.0–35.0)
MCHC: 32.3 % (ref 32.0–34.5)
MCV: 72.5 fL — ABNORMAL LOW (ref 80.0–99.9)
MPV: 10.8 fL (ref 7.0–12.0)
Monocytes %: 9.3 % (ref 2.0–12.0)
Monocytes Absolute: 0.6 E9/L (ref 0.10–0.95)
Neutrophils %: 44.2 % (ref 43.0–80.0)
Neutrophils Absolute: 2.86 E9/L (ref 1.80–7.30)
Platelets: 330 E9/L (ref 130–450)
RBC: 6.01 E12/L — ABNORMAL HIGH (ref 3.80–5.80)
RDW: 17.4 fL — ABNORMAL HIGH (ref 11.5–15.0)
WBC: 6.5 E9/L (ref 4.5–11.5)

## 2019-10-26 LAB — BRAIN NATRIURETIC PEPTIDE: Pro-BNP: 115 pg/mL (ref 0–125)

## 2019-10-26 MED ORDER — IPRATROPIUM-ALBUTEROL 0.5-2.5 (3) MG/3ML IN SOLN
RESPIRATORY_TRACT | Status: DC
Start: 2019-10-26 — End: 2019-10-26

## 2019-10-26 MED ORDER — ALBUTEROL SULFATE HFA 108 (90 BASE) MCG/ACT IN AERS
108 (90 Base) MCG/ACT | Freq: Four times a day (QID) | RESPIRATORY_TRACT | 1 refills | Status: AC | PRN
Start: 2019-10-26 — End: ?

## 2019-10-26 MED ORDER — IPRATROPIUM-ALBUTEROL 0.5-2.5 (3) MG/3ML IN SOLN
Freq: Once | RESPIRATORY_TRACT | Status: AC
Start: 2019-10-26 — End: 2019-10-26
  Administered 2019-10-26: 09:00:00 1 via RESPIRATORY_TRACT

## 2019-10-26 MED FILL — IPRATROPIUM-ALBUTEROL 0.5-2.5 (3) MG/3ML IN SOLN: 0.5-2.5 (3) MG/3ML | RESPIRATORY_TRACT | Qty: 3

## 2019-10-26 NOTE — ED Provider Notes (Addendum)
HPI:  10/26/19, Time: 4:35 AM EDT         Stephen Riley is a 54 y.o. male presenting to the ED for history of asthma shortness of breath, beginning hours ago.  The complaint has been persistent, moderate in severity, and worsened by nothing.  He does have a history of asthma.  Patient reports his breathing has been acting up last couple days mainly at night.  Patient reporting no chest pain no abdominal pain no vomiting he reports no leg pain or swelling he reports no fever chills he reports no diarrhea.  Patient reports has not been vaccinated for Covid.  Patient reporting no headache he reports no neck pain.  He reports no weakness or dizziness or syncopal episode he does admit to smoking.  He reports no hemoptysis.    ROS:   Pertinent positives and negatives are stated within HPI, all other systems reviewed and are negative.  --------------------------------------------- PAST HISTORY ---------------------------------------------  Past Medical History:  has a past medical history of Asthma, Chronic back pain, Erectile dysfunction, GERD (gastroesophageal reflux disease), and Migraine.    Past Surgical History:  has a past surgical history that includes Orbital fracture repair (Left); Cardiac catheterization; Umbilical hernia repair; hernia repair; and Hand surgery (Right).    Social History:  reports that he has been smoking cigarettes. He started smoking about 34 years ago. He has been smoking about 0.50 packs per day. He has never used smokeless tobacco. He reports current drug use. Drug: Marijuana. He reports that he does not drink alcohol.    Family History: family history includes Asthma in his mother; Breast Cancer in his maternal aunt, maternal grandfather, maternal uncle, and maternal uncle; No Known Problems in his daughter, father, and son.     The patient???s home medications have been reviewed.    Allergies: Contrast [iodides] and Iodine    ---------------------------------------------------PHYSICAL  EXAM--------------------------------------    Constitutional/General: Alert and oriented x3, well appearing, non toxic in NAD  Head: Normocephalic and atraumatic  Eyes: PERRL, EOMI  Mouth: Oropharynx clear, handling secretions, no trismus  Neck: Supple, full ROM, non tender to palpation in the midline, no stridor, no crepitus, no meningeal signs  Pulmonary: Lungs clear to auscultation bilaterally, no wheezes, rales, or rhonchi. Not in respiratory distress  Cardiovascular:  Regular rate. Regular rhythm. No murmurs, gallops, or rubs. 2+ distal pulses  Chest: no chest wall tenderness  Abdomen: Soft.  Non tender. Non distended.  +BS.  No rebound, guarding, or rigidity. No pulsatile masses appreciated.  Musculoskeletal: Moves all extremities x 4. Warm and well perfused, no clubbing, cyanosis, or edema. Capillary refill <3 seconds  Skin: warm and dry. No rashes.   Neurologic: GCS 15, CN 2-12 grossly intact, no focal deficits, symmetric strength 5/5 in the upper and lower extremities bilaterally  Psych: Normal Affect    -------------------------------------------------- RESULTS -------------------------------------------------  I have personally reviewed all laboratory and imaging results for this patient. Results are listed below.     LABS:  Results for orders placed or performed during the hospital encounter of 10/26/19   CBC auto differential   Result Value Ref Range    WBC 6.5 4.5 - 11.5 E9/L    RBC 6.01 (H) 3.80 - 5.80 E12/L    Hemoglobin 14.1 12.5 - 16.5 g/dL    Hematocrit 71.0 62.6 - 54.0 %    MCV 72.5 (L) 80.0 - 99.9 fL    MCH 23.5 (L) 26.0 - 35.0 pg    MCHC 32.3  32.0 - 34.5 %    RDW 17.4 (H) 11.5 - 15.0 fL    Platelets 330 130 - 450 E9/L    MPV 10.8 7.0 - 12.0 fL    Neutrophils % 44.2 43.0 - 80.0 %    Immature Granulocytes % 0.2 0.0 - 5.0 %    Lymphocytes % 43.0 (H) 20.0 - 42.0 %    Monocytes % 9.3 2.0 - 12.0 %    Eosinophils % 2.8 0.0 - 6.0 %    Basophils % 0.5 0.0 - 2.0 %    Neutrophils Absolute 2.86 1.80 - 7.30  E9/L    Immature Granulocytes # 0.01 E9/L    Lymphocytes Absolute 2.78 1.50 - 4.00 E9/L    Monocytes Absolute 0.60 0.10 - 0.95 E9/L    Eosinophils Absolute 0.18 0.05 - 0.50 E9/L    Basophils Absolute 0.03 0.00 - 0.20 E9/L   Basic Metabolic Panel   Result Value Ref Range    Sodium 141 132 - 146 mmol/L    Potassium 3.8 3.5 - 5.0 mmol/L    Chloride 104 98 - 107 mmol/L    CO2 23 22 - 29 mmol/L    Anion Gap 14 7 - 16 mmol/L    Glucose 102 (H) 74 - 99 mg/dL    BUN 14 6 - 20 mg/dL    CREATININE 0.9 0.7 - 1.2 mg/dL    GFR Non-African American >60 >=60 mL/min/1.73    GFR African American >60     Calcium 9.0 8.6 - 10.2 mg/dL   Brain Natriuretic Peptide   Result Value Ref Range    Pro-BNP 115 0 - 125 pg/mL       RADIOLOGY:  Interpreted by Radiologist.  XR CHEST PORTABLE   Final Result   Mild interstitial septal thickening may represent mild interstitial edema.                     ------------------------- NURSING NOTES AND VITALS REVIEWED ---------------------------   The nursing notes within the ED encounter and vital signs as below have been reviewed by myself.  BP (!) 158/86    Pulse 85    Temp 97.1 ??F (36.2 ??C) (Temporal)    Resp 18    Ht 6\' 3"  (1.905 m)    Wt 215 lb (97.5 kg)    SpO2 97%    BMI 26.87 kg/m??   Oxygen Saturation Interpretation: Normal    The patient???s available past medical records and past encounters were reviewed.        ------------------------------ ED COURSE/MEDICAL DECISION MAKING----------------------  Medications   ipratropium-albuterol (DUONEB) nebulizer solution 1 ampule (1 ampule Inhalation Given 10/26/19 0503)             Medical Decision Making:   Presenting here because of shortness of breath he reports feeling congested.  Patient has a history of asthma.  Patient reporting no hemoptysis no chest pain no abdominal pain no vomiting or diarrhea.  Patient reporting no leg pain or swelling.  Patient is awake alert oriented x3 lungs are clear labs are reviewed as well as chest x-ray.  Patient  medicated and improved.  Patient comfortable being discharged home he will be discharged with outpatient follow-up.     Re-Evaluations:             Re-evaluation.  Patient???s symptoms show no change    Reevaluated and given breathing treatment.  Patient no respiratory distress reporting no pain or discomfort.  Patient made aware lab  findings as well as x-ray findings patient return if symptoms worsen or persist.  Patient okay with being discharged home patient nontoxic on discharge  Consultations:                 Critical Care:         This patient's ED course included: a personal history and physicial eaxmination    This patient has been closely monitored during their ED course.    Counseling:   The emergency provider has spoken with the patient and discussed today???s results, in addition to providing specific details for the plan of care and counseling regarding the diagnosis and prognosis.  Questions are answered at this time and they are agreeable with the plan.       --------------------------------- IMPRESSION AND DISPOSITION ---------------------------------    IMPRESSION  1. Mild intermittent asthma without complication    2. Mild asthma without complication, unspecified whether persistent        DISPOSITION  Disposition: Discharge to home  Patient condition is stable        NOTE: This report was transcribed using voice recognition software. Every effort was made to ensure accuracy; however, inadvertent computerized transcription errors may be present          Dwain Sarna, MD  10/26/19 0441              Dwain Sarna, MD  10/26/19 [phone]

## 2019-10-26 NOTE — ED Notes (Signed)
Resp notified of need for treatments     Loanne Drilling, RN  10/26/19 0500

## 2019-11-01 MED ORDER — SILDENAFIL CITRATE 100 MG PO TABS
100 MG | ORAL_TABLET | ORAL | 3 refills | Status: DC
Start: 2019-11-01 — End: 2020-01-31

## 2019-11-01 NOTE — Telephone Encounter (Signed)
07/12/2019  Visit date not found

## 2019-11-03 NOTE — Telephone Encounter (Signed)
Patient seen in [facility] ED on 10/26/19  Reason: Shortness of Breath    Per ED discharge instructions:   Schedule an appointment with Argie Ramming, DO (Internal Medicine)    Attempted to contact patient, check status and assist with follow up per discharge instructions with the following results:     Have these symptoms resolved?   Patient states I am better. Denies shortness of breath.    Are you taking medications as prescribed?   Yes.    Any problems?  Declines problems.    Follow up plan:   Self monitor and will schedule appointment as needed.    Anything else I can help with:   Declines further assistance.    If symptoms worsen or fail to improve please call our office we have same day and urgent appointments available. We can schedule you an ED follow up apt. Pt expressed understanding.

## 2019-12-07 NOTE — Telephone Encounter (Signed)
Patient seen in [facility] ED on 12/02/19  Reason: Fall    Per ED discharge instructions:   No specific instructions given.    Attempted to contact patient, check status and assist with follow up per discharge instructions with the following results:     Left message on TAD that office called with call back number. If patient returns call please check status. If symptoms are not improving please assist in scheduling ED follow up appointment.

## 2020-01-31 NOTE — Telephone Encounter (Signed)
07/12/2019  Visit date not found

## 2020-02-01 MED ORDER — SILDENAFIL CITRATE 100 MG PO TABS
100 MG | ORAL_TABLET | ORAL | 3 refills | Status: DC
Start: 2020-02-01 — End: 2020-04-18

## 2020-02-23 NOTE — Telephone Encounter (Signed)
Patient seen in [facility] ED on  02/16/20  Reason: Chest Pain    Per ED discharge instructions:   No specific instructions given.    Attempted to contact patient, check status and assist with follow up per discharge instructions with the following results:     Left message on TAD that office called with call back number. If patient returns call please check status. If symptoms are not improving please assist in scheduling ED follow up appointment.     ED message sent via Mychart.

## 2020-04-18 NOTE — Telephone Encounter (Signed)
From: Netta Neat  To: Dr. Argie Ramming  Sent: 04/18/2020 11:49 AM EST  Subject: Prescription refill    I would like to refill my sildenafil prescription.  [facility]   [address]  [place], [place]. [address]  [phone]

## 2020-04-18 NOTE — Telephone Encounter (Signed)
07/12/2019  Visit date not found

## 2020-04-19 MED ORDER — SILDENAFIL CITRATE 100 MG PO TABS
100 MG | ORAL_TABLET | ORAL | 3 refills | Status: AC
Start: 2020-04-19 — End: ?

## 2020-05-23 NOTE — Telephone Encounter (Signed)
S:  Patient is calling the [facility] with complaint of lower back pain, L buttocks and L leg pain as well     B:  Ongoing for 2 weeks     A:  Pt states he is having a sciatica flare up causing lower back pain, L buttocks pain and L leg pain for about 2 weeks. Pt is treating with Acetaminophen. Pt denies any injury. Pt states he is having intermittent tingling and numbness to his L side of groin. Pt denies current groin numbness.     Pharmacy:   [facility]  [address]  [place], [place] [address]  [phone]      R:   Home Care advice given for back pain. Called office back line for second level triage with Dr. Kyla Balzarine. He advised pt seen for officce appt. Pt is currently in [place] and requesting prescription medication called into pharmacy. Explained to pt a message will be sent to his provider for review.   Patient instructed to call back with new or worsening symptoms.  Patient verbalized understanding.     Allergies and pharmacy verified     Reason for Disposition  ??? Pain radiates into groin, scrotum    Protocols used: BACK PAIN-ADULT-[place]

## 2020-05-23 NOTE — Telephone Encounter (Signed)
I'm not licensed to practice in [place] and he will need to seek care there, perhaps at an urgent care. He could try using OTC naproxen (Aleve).

## 2020-05-24 NOTE — Telephone Encounter (Signed)
Made pt aware

## 2020-05-25 NOTE — Telephone Encounter (Signed)
error 

## 2020-12-08 NOTE — Telephone Encounter (Signed)
Medication Name: sildenafil (VIAGRA) 100 MG tablet       Medication Dosage: 100 mg (Milligrams)    Monthly Quantity Needed:  10    How many day supply requesting: 30 days  3 refills  Medication Route: Oral (PO)    Medication Administration Time: TAKE 1 TABLET BY MOUTH AS NEEDED for E.D. AS DIRECTED    Date of Last Refill: (See Medication Tab): 04-18-2020    If taking medication PRN - Reason for taking medication: as needed for ED    If this is a controlled substance do you receive this or any other controlled medication from any other doctor or facility? N/A    Ordering Physician: Kyla Balzarine    Date of Last Office Visit: 07/12/2019    Date of Next Office Visit: na    Updated/Validated Preferred Pharmacy: Yes    Patient instructed to contact the pharmacy prior to picking up the medication: Yes

## 2020-12-08 NOTE — Telephone Encounter (Signed)
Schedule for 10/06 @1 :00

## 2020-12-08 NOTE — Telephone Encounter (Signed)
Called pt no answer LVMTCB.

## 2020-12-08 NOTE — Telephone Encounter (Signed)
Pt. Called back in wants to know if possible to do a virtual visit, as he is out of town taking care of someone, please cll pt. Back and advise

## 2020-12-08 NOTE — Telephone Encounter (Signed)
Refill on Viagra denied. Needs a follow-up appt w me. Thanks.

## 2021-01-18 ENCOUNTER — Encounter: Payer: PRIVATE HEALTH INSURANCE | Attending: Internal Medicine | Primary: Internal Medicine

## 2021-02-18 ENCOUNTER — Emergency Department (HOSPITAL_BASED_OUTPATIENT_CLINIC_OR_DEPARTMENT_OTHER): Payer: Medicaid Other

## 2021-02-18 ENCOUNTER — Other Ambulatory Visit: Payer: Self-pay

## 2021-02-18 ENCOUNTER — Encounter (HOSPITAL_BASED_OUTPATIENT_CLINIC_OR_DEPARTMENT_OTHER): Payer: Self-pay | Admitting: *Deleted

## 2021-02-18 ENCOUNTER — Observation Stay (HOSPITAL_BASED_OUTPATIENT_CLINIC_OR_DEPARTMENT_OTHER)
Admission: EM | Admit: 2021-02-18 | Discharge: 2021-02-20 | Disposition: A | Discharge status: Home / Self Care | Payer: Self-pay | Attending: Internal Medicine | Admitting: Internal Medicine

## 2021-02-18 DIAGNOSIS — R202 Paresthesia of skin: Principal | ICD-10-CM | POA: Diagnosis present

## 2021-02-18 DIAGNOSIS — Z20822 Contact with and (suspected) exposure to covid-19: Secondary | ICD-10-CM | POA: Insufficient documentation

## 2021-02-18 DIAGNOSIS — E669 Obesity, unspecified: Secondary | ICD-10-CM | POA: Diagnosis present

## 2021-02-18 DIAGNOSIS — Z720 Tobacco use: Secondary | ICD-10-CM | POA: Diagnosis present

## 2021-02-18 DIAGNOSIS — R079 Chest pain, unspecified: Secondary | ICD-10-CM | POA: Diagnosis not present

## 2021-02-18 DIAGNOSIS — R519 Headache, unspecified: Secondary | ICD-10-CM

## 2021-02-18 DIAGNOSIS — F17210 Nicotine dependence, cigarettes, uncomplicated: Secondary | ICD-10-CM | POA: Insufficient documentation

## 2021-02-18 DIAGNOSIS — J45909 Unspecified asthma, uncomplicated: Secondary | ICD-10-CM | POA: Insufficient documentation

## 2021-02-18 DIAGNOSIS — Z1389 Encounter for screening for other disorder: Secondary | ICD-10-CM

## 2021-02-18 DIAGNOSIS — Z8673 Personal history of transient ischemic attack (TIA), and cerebral infarction without residual deficits: Secondary | ICD-10-CM

## 2021-02-18 DIAGNOSIS — G43919 Migraine, unspecified, intractable, without status migrainosus: Secondary | ICD-10-CM | POA: Diagnosis present

## 2021-02-18 HISTORY — DX: Cerebral infarction, unspecified: I63.9

## 2021-02-18 HISTORY — DX: Unspecified asthma, uncomplicated: J45.909

## 2021-02-18 LAB — CBC WITH DIFFERENTIAL/PLATELET
Abs Immature Granulocytes: 0.03 10*3/uL (ref 0.00–0.07)
Basophils Absolute: 0 10*3/uL (ref 0.0–0.1)
Basophils Relative: 0 %
Eosinophils Absolute: 0.2 10*3/uL (ref 0.0–0.5)
Eosinophils Relative: 2 %
HCT: 44.4 % (ref 39.0–52.0)
Hemoglobin: 14.3 g/dL (ref 13.0–17.0)
Immature Granulocytes: 0 %
Lymphocytes Relative: 30 %
Lymphs Abs: 2.7 10*3/uL (ref 0.7–4.0)
MCH: 23.6 pg — ABNORMAL LOW (ref 26.0–34.0)
MCHC: 32.2 g/dL (ref 30.0–36.0)
MCV: 73.3 fL — ABNORMAL LOW (ref 80.0–100.0)
Monocytes Absolute: 0.8 10*3/uL (ref 0.1–1.0)
Monocytes Relative: 9 %
Neutro Abs: 5.3 10*3/uL (ref 1.7–7.7)
Neutrophils Relative %: 59 %
Platelets: 312 10*3/uL (ref 150–400)
RBC: 6.06 MIL/uL — ABNORMAL HIGH (ref 4.22–5.81)
RDW: 16.8 % — ABNORMAL HIGH (ref 11.5–15.5)
WBC: 9 10*3/uL (ref 4.0–10.5)
nRBC: 0 % (ref 0.0–0.2)

## 2021-02-18 LAB — PROTIME-INR
INR: 1 (ref 0.8–1.2)
Prothrombin Time: 12.7 seconds (ref 11.4–15.2)

## 2021-02-18 LAB — COMPREHENSIVE METABOLIC PANEL
ALT: 20 U/L (ref 0–44)
AST: 30 U/L (ref 15–41)
Albumin: 4.6 g/dL (ref 3.5–5.0)
Alkaline Phosphatase: 87 U/L (ref 38–126)
Anion gap: 8 (ref 5–15)
BUN: 17 mg/dL (ref 6–20)
CO2: 25 mmol/L (ref 22–32)
Calcium: 9.3 mg/dL (ref 8.9–10.3)
Chloride: 106 mmol/L (ref 98–111)
Creatinine, Ser: 1.04 mg/dL (ref 0.61–1.24)
GFR, Estimated: >60 mL/min (ref >60)
Glucose, Bld: 107 mg/dL — ABNORMAL HIGH (ref 70–99)
Potassium: 3.6 mmol/L (ref 3.5–5.1)
Sodium: 139 mmol/L (ref 135–145)
Total Bilirubin: 0.8 mg/dL (ref 0.3–1.2)
Total Protein: 8.3 g/dL — ABNORMAL HIGH (ref 6.5–8.1)

## 2021-02-18 LAB — CBG MONITORING, ED: Glucose-Capillary: 106 mg/dL — ABNORMAL HIGH (ref 70–99)

## 2021-02-18 NOTE — ED Triage Notes (Addendum)
Pt reports headache, chest pain, blurred vision, gait trouble, and numbness in left hand since 1100 am. Left hand grip weaker than right. Pt alert. States he worked a 16 hour shift today

## 2021-02-18 NOTE — ED Provider Notes (Signed)
[facility] EMERGENCY DEPARTMENT Provider Note   CSN: [id number] Arrival date & time: 02/18/21  2253     History Chief Complaint  Patient presents with   Numbness    Casey Morris is a 55 y.o. male.  The history is provided by the patient.  Casey Morris is a 55 y.o. male who presents to the Emergency Department complaining of headache and numbness. He presents to the emergency department for evaluation of headache and left upper extremity numbness that started at 11 AM this morning. He was last known well at 7 AM this morning. He worked his shift and then presented to the emergency department for evaluation. Headache is described as global and severe. It was gradual in onset. He has associated photophobia. On ED arrival he reports developing chest pressure as well as left upper quadrant pressure. No fevers, nausea, vomiting, diarrhea. He does state that he feels fatigued and his vision feels blurred at times. No diplopia. He does have a history of prior CVA about 10 years ago. He states that he had left-sided deficits at that time that have completely resolved. He describes today's symptoms as similar to his prior CVA. He also reports a history of prior cardiac cath due to chest pain in 2012 that was negative per patient. He describes his chest pain as similar to prior episodes of recurrent chest pain. He also has a history of migraine headaches and describes his headache today is similar to his prior migraines. He does use tobacco. He has a remote history of cocaine use but this is been greater than 10 years ago. No IV drug use. Occasional alcohol. He is right-hand dominant.    Past Medical History:  Diagnosis Date   Asthma    Stroke [facility])     Patient Active Problem List   Diagnosis Date Noted   Acute ischemic stroke (HCC) 02/19/2021    Past Surgical History:  Procedure Laterality Date   CARDIAC CATHETERIZATION         No family history on file.  Social History    Tobacco Use   Smoking status: Every Day    Types: Cigarettes   Smokeless tobacco: Never  Substance Use Topics   Alcohol use: Yes   Drug use: Yes    Types: Marijuana    Home Medications Prior to Admission medications   Not on File    Allergies    Iodinated diagnostic agents  Review of Systems   Review of Systems  All other systems reviewed and are negative.  Physical Exam Updated Vital Signs BP 121/80   Pulse 83   Temp 98.9 F (37.2 C) (Oral)   Resp 18   Ht 6\' 3"  (1.905 m)   Wt 108.9 kg   SpO2 99%   BMI 30.00 kg/m   Physical Exam Vitals and nursing note reviewed.  Constitutional:      Appearance: He is well-developed.  HENT:     Head: Normocephalic and atraumatic.  Cardiovascular:     Rate and Rhythm: Normal rate and regular rhythm.     Heart sounds: No murmur heard. Pulmonary:     Effort: Pulmonary effort is normal. No respiratory distress.     Breath sounds: Normal breath sounds.  Abdominal:     Palpations: Abdomen is soft.     Tenderness: There is no abdominal tenderness. There is no guarding or rebound.  Musculoskeletal:        General: No tenderness.  Skin:    General: Skin is  warm and dry.  Neurological:     Mental Status: He is alert and oriented to person, place, and time.     Comments: No asymmetry of facial movements. Visual fields grossly intact. There is mild pronated or drift in the left upper extremity. 4+ out of five strength in the left upper extremity. Five out of five strength in the right upper extremity. Five out of five strength and bilateral lower extremities. Sensation light touch intact in all four extremities. Speech is fluent. Normal gait.  Psychiatric:        Behavior: Behavior normal.    ED Results / Procedures / Treatments   Labs (all labs ordered are listed, but only abnormal results are displayed) Labs Reviewed  COMPREHENSIVE METABOLIC PANEL - Abnormal; Notable for the following components:      Result Value    Glucose, Bld 107 (*)    Total Protein 8.3 (*)    All other components within normal limits  CBC WITH DIFFERENTIAL/PLATELET - Abnormal; Notable for the following components:   RBC 6.06 (*)    MCV 73.3 (*)    MCH 23.6 (*)    RDW 16.8 (*)    All other components within normal limits  URINALYSIS, ROUTINE W REFLEX MICROSCOPIC - Abnormal; Notable for the following components:   APPearance HAZY (*)    Bilirubin Urine SMALL (*)    Ketones, ur 40 (*)    Protein, ur 30 (*)    All other components within normal limits  URINALYSIS, MICROSCOPIC (REFLEX) - Abnormal; Notable for the following components:   Bacteria, UA RARE (*)    All other components within normal limits  CBG MONITORING, ED - Abnormal; Notable for the following components:   Glucose-Capillary 106 (*)    All other components within normal limits  RESP PANEL BY RT-PCR (FLU A&B, COVID) ARPGX2  PROTIME-INR  TROPONIN I (HIGH SENSITIVITY)  TROPONIN I (HIGH SENSITIVITY)    EKG EKG Interpretation  Date/Time:  Sunday February 18 2021 23:05:04 EST Ventricular Rate:  109 PR Interval:  162 QRS Duration: 92 QT Interval:  356 QTC Calculation: 479 R Axis:   58 Text Interpretation: Sinus tachycardia with occasional Premature ventricular complexes Otherwise normal ECG Confirmed by Tilden Fossa [id number]) on 02/18/2021 11:16:27 PM  Radiology DG Chest 2 View  Result Date: 02/18/2021 CLINICAL DATA:  Chest pain and headache.  Blurry vision. EXAM: CHEST - 2 VIEW COMPARISON:  None. FINDINGS: The cardiomediastinal contours are normal. There is biapical pleuroparenchymal scarring and blebs. Mild hyperinflation. Central bronchial thickening. Pulmonary vasculature is normal. No consolidation, pleural effusion, or pneumothorax. No acute osseous abnormalities are seen. IMPRESSION: Central bronchial thickening and mild hyperinflation suggesting bronchitis or asthma. Electronically Signed   By: Narda Rutherford M.D.   On: 02/18/2021 23:57   CT Head Wo  Contrast  Result Date: 02/18/2021 CLINICAL DATA:  Left upper extremity numbness and headache. EXAM: CT HEAD WITHOUT CONTRAST TECHNIQUE: Contiguous axial images were obtained from the base of the skull through the vertex without intravenous contrast. COMPARISON:  None. FINDINGS: Brain: There is a 2.5 x 2.2 x 2.2 cm focal encephalomalacia consistent with a chronic cortical infarct in the lower lateral right parietal lobe. There is early cerebral cortical atrophy without significant findings of small vessel disease. Cerebellum and brainstem are unremarkable with no focal asymmetry concerning for acute infarct, hemorrhage or mass. Ventricles are normal in size and position. Vascular: There are scattered calcifications in the carotid siphons but no hyperdense central vessels. Skull: The calvarium  and skull base are intact without focal lesion. Sinuses/Orbits: There is a chronic depressed fracture of the posterior aspect of the medial wall left orbit, without extraocular muscle entrapment. Sinuses and mastoid air cells are clear with right-sided deviation and spurring of the nasal septum. Other: There are scattered dural calcifications in the falx. There cutaneous calcifications in the frontal scalp. IMPRESSION: Small chronic right parietal lobe infarct. No acute intracranial CT findings. Obtain MRI if there is concern for occult infarct. Electronically Signed   By: Almira Bar M.D.   On: 02/18/2021 23:54    Procedures Procedures   Medications Ordered in ED Medications  metoCLOPramide (REGLAN) injection 10 mg (10 mg Intravenous Given 02/19/21 0055)  magnesium sulfate IVPB 2 g 50 mL (0 g Intravenous Stopped 02/19/21 0444)    ED Course  I have reviewed the triage vital signs and the nursing notes.  Pertinent labs & imaging results that were available during my care of the patient were reviewed by me and considered in my medical decision making (see chart for details).    MDM Rules/Calculators/A&P                           patient with remote history of CVA here for evaluation of headache, numbness and weakness. He is non-toxic appearing on evaluation and well perfused. Presentation is not consistent with subarachnoid hemorrhage. He does have left upper extremity weakness on examination. He does state this is new compared to his baseline. He is not of the TPA window due to duration of symptoms. Imaging demonstrates chronic infarct, no acute abnormality. He was treated with medications for his headache and his headache did partially improved but his symptoms of weakness persisted. His chest discomfort did improve with medications as well. Presentation is not consistent with ACS, PE, dissection. Given persistent neurologic symptoms discussed with neurologist Dr. Amada Jupiter. He recommends admission for further workup for possible CVA. Patient updated findings of studies recommendation for further workup and he is in agreement with plan. Hospitalist consulted for admission.  Final Clinical Impression(s) / ED Diagnoses Final diagnoses:  None    Rx / DC Orders ED Discharge Orders     None        Tilden Fossa, MD 02/19/21 [id number]

## 2021-02-19 ENCOUNTER — Observation Stay (HOSPITAL_COMMUNITY): Payer: Medicaid Other

## 2021-02-19 DIAGNOSIS — G43911 Migraine, unspecified, intractable, with status migrainosus: Secondary | ICD-10-CM

## 2021-02-19 DIAGNOSIS — Z720 Tobacco use: Secondary | ICD-10-CM

## 2021-02-19 DIAGNOSIS — E669 Obesity, unspecified: Secondary | ICD-10-CM | POA: Diagnosis present

## 2021-02-19 DIAGNOSIS — R079 Chest pain, unspecified: Secondary | ICD-10-CM | POA: Diagnosis not present

## 2021-02-19 DIAGNOSIS — G43919 Migraine, unspecified, intractable, without status migrainosus: Secondary | ICD-10-CM | POA: Diagnosis present

## 2021-02-19 DIAGNOSIS — Z8673 Personal history of transient ischemic attack (TIA), and cerebral infarction without residual deficits: Secondary | ICD-10-CM

## 2021-02-19 DIAGNOSIS — R202 Paresthesia of skin: Secondary | ICD-10-CM

## 2021-02-19 LAB — RESP PANEL BY RT-PCR (FLU A&B, COVID) ARPGX2
Influenza A by PCR: NEGATIVE
Influenza B by PCR: NEGATIVE
SARS Coronavirus 2 by RT PCR: NEGATIVE

## 2021-02-19 LAB — LIPID PANEL
Cholesterol: 142 mg/dL (ref 0–200)
HDL: 38 mg/dL — ABNORMAL LOW (ref >40)
LDL Cholesterol: 87 mg/dL (ref 0–99)
Total CHOL/HDL Ratio: 3.7 RATIO
Triglycerides: 86 mg/dL (ref <150)
VLDL: 17 mg/dL (ref 0–40)

## 2021-02-19 LAB — URINALYSIS, MICROSCOPIC (REFLEX)

## 2021-02-19 LAB — TROPONIN I (HIGH SENSITIVITY)
Troponin I (High Sensitivity): 7 ng/L (ref <18)
Troponin I (High Sensitivity): 5 ng/L (ref <18)

## 2021-02-19 LAB — HEMOGLOBIN A1C
Hgb A1c MFr Bld: 5.8 % — ABNORMAL HIGH (ref 4.8–5.6)
Mean Plasma Glucose: 119.76 mg/dL

## 2021-02-19 LAB — URINALYSIS, ROUTINE W REFLEX MICROSCOPIC
Glucose, UA: NEGATIVE mg/dL
Hgb urine dipstick: NEGATIVE
Ketones, ur: 40 mg/dL — AB
Leukocytes,Ua: NEGATIVE
Nitrite: NEGATIVE
Protein, ur: 30 mg/dL — AB
Specific Gravity, Urine: >=1.03 (ref 1.005–1.030)
pH: 5.5 (ref 5.0–8.0)

## 2021-02-19 LAB — TSH: TSH: 2.737 u[IU]/mL (ref 0.350–4.500)

## 2021-02-19 LAB — HIV ANTIBODY (ROUTINE TESTING W REFLEX): HIV Screen 4th Generation wRfx: NONREACTIVE

## 2021-02-19 MED ORDER — ACETAMINOPHEN 325 MG PO TABS: 650.0000 mg | ORAL_TABLET | ORAL | Status: DC | PRN

## 2021-02-19 MED ORDER — ACETAMINOPHEN 160 MG/5ML PO SOLN: 650.0000 mg | ORAL | Status: DC | PRN

## 2021-02-19 MED ORDER — KETOROLAC TROMETHAMINE 15 MG/ML IJ SOLN
15.0000 mg | Freq: Once | INTRAMUSCULAR | Status: AC
  Administered 2021-02-19: 15 mg via INTRAVENOUS
  Filled 2021-02-19: qty 1

## 2021-02-19 MED ORDER — BUTALBITAL-APAP-CAFFEINE 50-325-40 MG PO TABS
1.0000 | ORAL_TABLET | Freq: Four times a day (QID) | ORAL | Status: DC | PRN
  Filled 2021-02-19: qty 1

## 2021-02-19 MED ORDER — METOCLOPRAMIDE HCL 5 MG/ML IJ SOLN
10.0000 mg | Freq: Once | INTRAMUSCULAR | Status: AC
  Administered 2021-02-19: 10 mg via INTRAVENOUS
  Filled 2021-02-19: qty 2

## 2021-02-19 MED ORDER — ENOXAPARIN SODIUM 40 MG/0.4ML IJ SOSY
40.0000 mg | PREFILLED_SYRINGE | INTRAMUSCULAR | Status: DC
  Administered 2021-02-19: 40 mg via SUBCUTANEOUS
  Filled 2021-02-19: qty 0.4

## 2021-02-19 MED ORDER — MAGNESIUM SULFATE 2 GM/50ML IV SOLN
2.0000 g | Freq: Once | INTRAVENOUS | Status: AC
  Administered 2021-02-19: 2 g via INTRAVENOUS
  Filled 2021-02-19: qty 50

## 2021-02-19 MED ORDER — SUMATRIPTAN SUCCINATE 50 MG PO TABS
50.0000 mg | ORAL_TABLET | Freq: Once | ORAL | Status: DC
  Filled 2021-02-19: qty 1

## 2021-02-19 MED ORDER — DIPHENHYDRAMINE HCL 50 MG/ML IJ SOLN
25.0000 mg | Freq: Four times a day (QID) | INTRAMUSCULAR | Status: DC
  Administered 2021-02-20 (×2): 25 mg via INTRAVENOUS
  Filled 2021-02-19 (×2): qty 1

## 2021-02-19 MED ORDER — KETOROLAC TROMETHAMINE 15 MG/ML IJ SOLN
15.0000 mg | Freq: Four times a day (QID) | INTRAMUSCULAR | Status: DC
  Administered 2021-02-20 (×2): 15 mg via INTRAVENOUS
  Filled 2021-02-19 (×2): qty 1

## 2021-02-19 MED ORDER — NICOTINE 14 MG/24HR TD PT24
14.0000 mg | MEDICATED_PATCH | Freq: Every day | TRANSDERMAL | Status: DC | PRN
  Filled 2021-02-19: qty 1

## 2021-02-19 MED ORDER — SUMATRIPTAN SUCCINATE 50 MG PO TABS
50.0000 mg | ORAL_TABLET | ORAL | Status: DC | PRN
  Administered 2021-02-20 (×2): 50 mg via ORAL
  Filled 2021-02-19 (×3): qty 1

## 2021-02-19 MED ORDER — SODIUM CHLORIDE 0.9 % IV SOLN: Freq: Once | INTRAVENOUS | Status: AC

## 2021-02-19 MED ORDER — MAGNESIUM SULFATE IN D5W 1-5 GM/100ML-% IV SOLN
1.0000 g | Freq: Two times a day (BID) | INTRAVENOUS | Status: DC
  Administered 2021-02-19: 1 g via INTRAVENOUS
  Filled 2021-02-19 (×3): qty 100

## 2021-02-19 MED ORDER — DIPHENHYDRAMINE HCL 50 MG/ML IJ SOLN
25.0000 mg | Freq: Once | INTRAMUSCULAR | Status: AC
  Administered 2021-02-19: 25 mg via INTRAVENOUS
  Filled 2021-02-19: qty 1

## 2021-02-19 MED ORDER — ACETAMINOPHEN 650 MG RE SUPP: 650.0000 mg | RECTAL | Status: DC | PRN

## 2021-02-19 MED ORDER — STROKE: EARLY STAGES OF RECOVERY BOOK
Freq: Once | Status: AC
  Filled 2021-02-19: qty 1

## 2021-02-19 MED ORDER — METOCLOPRAMIDE HCL 5 MG/ML IJ SOLN
5.0000 mg | Freq: Four times a day (QID) | INTRAMUSCULAR | Status: AC
  Administered 2021-02-20 (×2): 5 mg via INTRAVENOUS
  Filled 2021-02-19 (×3): qty 2

## 2021-02-19 NOTE — ED Notes (Signed)
Pt provided with peanut butter crackers, graham crackers, oatmeal, milk, and pitcher of ice water. Denies further needs at this time.

## 2021-02-19 NOTE — ED Notes (Signed)
Report received from [place], [place]. All questions answered. Pt resting quietly in bed with eyes closed. Respirations even and unlabored. Bed in lowest position and locked with bed rail up X 1. No S/S of distress noted.

## 2021-02-19 NOTE — ED Notes (Signed)
Pt ambulated to BR with steady gait.

## 2021-02-19 NOTE — ED Notes (Signed)
Pt provided with meat loaf meal and ginger ale. Pt denies further needs at this time.

## 2021-02-19 NOTE — ED Notes (Signed)
Attempted to call report to RN on 3W at [facility]. Per [employer] operational officer, RN not available for report yet. Call back number left with secretary.

## 2021-02-19 NOTE — Progress Notes (Signed)
Patient: Esgar, Barnick  DOB: 02-26-2066 ZYY:[id number]  Transferring facility: [facility] Requesting provider: Dr. Madilyn Hook (EDP at [facility]) Reason for transfer: admission for further evaluation and management of suspected acute ischemic stroke.    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  Exam at [facility] reportedly suggestive of objective LUL weakness, diminished left upper extremity grip strength, positive pronator drift on left, with these deficits reportedly still present at the time of my discussion with EDP.  CT head negative.  COVID screen ordered, with result currently pending.  case/imaging were discussed with on-call neurology, Dr. Amada Jupiter, who recommended admission to the hospitalist service with neurology consultation for further stroke work-up. Aspirin administered at [facility] ed.   Subsequently, I accepted this patient for transfer for observation to a med-tele bed at [facility] for further work-up and management of suspected acute ischemic CVA.     Newton Pigg, DO Hospitalist

## 2021-02-19 NOTE — Plan of Care (Signed)
Discussed briefly with primary team and canceled full consult at their request.  They feel his symptoms are consistent with prior complex migraines as reported by the patient and documented in outside neurology notes.  Advised migraine cocktails, avoidance of triptans and completion of MRI brain.  Please do reach out to neurology if there is evidence of acute stroke on MRI brain and initiate stroke work-up  Brooke Dare MD-PhD [facility] [phone]  Available 7 AM to 7 PM, outside these hours please contact Neurologist on call listed on AMION

## 2021-02-19 NOTE — Plan of Care (Signed)

## 2021-02-19 NOTE — H&P (Signed)
History and Physical    Casey Morris OMV:[id number] DOB: April 04, 1966 DOA: 02/18/2021  Referring MD/NP/PA: Newton Pigg, DO PCP: Pcp, No  Patient coming from: [facility] transfer  Chief Complaint: Headache and left arm numbness  I have personally briefly reviewed patient's old medical records in [place] [facility]   HPI: Casey Morris is a 55 y.o. male with medical history significant of CVA without residual deficit, asthma, and tobacco abuse who presented with complaints of headache and left arm numbness.  He recalls waking up yesterday morning with a slight headache around 7 AM.  While he was at work at around 11 AM patient reported having severe global headache.  Reported associated symptoms of numbness from his left shoulder down to his fingertips, photophobia, blurry vision, chest pain and nausea.  Symptoms persisted for 12 hours until getting to the emergency department.  He did admit that he has a history of migraine headaches before and normally can take Excedrin Migraine with improvement in symptoms.  He had recently moved from [place] to [place] approximately 4 months ago.  When he lived in [place] he had had to go to the emergency department on at least more than 1 occasion with severe symptoms not controlled with Excedrin where he had similar numbness symptoms in his left arm.  ED Course: Admission into the emergency department patient was seen to be afebrile, pulse 65-105, respiration 15-22, and all other vital signs maintained.  CT scan of the head noted a small chronic right parietal lobe infarct with no acute intracranial CT findings.  He was not a tPA candidate due to duration of symptoms.  Labs significant for low MCV, low MCH, and high-sensitivity troponins negative x2.  Chest x-ray noted central bronchial thickening and mild hyperinflation mention suggestive of bronchitis or asthma.  Urinalysis was noted to be concentrated with ketones, and protein present.  Patient had been given 2 g  of magnesium sulfate and Reglan 10 mg IV.  The initial medicines took his headache from a 10 to a 5 on the pain scale.  Case had been discussed with neurology who recommended admission for completion of stroke work-up.  Review of Systems  Constitutional:  Negative for fever.  Eyes:  Positive for blurred vision.  Cardiovascular:  Positive for chest pain. Negative for leg swelling.  Gastrointestinal:  Positive for nausea. Negative for abdominal pain and vomiting.  Neurological:  Positive for sensory change. Negative for loss of consciousness.   Past Medical History:  Diagnosis Date   Asthma    Stroke [facility])     Past Surgical History:  Procedure Laterality Date   CARDIAC CATHETERIZATION       reports that he has been smoking cigarettes. He has never used smokeless tobacco. He reports current alcohol use. He reports current drug use. Drug: Marijuana.  Allergies  Allergen Reactions   Iodinated Diagnostic Agents Hives    No family history on file.  Prior to Admission medications   Not on File    Physical Exam:  Constitutional: Middle-age male who appears to be in some distress Vitals:   02/19/21 1230 02/19/21 1300 02/19/21 1515 02/19/21 1608  BP: 108/73 124/78 105/73 113/75  Pulse: 74 69 78 72  Resp: (!) 21 19 15    Temp:  98.5 F (36.9 C)  98.6 F (37 C)  TempSrc:  Oral    SpO2: 97% 96% 98% 96%  Weight:      Height:       Eyes: PERRL, lids and conjunctivae normal  ENMT: Mucous membranes are moist. Posterior pharynx clear of any exudate or lesions. Poor dentition Neck: normal, supple, no masses, no thyromegaly Respiratory: clear to auscultation bilaterally, no wheezing, no crackles. Normal respiratory effort. No accessory muscle use.  Cardiovascular: Regular rate and rhythm, no murmurs / rubs / gallops. No extremity edema. 2+ pedal pulses. No carotid bruits.  Abdomen: no tenderness, no masses palpated. No hepatosplenomegaly. Bowel sounds positive.  Musculoskeletal: no  clubbing / cyanosis. No joint deformity upper and lower extremities. Good ROM, no contractures. Normal muscle tone.  Skin: no rashes, lesions, ulcers. No induration Neurologic: CN 2-12 grossly intact. Sensation intact, DTR normal. Strength 5/5 in all 4.  Psychiatric: Normal judgment and insight. Alert and oriented x 3. Normal mood.     Labs on Admission: I have personally reviewed following labs and imaging studies  CBC: Recent Labs  Lab 02/18/21 2327  WBC 9.0  NEUTROABS 5.3  HGB 14.3  HCT 44.4  MCV 73.3*  PLT 312   Basic Metabolic Panel: Recent Labs  Lab 02/18/21 2327  NA 139  K 3.6  CL 106  CO2 25  GLUCOSE 107*  BUN 17  CREATININE 1.04  CALCIUM 9.3   GFR: Estimated Creatinine Clearance: 104.5 mL/min (by C-G formula based on SCr of 1.04 mg/dL). Liver Function Tests: Recent Labs  Lab 02/18/21 2327  AST 30  ALT 20  ALKPHOS 87  BILITOT 0.8  PROT 8.3*  ALBUMIN 4.6   No results for input(s): LIPASE, AMYLASE in the last 168 hours. No results for input(s): AMMONIA in the last 168 hours. Coagulation Profile: Recent Labs  Lab 02/18/21 2327  INR 1.0   Cardiac Enzymes: No results for input(s): CKTOTAL, CKMB, CKMBINDEX, TROPONINI in the last 168 hours. BNP (last 3 results) No results for input(s): PROBNP in the last 8760 hours. HbA1C: No results for input(s): HGBA1C in the last 72 hours. CBG: Recent Labs  Lab 02/18/21 2324  GLUCAP 106*   Lipid Profile: No results for input(s): CHOL, HDL, LDLCALC, TRIG, CHOLHDL, LDLDIRECT in the last 72 hours. Thyroid Function Tests: No results for input(s): TSH, T4TOTAL, FREET4, T3FREE, THYROIDAB in the last 72 hours. Anemia Panel: No results for input(s): VITAMINB12, FOLATE, FERRITIN, TIBC, IRON, RETICCTPCT in the last 72 hours. Urine analysis:    Component Value Date/Time   COLORURINE YELLOW 02/19/2021 0247   APPEARANCEUR HAZY (A) 02/19/2021 0247   LABSPEC >=1.030 02/19/2021 0247   PHURINE 5.5 02/19/2021 0247    GLUCOSEU NEGATIVE 02/19/2021 0247   HGBUR NEGATIVE 02/19/2021 0247   BILIRUBINUR SMALL (A) 02/19/2021 0247   KETONESUR 40 (A) 02/19/2021 0247   PROTEINUR 30 (A) 02/19/2021 0247   NITRITE NEGATIVE 02/19/2021 0247   LEUKOCYTESUR NEGATIVE 02/19/2021 0247   Sepsis Labs: Recent Results (from the past 240 hour(s))  Resp Panel by RT-PCR (Flu A&B, Covid) Nasopharyngeal Swab     Status: None   Collection Time: 02/19/21  3:45 AM   Specimen: Nasopharyngeal Swab; Nasopharyngeal(NP) swabs in vial transport medium  Result Value Ref Range Status   SARS Coronavirus 2 by RT PCR NEGATIVE NEGATIVE Final    Comment: (NOTE) SARS-CoV-2 target nucleic acids are NOT DETECTED.  The SARS-CoV-2 RNA is generally detectable in upper respiratory specimens during the acute phase of infection. The lowest concentration of SARS-CoV-2 viral copies this assay can detect is 138 copies/mL. A negative result does not preclude SARS-Cov-2 infection and should not be used as the sole basis for treatment or other patient management decisions. A negative result may  occur with  improper specimen collection/handling, submission of specimen other than nasopharyngeal swab, presence of viral mutation(s) within the areas targeted by this assay, and inadequate number of viral copies(<138 copies/mL). A negative result must be combined with clinical observations, patient history, and epidemiological information. The expected result is Negative.  Fact Sheet for Patients:  BloggerCourse.com  Fact Sheet for Healthcare Providers:  SeriousBroker.it  This test is no t yet approved or cleared by the [place] FDA and  has been authorized for detection and/or diagnosis of SARS-CoV-2 by FDA under an Emergency Use Authorization (EUA). This EUA will remain  in effect (meaning this test can be used) for the duration of the COVID-19 declaration under Section 564(b)(1) of the Act,  21 U.S.C.section 360bbb-3(b)(1), unless the authorization is terminated  or revoked sooner.       Influenza A by PCR NEGATIVE NEGATIVE Final   Influenza B by PCR NEGATIVE NEGATIVE Final    Comment: (NOTE) The Xpert Xpress SARS-CoV-2/FLU/RSV plus assay is intended as an aid in the diagnosis of influenza from Nasopharyngeal swab specimens and should not be used as a sole basis for treatment. Nasal washings and aspirates are unacceptable for Xpert Xpress SARS-CoV-2/FLU/RSV testing.  Fact Sheet for Patients: BloggerCourse.com  Fact Sheet for Healthcare Providers: SeriousBroker.it  This test is not yet approved or cleared by the [place] FDA and has been authorized for detection and/or diagnosis of SARS-CoV-2 by FDA under an Emergency Use Authorization (EUA). This EUA will remain in effect (meaning this test can be used) for the duration of the COVID-19 declaration under Section 564(b)(1) of the Act, 21 U.S.C. section 360bbb-3(b)(1), unless the authorization is terminated or revoked.  Performed at [facility], [address] [place], [place] [address]      Radiological Exams on Admission: DG Chest 2 View  Result Date: 02/18/2021 CLINICAL DATA:  Chest pain and headache.  Blurry vision. EXAM: CHEST - 2 VIEW COMPARISON:  None. FINDINGS: The cardiomediastinal contours are normal. There is biapical pleuroparenchymal scarring and blebs. Mild hyperinflation. Central bronchial thickening. Pulmonary vasculature is normal. No consolidation, pleural effusion, or pneumothorax. No acute osseous abnormalities are seen. IMPRESSION: Central bronchial thickening and mild hyperinflation suggesting bronchitis or asthma. Electronically Signed   By: Narda Rutherford M.D.   On: 02/18/2021 23:57   CT Head Wo Contrast  Result Date: 02/18/2021 CLINICAL DATA:  Left upper extremity numbness and headache. EXAM: CT HEAD WITHOUT CONTRAST  TECHNIQUE: Contiguous axial images were obtained from the base of the skull through the vertex without intravenous contrast. COMPARISON:  None. FINDINGS: Brain: There is a 2.5 x 2.2 x 2.2 cm focal encephalomalacia consistent with a chronic cortical infarct in the lower lateral right parietal lobe. There is early cerebral cortical atrophy without significant findings of small vessel disease. Cerebellum and brainstem are unremarkable with no focal asymmetry concerning for acute infarct, hemorrhage or mass. Ventricles are normal in size and position. Vascular: There are scattered calcifications in the carotid siphons but no hyperdense central vessels. Skull: The calvarium and skull base are intact without focal lesion. Sinuses/Orbits: There is a chronic depressed fracture of the posterior aspect of the medial wall left orbit, without extraocular muscle entrapment. Sinuses and mastoid air cells are clear with right-sided deviation and spurring of the nasal septum. Other: There are scattered dural calcifications in the falx. There cutaneous calcifications in the frontal scalp. IMPRESSION: Small chronic right parietal lobe infarct. No acute intracranial CT findings. Obtain MRI if there is  concern for occult infarct. Electronically Signed   By: Almira Bar M.D.   On: 02/18/2021 23:54    EKG: Independently reviewed.  Sinus tachycardia with occasional premature ventricular complexes at 109 bpm  Assessment/Plan  Paresthesias: Patient reports having left-sided numbness and tingling sensation.  CT scan of the head negative for any acute abnormalities and noted old small chronic right parietal lobe infarct.  Symptoms were questioned to possibly be secondary to a acute stroke versus complex migraine. -Admit to medical telemetry  -Neurochecks -Check UDS -Check MRI of the brain -Consider formal neurology consultation and completion of stroke work-up if MRI is concerning for an acute stroke  Intractable migraine  headache: Patient reports having intractable global headache that he rates as severe.  Case discussed with Dr. Iver Nestle of neurology who recommended scheduling headache cocktail until symptoms resolved -IV Benadryl, Reglan, and Toradol scheduled every 6 hours  Chest pain: Resolved.  Patient complained of having some left-sided chest pain yesterday with symptoms started.  However, EKG without significant ischemic changes and high-sensitivity troponin negative x2.  Chest x-ray noted mild hyperinflation with bronchitic changes concerning for asthma versus bronchitis. -Continue to monitor and consider need of further work-up if warranted  History of CVA: Patient suffered right parietal lobe infarct back in 2010, but denied any residual deficits as result of the stroke.  Tobacco abuse: Patient reports smoking less than half pack cigarettes per day on average -Nicotine patch offered  Obesity: BMI 30 kg/m for  DVT prophylaxis: Lovenox Code Status: Full Family Communication: None requested Disposition Plan: Hopefully discharge home once medically stable Consults called: None Admission status: Observation  Clydie Braun MD [facility]   If 7PM-7AM, please contact night-coverage   02/19/2021, 4:16 PM

## 2021-02-19 NOTE — ED Notes (Signed)
Pt ate 100% breakfast with no issue. Pt denies needs at this time.

## 2021-02-20 MED ORDER — IBUPROFEN 600 MG PO TABS: 600.0000 mg | ORAL_TABLET | Freq: Three times a day (TID) | ORAL | 1 refills | Status: DC | PRN

## 2021-02-20 MED ORDER — SUMATRIPTAN SUCCINATE 50 MG PO TABS: 50.0000 mg | ORAL_TABLET | ORAL | 0 refills | Status: DC | PRN

## 2021-02-20 NOTE — Progress Notes (Signed)
SLP Cancellation Note  Patient Details Name: [name] MRN: [id number] DOB: 1966-02-02   Cancelled treatment:       Reason Eval/Treat Not Completed: SLP screened, no needs identified, will sign off.    Avie Echevaria, MA, CCC-SLP Acute Rehabilitation Services Office Number: [phone]  Paulette Blanch 02/20/2021, 2:47 PM

## 2021-02-20 NOTE — Care Management (Signed)
Patient provided with Good RX coupon for medications at DC. No other TOC needs identified.
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
## 2021-02-20 NOTE — Plan of Care (Signed)
Pt is alert oriented x 4. Pt c.o left hand numbness and tingling and some blurred vision. Pt is ambulatory, stand by assist. No distress noted. VS within normal limits. Pt c/o head ache, scheduled medications given, pt sleeping but when waking up headache is present.   Problem: Education: Goal: Knowledge of General Education information will improve Description: Including pain rating scale, medication(s)/side effects and non-pharmacologic comfort measures Outcome: Progressing   Problem: Health Behavior/Discharge Planning: Goal: Ability to manage health-related needs will improve Outcome: Progressing   Problem: Clinical Measurements: Goal: Ability to maintain clinical measurements within normal limits will improve Outcome: Progressing Goal: Will remain free from infection Outcome: Progressing Goal: Diagnostic test results will improve Outcome: Progressing Goal: Respiratory complications will improve Outcome: Progressing Goal: Cardiovascular complication will be avoided Outcome: Progressing   Problem: Activity: Goal: Risk for activity intolerance will decrease Outcome: Progressing   Problem: Nutrition: Goal: Adequate nutrition will be maintained Outcome: Progressing   Problem: Coping: Goal: Level of anxiety will decrease Outcome: Progressing   Problem: Elimination: Goal: Will not experience complications related to bowel motility Outcome: Progressing Goal: Will not experience complications related to urinary retention Outcome: Progressing   Problem: Pain Managment: Goal: General experience of comfort will improve Outcome: Progressing   Problem: Safety: Goal: Ability to remain free from injury will improve Outcome: Progressing   Problem: Skin Integrity: Goal: Risk for impaired skin integrity will decrease Outcome: Progressing

## 2021-12-15 ENCOUNTER — Emergency Department (HOSPITAL_COMMUNITY)
Admission: EM | Admit: 2021-12-15 | Discharge: 2021-12-16 | Disposition: A | Discharge status: Home / Self Care | Payer: Medicaid Other | Attending: Emergency Medicine | Admitting: Emergency Medicine

## 2021-12-15 ENCOUNTER — Encounter (HOSPITAL_COMMUNITY): Payer: Self-pay | Admitting: Emergency Medicine

## 2021-12-15 ENCOUNTER — Other Ambulatory Visit: Payer: Self-pay

## 2021-12-15 ENCOUNTER — Emergency Department (HOSPITAL_COMMUNITY): Payer: Medicaid Other

## 2021-12-15 DIAGNOSIS — R45851 Suicidal ideations: Secondary | ICD-10-CM | POA: Insufficient documentation

## 2021-12-15 DIAGNOSIS — R202 Paresthesia of skin: Secondary | ICD-10-CM | POA: Insufficient documentation

## 2021-12-15 DIAGNOSIS — F339 Major depressive disorder, recurrent, unspecified: Secondary | ICD-10-CM | POA: Insufficient documentation

## 2021-12-15 DIAGNOSIS — F321 Major depressive disorder, single episode, moderate: Secondary | ICD-10-CM

## 2021-12-15 DIAGNOSIS — Z20822 Contact with and (suspected) exposure to covid-19: Secondary | ICD-10-CM | POA: Insufficient documentation

## 2021-12-15 DIAGNOSIS — R519 Headache, unspecified: Secondary | ICD-10-CM | POA: Insufficient documentation

## 2021-12-15 LAB — CBC WITH DIFFERENTIAL/PLATELET
Abs Immature Granulocytes: 0.01 10*3/uL (ref 0.00–0.07)
Basophils Absolute: 0 10*3/uL (ref 0.0–0.1)
Basophils Relative: 1 %
Eosinophils Absolute: 0 10*3/uL (ref 0.0–0.5)
Eosinophils Relative: 0 %
HCT: 40.7 % (ref 39.0–52.0)
Hemoglobin: 13.4 g/dL (ref 13.0–17.0)
Immature Granulocytes: 0 %
Lymphocytes Relative: 30 %
Lymphs Abs: 2.1 10*3/uL (ref 0.7–4.0)
MCH: 23.9 pg — ABNORMAL LOW (ref 26.0–34.0)
MCHC: 32.9 g/dL (ref 30.0–36.0)
MCV: 72.7 fL — ABNORMAL LOW (ref 80.0–100.0)
Monocytes Absolute: 0.8 10*3/uL (ref 0.1–1.0)
Monocytes Relative: 11 %
Neutro Abs: 4 10*3/uL (ref 1.7–7.7)
Neutrophils Relative %: 58 %
Platelets: 252 10*3/uL (ref 150–400)
RBC: 5.6 MIL/uL (ref 4.22–5.81)
RDW: 16.8 % — ABNORMAL HIGH (ref 11.5–15.5)
WBC: 7 10*3/uL (ref 4.0–10.5)
nRBC: 0 % (ref 0.0–0.2)

## 2021-12-15 LAB — RAPID URINE DRUG SCREEN, HOSP PERFORMED
Amphetamines: NOT DETECTED
Barbiturates: NOT DETECTED
Benzodiazepines: NOT DETECTED
Cocaine: POSITIVE — AB
Opiates: NOT DETECTED
Tetrahydrocannabinol: NOT DETECTED

## 2021-12-15 LAB — COMPREHENSIVE METABOLIC PANEL
ALT: 20 U/L (ref 0–44)
AST: 26 U/L (ref 15–41)
Albumin: 4.3 g/dL (ref 3.5–5.0)
Alkaline Phosphatase: 90 U/L (ref 38–126)
Anion gap: 7 (ref 5–15)
BUN: 21 mg/dL — ABNORMAL HIGH (ref 6–20)
CO2: 24 mmol/L (ref 22–32)
Calcium: 9.1 mg/dL (ref 8.9–10.3)
Chloride: 110 mmol/L (ref 98–111)
Creatinine, Ser: 0.8 mg/dL (ref 0.61–1.24)
GFR, Estimated: >60 mL/min (ref >60)
Glucose, Bld: 102 mg/dL — ABNORMAL HIGH (ref 70–99)
Potassium: 3.6 mmol/L (ref 3.5–5.1)
Sodium: 141 mmol/L (ref 135–145)
Total Bilirubin: 0.7 mg/dL (ref 0.3–1.2)
Total Protein: 7.7 g/dL (ref 6.5–8.1)

## 2021-12-15 LAB — TROPONIN I (HIGH SENSITIVITY)
Troponin I (High Sensitivity): 9 ng/L (ref <18)
Troponin I (High Sensitivity): 12 ng/L (ref <18)

## 2021-12-15 LAB — RESP PANEL BY RT-PCR (FLU A&B, COVID) ARPGX2
Influenza A by PCR: NEGATIVE
Influenza B by PCR: NEGATIVE
SARS Coronavirus 2 by RT PCR: NEGATIVE

## 2021-12-15 LAB — ETHANOL: Alcohol, Ethyl (B): <10 mg/dL (ref <10)

## 2021-12-15 MED ORDER — ALUM & MAG HYDROXIDE-SIMETH 200-200-20 MG/5ML PO SUSP: 30.0000 mL | Freq: Four times a day (QID) | ORAL | Status: DC | PRN

## 2021-12-15 MED ORDER — ZOLPIDEM TARTRATE 5 MG PO TABS: 5.0000 mg | ORAL_TABLET | Freq: Every evening | ORAL | Status: DC | PRN

## 2021-12-15 MED ORDER — ONDANSETRON HCL 4 MG PO TABS: 4.0000 mg | ORAL_TABLET | Freq: Three times a day (TID) | ORAL | Status: DC | PRN

## 2021-12-15 MED ORDER — ACETAMINOPHEN 325 MG PO TABS
650.0000 mg | ORAL_TABLET | ORAL | Status: DC | PRN
  Administered 2021-12-16: 650 mg via ORAL
  Filled 2021-12-15: qty 2

## 2021-12-15 NOTE — ED Provider Notes (Signed)
[facility] EMERGENCY DEPARTMENT Provider Note   CSN: [id number] Arrival date & time: 12/15/21  6712     History  Chief Complaint  Patient presents with   Headache   Numbness    Casey Morris is a 56 y.o. male.  Patient complains of numbness to his left arm.  He reports he became concerned because he had similar symptoms in the past when he had a CVA.  Patient reports he was concerned that something could be going on with his heart.  Patient reports he has a history of headaches he has a history of paresthesias on the left side he has had a CVA in the past.  Patient denies any fever he denies any cough he denies any congestion.  He denies any weakness in any extremities.  He has not had any vision changes or hearing loss patient denies any difficulty with speech.  Patient also states that he has had problems with depression and he has been having some suicidal thoughts that he would like to speak with someone about  The history is provided by the patient. No language interpreter was used.  Headache      Home Medications Prior to Admission medications   Medication Sig Start Date End Date Taking? Authorizing Provider  albuterol (VENTOLIN HFA) 108 (90 Base) MCG/ACT inhaler Inhale 2 puffs into the lungs every 4 (four) hours as needed for wheezing or shortness of breath. 06/15/19  Yes [provider]      Allergies    Iodides and Iodinated contrast media    Review of Systems   Review of Systems  Neurological:  Positive for headaches.  All other systems reviewed and are negative.   Physical Exam Updated Vital Signs BP (!) 137/93   Pulse 67   Temp 98.9 F (37.2 C) (Oral)   Resp 18   Ht 6\' 3"  (1.905 m)   Wt 102.1 kg   SpO2 98%   BMI 28.12 kg/m  Physical Exam Vitals and nursing note reviewed.  Constitutional:      Appearance: He is well-developed.  HENT:     Head: Normocephalic.     Mouth/Throat:     Mouth: Mucous membranes are moist.   Eyes:     Extraocular Movements: Extraocular movements intact.     Pupils: Pupils are equal, round, and reactive to light.  Cardiovascular:     Rate and Rhythm: Normal rate and regular rhythm.  Pulmonary:     Effort: Pulmonary effort is normal.  Abdominal:     General: There is no distension.  Musculoskeletal:        General: Normal range of motion.     Cervical back: Normal range of motion.  Skin:    General: Skin is warm.  Neurological:     General: No focal deficit present.     Mental Status: He is alert and oriented to person, place, and time.     Cranial Nerves: No cranial nerve deficit.     Sensory: No sensory deficit.     Motor: No weakness.  Psychiatric:        Mood and Affect: Mood is depressed.        Behavior: Behavior normal.     ED Results / Procedures / Treatments   Labs (all labs ordered are listed, but only abnormal results are displayed) Labs Reviewed  CBC WITH DIFFERENTIAL/PLATELET - Abnormal; Notable for the following components:      Result Value   MCV 72.7 (*)  MCH 23.9 (*)    RDW 16.8 (*)    All other components within normal limits  COMPREHENSIVE METABOLIC PANEL - Abnormal; Notable for the following components:   Glucose, Bld 102 (*)    BUN 21 (*)    All other components within normal limits  ETHANOL  RAPID URINE DRUG SCREEN, HOSP PERFORMED  TROPONIN I (HIGH SENSITIVITY)  TROPONIN I (HIGH SENSITIVITY)    EKG None  Radiology CT Head Wo Contrast  Result Date: 12/15/2021 CLINICAL DATA:  Headache. Left arm numbness beginning early this morning. EXAM: CT HEAD WITHOUT CONTRAST TECHNIQUE: Contiguous axial images were obtained from the base of the skull through the vertex without intravenous contrast. RADIATION DOSE REDUCTION: This exam was performed according to the departmental dose-optimization program which includes automated exposure control, adjustment of the mA and/or kV according to patient size and/or use of iterative reconstruction  technique. COMPARISON:  CT, 02/18/2021.  MRI, 02/19/2021. FINDINGS: Brain: No evidence of acute infarction, hemorrhage, hydrocephalus, extra-axial collection or mass lesion/mass effect. Old right anterior parietal lobe infarct, stable. Vascular: No hyperdense vessel or unexpected calcification. Skull: Normal. Negative for fracture or focal lesion. Sinuses/Orbits: Visualized globes and orbits are unremarkable. The visualized sinuses are clear. Other: None. IMPRESSION: 1. No acute intracranial abnormalities. 2. Small stable old right parietal lobe infarct. Electronically Signed   By: Amie Portland M.D.   On: 12/15/2021 10:04    Procedures Procedures    Medications Ordered in ED Medications - No data to display  ED Course/ Medical Decision Making/ A&P                           Medical Decision Making Patient complains of depression with suicidal thoughts as well as tingling in his left arm.  Patient become concerned today that he could be having a heart attack or stroke  Amount and/or Complexity of Data Reviewed External Data Reviewed: notes.    Details: Previous records reviewed.  Patient was treated at [facility] for a CVA 04/03/2021 Labs: ordered. Decision-making details documented in ED Course.    Details: Labs ordered reviewed and interpreted troponin is negative x2 Radiology: ordered.    Details: CT head is obtained and reviewed CT shows small stable old old right parietal lobe infarct no acute abnormality ECG/medicine tests: ordered and independent interpretation performed. Decision-making details documented in ED Course.    Details: EKG shows no acute changes Discussion of management or test interpretation with external provider(s): TTS consulted for evaluation for suicidal thoughts and depression  Risk OTC drugs. Prescription drug management. Risk Details: Medical decision making: I do not think patient is suffering a CVA his troponins are negative x2 and his EKG is normal making it  unlikely that he is having anything acute with his heart.  Patient is medically clear for TTS consult           Final Clinical Impression(s) / ED Diagnoses Final diagnoses:  Paresthesia  Suicidal ideation  Episode of recurrent major depressive disorder, unspecified depression episode severity [facility])    Rx / DC Orders ED Discharge Orders     None         Osie Cheeks 12/15/21 1528    Gloris Manchester, MD 12/15/21 [phone]

## 2021-12-15 NOTE — ED Triage Notes (Addendum)
Pt reports he was at work and began having a headache and left arm numbness around 7 am. Hx of stroke in 2010. Pt states he did not feel 100% yesterday and was bumping into things.

## 2021-12-15 NOTE — Consult Note (Signed)
BH ED ASSESSMENT   Reason for Consult:  SI Referring Physician:  Joylene Grapes Patient Identification: Casey Morris MRN:  [id number] ED Chief Complaint: Suicidal ideation  Diagnosis:  Principal Problem:   Suicidal ideation Active Problems:   Major depressive disorder, single episode, moderate (HCC)   ED Assessment Time Calculation: Start Time: 1700 Stop Time: 1730 Total Time in Minutes (Assessment Completion): 30   Subjective:   Mcclain Shall is a 56 y.o. male patient originally presented for concerns of having a stroke.  Patient has been medically cleared.  He reports depression and suicidal ideations that he would like to speak to someone about.  HPI:   Patient seen at [facility] ED for face-to-face evaluation.  He tells me he has had increased depression for the past few weeks.  He denies any previous psychiatric diagnosis.  Denies a history of depression.  He denies any previous psychotropic medications.  He denies any history of previous suicide attempts.  He does endorse current suicidal ideations, no plan or intent.  He denies any homicidal ideations.  Denies any auditory or visual hallucinations.  He tells me his suicidal ideations are stemmed from feeling like he is not where he should be at in life.  He recently moved to [place] from [place] 1 year ago to be closer to his daughter.  His ex-wife has custody of his daughter and they live in [place], [place].  He has struggled with employment and crack cocaine use.  He currently is at an [employer].  He declines needing further substance abuse treatment at this time.  He states he attends meetings and goes to church which helps him stay sober.  He states he felt very overwhelmed and stressed which caused his relapse last week.  He denies problems with sleep or appetite.  He does feel like his depression is due to his current situation of lack of employment, relapse of substances, and feeling like he cannot be the best father he can  be.  We spoke about possible medication management, however he declined at this time.  He would like to try therapy and outpatient follow-up prior to starting medications, which I am agreeable with.  He does not feel safe to discharge at this time, he is not able to contract for safety.  He feels like he will use substances and his suicidal ideations worsen.  He is afraid he might try and hurt himself.  I spoke with him about voluntary transfer to [facility] behavioral health urgent cares [facility].  He is agreeable with this plan.  I reached out to [facility] but have not heard back. Will hopefully plan on transfer to [facility], if patient is not accepted or there is no bed availability will recommend overnight observation with reevaluation by psychiatry in the morning.   Past Psychiatric History:  Polysubstance abuse, denies any formal psychiatric diagnosis.  However it appears patient presented to [facility] 09/02/2021 for suicidal ideations and substance abuse.  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  Risk to Self or Others: Is the patient at risk to self? Yes Has the patient been a risk to self in the past 6 months? No Has the patient been a risk to self within the distant past? No Is the patient a risk to others? No Has the patient been a  risk to others in the past 6 months? No Has the patient been a risk to others within the distant past? No  [place] Scale:  Flowsheet Row ED from 12/15/2021 in [facility] EMERGENCY DEPARTMENT ED to Hosp-Admission (Discharged) from 02/18/2021 in [facility]  C-SSRS RISK CATEGORY High Risk No Risk       Past Medical History:  Past Medical History:  Diagnosis Date   Asthma    Stroke [facility])     Past Surgical History:  Procedure Laterality Date   CARDIAC CATHETERIZATION      Family History: History reviewed. No pertinent family history.  Social History:  Social History   Substance and Sexual Activity  Alcohol Use Yes     Social History   Substance and Sexual Activity  Drug Use Yes   Types: Marijuana    Social History   Socioeconomic History   Marital status: Divorced    Spouse name: Not on file   Number of children: Not on file   Years of education: Not on file   Highest education level: Not on file  Occupational History   Not on file  Tobacco Use   Smoking status: Every Day    Types: Cigarettes   Smokeless tobacco: Never  Substance and Sexual Activity   Alcohol use: Yes   Drug use: Yes    Types: Marijuana   Sexual activity: Not on file  Other Topics Concern   Not on file  Social History Narrative   Not on file   Social Determinants of Health   Financial Resource Strain: Not on file  Food Insecurity: Not on file  Transportation Needs: Not on file  Physical Activity: Not on file  Stress: Not on file  Social Connections: Not on file   Additional Social History:    Allergies:   Allergies  Allergen Reactions   Iodides Hives   Iodinated Contrast Media Hives    Labs:  Results for orders placed or performed during the hospital encounter of 12/15/21 (from the past 48 hour(s))  CBC with Differential     Status: Abnormal   Collection Time: 12/15/21  9:24 AM  Result Value Ref Range   WBC 7.0 4.0 - 10.5 K/uL   RBC 5.60 4.22 - 5.81 MIL/uL   Hemoglobin 13.4 13.0 - 17.0 g/dL   HCT 36.6 29.4 - 76.5 %   MCV 72.7 (L) 80.0 - 100.0 fL   MCH 23.9 (L) 26.0 - 34.0 pg   MCHC 32.9 30.0 - 36.0 g/dL   RDW 46.5 (H) 03.5 - 46.5 %   Platelets 252 150 - 400 K/uL   nRBC 0.0 0.0 - 0.2 %   Neutrophils Relative % 58 %   Neutro Abs 4.0 1.7 - 7.7 K/uL   Lymphocytes Relative 30 %   Lymphs Abs 2.1 0.7 - 4.0 K/uL   Monocytes Relative 11 %   Monocytes Absolute 0.8 0.1 - 1.0 K/uL   Eosinophils Relative 0 %   Eosinophils Absolute 0.0 0.0 - 0.5  K/uL   Basophils Relative 1 %   Basophils Absolute 0.0 0.0 - 0.1 K/uL   Immature Granulocytes 0 %   Abs Immature Granulocytes 0.01 0.00 - 0.07 K/uL    Comment: Performed at [facility] Lab, [address] [place], [place] [address]  Comprehensive metabolic panel     Status: Abnormal   Collection Time: 12/15/21  9:24 AM  Result Value Ref Range   Sodium 141 135 - 145  mmol/L   Potassium 3.6 3.5 - 5.1 mmol/L   Chloride 110 98 - 111 mmol/L   CO2 24 22 - 32 mmol/L   Glucose, Bld 102 (H) 70 - 99 mg/dL    Comment: Glucose reference range applies only to samples taken after fasting for at least 8 hours.   BUN 21 (H) 6 - 20 mg/dL   Creatinine, Ser 5.18 0.61 - 1.24 mg/dL   Calcium 9.1 8.9 - 33.5 mg/dL   Total Protein 7.7 6.5 - 8.1 g/dL   Albumin 4.3 3.5 - 5.0 g/dL   AST 26 15 - 41 U/L   ALT 20 0 - 44 U/L   Alkaline Phosphatase 90 38 - 126 U/L   Total Bilirubin 0.7 0.3 - 1.2 mg/dL   GFR, Estimated >82 >51 mL/min    Comment: (NOTE) Calculated using the CKD-EPI Creatinine Equation (2021)    Anion gap 7 5 - 15    Comment: Performed at [facility], [address] [place], [place] [address]  Troponin I (High Sensitivity)     Status: None   Collection Time: 12/15/21  9:24 AM  Result Value Ref Range   Troponin I (High Sensitivity) 9 <18 ng/L    Comment: (NOTE) Elevated high sensitivity troponin I (hsTnI) values and significant  changes across serial measurements may suggest ACS but many other  chronic and acute conditions are known to elevate hsTnI results.  Refer to the "Links" section for chest pain algorithms and additional  guidance. Performed at [facility], [address] [place], [place] [address]   Troponin I (High Sensitivity)     Status: None   Collection Time: 12/15/21 12:47 PM  Result Value Ref Range   Troponin I (High Sensitivity) 12 <18 ng/L    Comment: (NOTE) Elevated high sensitivity troponin I (hsTnI) values and significant  changes across serial  measurements may suggest ACS but many other  chronic and acute conditions are known to elevate hsTnI results.  Refer to the "Links" section for chest pain algorithms and additional  guidance. Performed at [facility] Lab, [address] [place], [place] [address]   Ethanol     Status: None   Collection Time: 12/15/21 12:47 PM  Result Value Ref Range   Alcohol, Ethyl (B) <10 <10 mg/dL    Comment: (NOTE) Lowest detectable limit for serum alcohol is 10 mg/dL.  For medical purposes only. Performed at [facility] Lab, [address] [place], [place] [address]   Rapid urine drug screen (hospital performed)     Status: Abnormal   Collection Time: 12/15/21  2:09 PM  Result Value Ref Range   Opiates NONE DETECTED NONE DETECTED   Cocaine POSITIVE (A) NONE DETECTED   Benzodiazepines NONE DETECTED NONE DETECTED   Amphetamines NONE DETECTED NONE DETECTED   Tetrahydrocannabinol NONE DETECTED NONE DETECTED   Barbiturates NONE DETECTED NONE DETECTED    Comment: (NOTE) DRUG SCREEN FOR MEDICAL PURPOSES ONLY.  IF CONFIRMATION IS NEEDED FOR ANY PURPOSE, NOTIFY LAB WITHIN 5 DAYS.  LOWEST DETECTABLE LIMITS FOR URINE DRUG SCREEN Drug Class                     Cutoff (ng/mL) Amphetamine and metabolites    1000 Barbiturate and metabolites    200 Benzodiazepine                 200 Tricyclics and metabolites     300 Opiates and metabolites  300 Cocaine and metabolites        300 THC                            50 Performed at [facility] Lab, [place] [address], [place], [place] [address]   Resp Panel by RT-PCR (Flu A&B, Covid) Anterior Nasal Swab     Status: None   Collection Time: 12/15/21  3:09 PM   Specimen: Anterior Nasal Swab  Result Value Ref Range   SARS Coronavirus 2 by RT PCR NEGATIVE NEGATIVE    Comment: (NOTE) SARS-CoV-2 target nucleic acids are NOT DETECTED.  The SARS-CoV-2 RNA is generally detectable in upper respiratory specimens during the acute phase of  infection. The lowest concentration of SARS-CoV-2 viral copies this assay can detect is 138 copies/mL. A negative result does not preclude SARS-Cov-2 infection and should not be used as the sole basis for treatment or other patient management decisions. A negative result may occur with  improper specimen collection/handling, submission of specimen other than nasopharyngeal swab, presence of viral mutation(s) within the areas targeted by this assay, and inadequate number of viral copies(<138 copies/mL). A negative result must be combined with clinical observations, patient history, and epidemiological information. The expected result is Negative.  Fact Sheet for Patients:  EntrepreneurPulse.com.au  Fact Sheet for Healthcare Providers:  IncredibleEmployment.be  This test is no t yet approved or cleared by the [place] FDA and  has been authorized for detection and/or diagnosis of SARS-CoV-2 by FDA under an Emergency Use Authorization (EUA). This EUA will remain  in effect (meaning this test can be used) for the duration of the COVID-19 declaration under Section 564(b)(1) of the Act, 21 U.S.C.section 360bbb-3(b)(1), unless the authorization is terminated  or revoked sooner.       Influenza A by PCR NEGATIVE NEGATIVE   Influenza B by PCR NEGATIVE NEGATIVE    Comment: (NOTE) The Xpert Xpress SARS-CoV-2/FLU/RSV plus assay is intended as an aid in the diagnosis of influenza from Nasopharyngeal swab specimens and should not be used as a sole basis for treatment. Nasal washings and aspirates are unacceptable for Xpert Xpress SARS-CoV-2/FLU/RSV testing.  Fact Sheet for Patients: EntrepreneurPulse.com.au  Fact Sheet for Healthcare Providers: IncredibleEmployment.be  This test is not yet approved or cleared by the [place] FDA and has been authorized for detection and/or diagnosis of SARS-CoV-2 by FDA  under an Emergency Use Authorization (EUA). This EUA will remain in effect (meaning this test can be used) for the duration of the COVID-19 declaration under Section 564(b)(1) of the Act, 21 U.S.C. section 360bbb-3(b)(1), unless the authorization is terminated or revoked.  Performed at [facility], [place] [address], [place], [place] [address]     Current Facility-Administered Medications  Medication Dose Route Frequency Provider Last Rate Last Admin   acetaminophen (TYLENOL) tablet 650 mg  650 mg Oral Q4H PRN Caryl Ada K, PA-C       alum & mag hydroxide-simeth (MAALOX/MYLANTA) 200-200-20 MG/5ML suspension 30 mL  30 mL Oral Q6H PRN Sofia, Leslie K, PA-C       ondansetron [facility]) tablet 4 mg  4 mg Oral Q8H PRN Sofia, Leslie K, PA-C       zolpidem (AMBIEN) tablet 5 mg  5 mg Oral QHS PRN Fransico Meadow, PA-C       Current Outpatient Medications  Medication Sig Dispense Refill   albuterol (VENTOLIN HFA) 108 (90 Base) MCG/ACT inhaler Inhale  2 puffs into the lungs every 4 (four) hours as needed for wheezing or shortness of breath.     Psychiatric Specialty Exam: Presentation  General Appearance: Appropriate for Environment  Eye Contact:Good  Speech:Clear and Coherent  Speech Volume:Normal  Handedness:No data recorded  Mood and Affect  Mood:Depressed  Affect:Congruent   Thought Process  Thought Processes:Coherent  Descriptions of Associations:Intact  Orientation:Full (Time, Place and Person)  Thought Content:Logical  History of Schizophrenia/Schizoaffective disorder:No data recorded Duration of Psychotic Symptoms:No data recorded Hallucinations:Hallucinations: None  Ideas of Reference:None  Suicidal Thoughts:Suicidal Thoughts: Yes, Active SI Active Intent and/or Plan: Without Intent; Without Plan  Homicidal Thoughts:Homicidal Thoughts: No   Sensorium  Memory:Immediate Fair  Judgment:Fair  Insight:Fair   Executive Functions   Concentration:Fair  Attention Span:Fair  [place] of Knowledge:Good  Language:Good   Psychomotor Activity  Psychomotor Activity:Psychomotor Activity: Normal   Assets  Assets:Communication Skills; Desire for Improvement; Physical Health; Resilience    Sleep  Sleep:Sleep: Good   Physical Exam: Physical Exam Neurological:     Mental Status: He is alert and oriented to person, place, and time.    Review of Systems  Psychiatric/Behavioral:  Positive for depression, substance abuse and suicidal ideas.   All other systems reviewed and are negative.  Blood pressure (!) 137/93, pulse 67, temperature 98.9 F (37.2 C), temperature source Oral, resp. rate 18, height 6\' 3"  (1.905 m), weight 102.1 kg, SpO2 98 %. Body mass index is 28.12 kg/m.  Medical Decision Making: Patient case reviewed and discussed with Dr. Lovette Cliche.  Patient is unable to contract for safety at this time.  However there are concerns for secondary gain of housing and malingering.  He does not meet criteria for inpatient psychiatric admission at this time. Preferably will transfer patient to [facility] behavioral urgent care to their [facility].  However no response from [facility] staff at this time, if they do not accept or are at capacity will recommend overnight observation with reevaluation by psychiatry in the morning. EDP, RN, and LCSW notified of disposition.     Disposition: Patient does not meet criteria for psychiatric inpatient admission.  Vesta Mixer, NP 12/15/2021 5:34 PM

## 2021-12-15 NOTE — ED Notes (Signed)
Patient transported to CT 

## 2021-12-15 NOTE — ED Notes (Addendum)
Pt changed in into burgundy scrubs. Belongings removed from pt room and inventoried. Pt cell phone given to security. Belongings placed in locker #6

## 2021-12-16 NOTE — Discharge Summary (Signed)
[facility] ED Discharge  12/16/2021 11:25 AM Casey Morris  MRN:  [id number]  Principal Problem: Suicidal ideation Discharge Diagnoses: Principal Problem:   Suicidal ideation Active Problems:   Major depressive disorder, single episode, moderate (HCC)  Clinical Impression:  Final diagnoses:  Paresthesia  Suicidal ideation  Episode of recurrent major depressive disorder, unspecified depression episode severity (HCC)   Subjective:  Patient seen in his room at [facility] ED for face-to-face reevaluation.  He tells me he is feeling much better today and is requesting discharge.  He tells me he thought a lot about our conversation yesterday, and he does feel like when he starts making life improvements such as staying sober, getting a job, and being able to see his daughter more that he will feel much happier.  He denies any current suicidal ideations.  He tells me he would not want to do that to his family, specifically his daughter.  He talks about her and how he wants to be there for her future.  He denies any homicidal ideations.  Denies any auditory or visual hallucinations.  Denies problems with sleep or appetite.  He tells me his plan is to return to the [facility] today, and to keep going to [employer] and church to become sober again.  He has a good follow-up plan in place, we spoke about walk-in hours at behavioral urgent care to establish outpatient psychiatry and therapy.  He is still declining wanting further substance abuse treatment such as intensive outpatient or residential.  He is really hopeful and wanting to start therapy, he feels like he never worked through the grief of his first wife passing around 20 years ago.  He is able to contract for safety at this time.  He denies having any family or friends for me to contact for extra collateral.  He continues to decline starting any psychotropic medications and is wanting to just try therapy first.  He has no access to any weapons or  firearms.  He tells me he has friends he can speak to if he feels he is in mental health crisis again, and verbalizes understanding of resources he can use such as 911, crisis line, or [facility].   ED Assessment Time Calculation: Start Time: 1700 Stop Time: 1730 Total Time in Minutes (Assessment Completion): 30   Past Psychiatric History:  Polysubstance abuse, denies any formal psychiatric diagnosis.  However it appears patient presented to [facility] 09/02/2021 for suicidal ideations and substance abuse.  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 No pertinent family history.  Social History:  Social History   Substance and Sexual Activity  Alcohol Use Yes     Social History   Substance and Sexual Activity  Drug Use Yes   Types: Marijuana    Social History   Socioeconomic History   Marital status: Divorced    Spouse name: Not on file   Number of children: Not on file   Years of education: Not on file   Highest education level: Not on file  Occupational History   Not on file  Tobacco  Use   Smoking status: Every Day    Types: Cigarettes   Smokeless tobacco: Never  Substance and Sexual Activity   Alcohol use: Yes   Drug use: Yes    Types: Marijuana   Sexual activity: Not on file  Other Topics Concern   Not on file  Social History Narrative   Not on file   Social Determinants of Health   Financial Resource Strain: Not on file  Food Insecurity: Not on file  Transportation Needs: Not on file  Physical Activity: Not on file  Stress: Not on file  Social Connections: Not on file    Tobacco Cessation:  A  prescription for an FDA-approved tobacco cessation medication was offered at discharge and the patient refused  Current Medications: Current Facility-Administered Medications  Medication Dose Route Frequency Provider Last Rate Last Admin   acetaminophen (TYLENOL) tablet 650 mg  650 mg Oral Q4H PRN Elson Areas, PA-C   650 mg at 12/16/21 0547   alum & mag hydroxide-simeth (MAALOX/MYLANTA) 200-200-20 MG/5ML suspension 30 mL  30 mL Oral Q6H PRN Sofia, Leslie K, PA-C       ondansetron [facility]) tablet 4 mg  4 mg Oral Q8H PRN Sofia, Leslie K, PA-C       zolpidem (AMBIEN) tablet 5 mg  5 mg Oral QHS PRN Elson Areas, PA-C       Current Outpatient Medications  Medication Sig Dispense Refill   albuterol (VENTOLIN HFA) 108 (90 Base) MCG/ACT inhaler Inhale 2 puffs into the lungs every 4 (four) hours as needed for wheezing or shortness of breath.     PTA Medications: (Not in a hospital admission)   [place] Scale:  Flowsheet Row ED from 12/15/2021 in [facility] EMERGENCY DEPARTMENT ED to Hosp-Admission (Discharged) from 02/18/2021 in [facility]  C-SSRS RISK CATEGORY High Risk No Risk       Psychiatric Specialty Exam: Presentation  General Appearance: Appropriate for Environment  Eye Contact:Good  Speech:Clear and Coherent  Speech Volume:Normal  Handedness:No data recorded  Mood and Affect  Mood:Euthymic  Affect:Congruent   Thought Process  Thought Processes:Coherent  Descriptions of Associations:Intact  Orientation:Full (Time, Place and Person)  Thought Content:Logical  History of Schizophrenia/Schizoaffective disorder:No data recorded Duration of Psychotic Symptoms:No data recorded Hallucinations:Hallucinations: None  Ideas of Reference:None  Suicidal Thoughts:Suicidal Thoughts: No SI Active Intent and/or Plan: Without Intent; Without Plan  Homicidal Thoughts:Homicidal Thoughts: No   Sensorium  Memory:Immediate Fair; Recent  Fair  Judgment:Fair  Insight:Good   Executive Functions  Concentration:Fair  Attention Span:Fair  Recall:Good  Fund of Knowledge:Good  Language:Good   Psychomotor Activity  Psychomotor Activity:Psychomotor Activity: Normal   Assets  Assets:Communication Skills; Desire for Improvement; Resilience; Social Support   Sleep  Sleep:Sleep: Good    Physical Exam: Physical Exam Neurological:     Mental Status: He is alert and oriented to person, place, and time.  Psychiatric:        Behavior: Behavior is cooperative.        Thought Content: Thought content normal.    Review of Systems  Psychiatric/Behavioral:  Positive for depression and substance abuse.   All other systems reviewed and are negative.  Blood pressure 128/73, pulse (!) 56, temperature 97.9 F (36.6 C), temperature source Oral, resp. rate 18, height 6\' 3"  (1.905 m), weight 102.1 kg, SpO2 95 %. Body mass index is 28.12 kg/m.   Demographic Factors:  Male, Low socioeconomic status, and Unemployed  Loss Factors: Financial problems/change in  socioeconomic status  Historical Factors: Substance abuse  Risk Reduction Factors:   Responsible for children under 53 years of age, Sense of responsibility to family, Religious beliefs about death, and Living with another person, especially a relative  Continued Clinical Symptoms:  Alcohol/Substance Abuse/Dependencies  Cognitive Features That Contribute To Risk:  None    Suicide Risk:  Minimal: No identifiable suicidal ideation.  Patients presenting with no risk factors but with morbid ruminations; may be classified as minimal risk based on the severity of the depressive symptoms    Plan Of Care/Follow-up recommendations:  Other:  Please follow up with [facility] walk in hours to establish outpatient care and therapy. They will be closed on 12/17/2021, and will open again on 12/18/2021.   Medical Decision Making: Patient case reviewed and discussed with Dr.  Lovette Cliche.  Patient is able to contract for safety at this time and is requesting discharge.  Denies SI/HI, no access to weapons or firearms.  His plan is to return to [place] and work on getting a job and remaining sober.  No medications at this time.  Will psychiatrically clear for discharge.  EDP, RN, and LCSW notified of disposition.  -Resources in AVS for substance abuse treatment, community resources, outpatient follow-up and therapy, and housing/shelters.  Disposition: Psych cleared  Vesta Mixer, NP 12/16/2021, 11:25 AM

## 2021-12-16 NOTE — ED Notes (Signed)
Patient eating lunch prior to discharge

## 2021-12-16 NOTE — ED Provider Notes (Signed)
Emergency Medicine Observation Re-evaluation Note  Casey Morris is a 56 y.o. male, seen on rounds today.  Pt initially presented to the ED for complaints of Headache and Numbness Currently, the patient is resting.  Physical Exam  BP 128/73 (BP Location: Left Arm)   Pulse (!) 56   Temp 97.9 F (36.6 C) (Oral)   Resp 18   Ht 6\' 3"  (1.905 m)   Wt 102.1 kg   SpO2 95%   BMI 28.12 kg/m  Physical Exam General: NAD Cardiac: Well perfused Lungs: even and unlabored Psych: no agitation  ED Course / MDM  EKG:   I have reviewed the labs performed to date as well as medications administered while in observation.  Recent changes in the last 24 hours include pt initially had been unable to contract for safety and required re-evaluation. Per , NP, the patient's as of this morning has been psych cleared. Resources have been provided in the patient's AVS.  Plan  Current plan is for DC.    Eligha Bridegroom, MD 12/16/21 1214

## 2021-12-16 NOTE — ED Notes (Signed)
Patient given discharge instructions, all questions answered. Patient in possession of all belongings, directed to the discharge area  

## 2021-12-16 NOTE — Discharge Instructions (Signed)
  Outpatient psychiatric Services  Walk in hours for medication management Monday, Wednesday, Thursday, and Friday from 8:00 AM to 11:00 AM Recommend arriving by by 7:30 AM.  It is first come first serve.    Walk in hours for therapy intake Monday and Wednesday only 8:00 AM to 11:00 AM Encouraged to arrive by 7:30 AM.  It is first come first serve   Inpatient patient psychiatric services [facility] offers comprehensive behavioral heath care services for mental health and substance abuse treatment.  Social work can also assist with referral to or getting you into a rehabilitation program short or long term     [facility]  Hours Monday - Friday: Services: 8:00AM - 3:00PM Offices: 8:00AM - 5:00PM  Physical Address [address] [place], [place] [address]   Please use this address for [facility] Mailing Address [address] [address] [place], [place] [address]  The [facility] helps people reconnect This is a safe place to rest, take care of basic needs and access the services and community that make all the difference. Our guests come to the [facility] to take a class, do laundry, meet with a case manager or to get their mail. Sometimes they just need to sit in our dayroom and enjoy a conversation.  Here you will find everything from shower facilities to a computer lab, a mail room, classrooms and meeting spaces.  The [facility] helps people reconnect with their own lives and with the community at large.  A caring community setting One of the most exciting aspects of the IRC is that so many individuals and organizations in the community are a part of the everyday experience. Whether it's a hair stylist or law firm offering services right in-house, our partners make the [facility] a truly interactive resource center where services are brought to our guests. The [facility] brings together a comprehensive community of talented people who not only want to help solve problems, but also to be a part of  our guests' lives.  Integrated Care We take a person-centered approach to assistance that includes: Case management [facility] Mental health nurse Referrals  Fundamental Services We start with necessities: Showers and hygiene supplies Laundry Phone access Mailing addresses and mailboxes Replacement IDs Onsite barbershop Storage lockers [place] winter warming center  Self-Sufficiency We connect our guests with: Skilled trade classes Job skills classes Resume and jobs application assistance Interview training GED classes Professional clothing vouchers Financial literacy 

## 2021-12-22 ENCOUNTER — Other Ambulatory Visit: Payer: Self-pay

## 2021-12-22 ENCOUNTER — Encounter (HOSPITAL_COMMUNITY): Payer: Self-pay | Admitting: Emergency Medicine

## 2021-12-22 ENCOUNTER — Emergency Department (HOSPITAL_COMMUNITY)
Admission: EM | Admit: 2021-12-22 | Discharge: 2021-12-23 | Disposition: A | Discharge status: Other Acute Inpatient Hospital | Payer: Medicaid Other | Attending: Emergency Medicine | Admitting: Emergency Medicine

## 2021-12-22 DIAGNOSIS — J45909 Unspecified asthma, uncomplicated: Secondary | ICD-10-CM | POA: Insufficient documentation

## 2021-12-22 DIAGNOSIS — F322 Major depressive disorder, single episode, severe without psychotic features: Secondary | ICD-10-CM | POA: Insufficient documentation

## 2021-12-22 DIAGNOSIS — Z20822 Contact with and (suspected) exposure to covid-19: Secondary | ICD-10-CM | POA: Insufficient documentation

## 2021-12-22 DIAGNOSIS — R45851 Suicidal ideations: Secondary | ICD-10-CM

## 2021-12-22 DIAGNOSIS — F1420 Cocaine dependence, uncomplicated: Secondary | ICD-10-CM | POA: Insufficient documentation

## 2021-12-22 LAB — CBC WITH DIFFERENTIAL/PLATELET
Abs Immature Granulocytes: 0.02 10*3/uL (ref 0.00–0.07)
Basophils Absolute: 0 10*3/uL (ref 0.0–0.1)
Basophils Relative: 0 %
Eosinophils Absolute: 0.1 10*3/uL (ref 0.0–0.5)
Eosinophils Relative: 2 %
HCT: 41.5 % (ref 39.0–52.0)
Hemoglobin: 13.2 g/dL (ref 13.0–17.0)
Immature Granulocytes: 0 %
Lymphocytes Relative: 37 %
Lymphs Abs: 2.5 10*3/uL (ref 0.7–4.0)
MCH: 23.5 pg — ABNORMAL LOW (ref 26.0–34.0)
MCHC: 31.8 g/dL (ref 30.0–36.0)
MCV: 74 fL — ABNORMAL LOW (ref 80.0–100.0)
Monocytes Absolute: 0.7 10*3/uL (ref 0.1–1.0)
Monocytes Relative: 10 %
Neutro Abs: 3.4 10*3/uL (ref 1.7–7.7)
Neutrophils Relative %: 51 %
Platelets: 269 10*3/uL (ref 150–400)
RBC: 5.61 MIL/uL (ref 4.22–5.81)
RDW: 16.8 % — ABNORMAL HIGH (ref 11.5–15.5)
WBC: 6.7 10*3/uL (ref 4.0–10.5)
nRBC: 0 % (ref 0.0–0.2)

## 2021-12-22 LAB — COMPREHENSIVE METABOLIC PANEL
ALT: 18 U/L (ref 0–44)
AST: 24 U/L (ref 15–41)
Albumin: 4.2 g/dL (ref 3.5–5.0)
Alkaline Phosphatase: 80 U/L (ref 38–126)
Anion gap: 11 (ref 5–15)
BUN: 18 mg/dL (ref 6–20)
CO2: 23 mmol/L (ref 22–32)
Calcium: 9.2 mg/dL (ref 8.9–10.3)
Chloride: 108 mmol/L (ref 98–111)
Creatinine, Ser: 0.89 mg/dL (ref 0.61–1.24)
GFR, Estimated: >60 mL/min (ref >60)
Glucose, Bld: 93 mg/dL (ref 70–99)
Potassium: 3.6 mmol/L (ref 3.5–5.1)
Sodium: 142 mmol/L (ref 135–145)
Total Bilirubin: 1 mg/dL (ref 0.3–1.2)
Total Protein: 7.5 g/dL (ref 6.5–8.1)

## 2021-12-22 LAB — RAPID URINE DRUG SCREEN, HOSP PERFORMED
Amphetamines: NOT DETECTED
Barbiturates: NOT DETECTED
Benzodiazepines: NOT DETECTED
Cocaine: POSITIVE — AB
Opiates: NOT DETECTED
Tetrahydrocannabinol: NOT DETECTED

## 2021-12-22 LAB — RESP PANEL BY RT-PCR (FLU A&B, COVID) ARPGX2
Influenza A by PCR: NEGATIVE
Influenza B by PCR: NEGATIVE
SARS Coronavirus 2 by RT PCR: NEGATIVE

## 2021-12-22 LAB — ETHANOL: Alcohol, Ethyl (B): <10 mg/dL (ref <10)

## 2021-12-22 MED ORDER — ACETAMINOPHEN 325 MG PO TABS: 650.0000 mg | ORAL_TABLET | ORAL | Status: DC | PRN

## 2021-12-22 NOTE — ED Provider Notes (Signed)
[facility] EMERGENCY DEPARTMENT Provider Note   CSN: [id number] Arrival date & time: 12/22/21  2036     History {Add pertinent medical, surgical, social history, OB history to HPI:1} Chief Complaint  Patient presents with   Suicidal    Casey Morris is a 56 y.o. male with a hx of prior stroke, asthma, and depression who presents to the ED with complaints of SI. Patient relays his aunt recently passed away whom he was close with which lead to increased sadness/depression. Reports he turned to drugs and has been smoking cocaine, most recently a few hours PTA. States he is here for help. He has had SI, some plans, but no attempts. Denies HI or hallucinations. Denies pain.   HPI     Home Medications Prior to Admission medications   Medication Sig Start Date End Date Taking? Authorizing Provider  albuterol (VENTOLIN HFA) 108 (90 Base) MCG/ACT inhaler Inhale 2 puffs into the lungs every 4 (four) hours as needed for wheezing or shortness of breath. 06/15/19   [provider]      Allergies    Iodides and Iodinated contrast media    Review of Systems   Review of Systems  Constitutional:  Negative for chills and fever.  Respiratory:  Negative for shortness of breath.   Cardiovascular:  Negative for chest pain.  Gastrointestinal:  Negative for abdominal pain and vomiting.  Neurological:  Negative for syncope.  Psychiatric/Behavioral:  Positive for suicidal ideas. Negative for hallucinations.   All other systems reviewed and are negative.   Physical Exam Updated Vital Signs BP 139/83   Pulse 85   Temp 98.4 F (36.9 C) (Oral)   Resp 16   SpO2 97%  Physical Exam Vitals and nursing note reviewed.  Constitutional:      General: He is not in acute distress.    Appearance: He is well-developed. He is not toxic-appearing.  HENT:     Head: Normocephalic and atraumatic.  Eyes:     General:        Right eye: No discharge.        Left eye: No discharge.      Conjunctiva/sclera: Conjunctivae normal.  Cardiovascular:     Rate and Rhythm: Normal rate and regular rhythm.  Pulmonary:     Effort: No respiratory distress.     Breath sounds: Normal breath sounds. No wheezing or rales.  Abdominal:     General: There is no distension.     Palpations: Abdomen is soft.     Tenderness: There is no abdominal tenderness.  Musculoskeletal:     Cervical back: Neck supple.  Skin:    General: Skin is warm and dry.  Neurological:     Mental Status: He is alert.     Comments: Clear speech.   Psychiatric:        Behavior: Behavior is cooperative.        Thought Content: Thought content includes suicidal ideation. Thought content does not include homicidal ideation.     ED Results / Procedures / Treatments   Labs (all labs ordered are listed, but only abnormal results are displayed) Labs Reviewed  RAPID URINE DRUG SCREEN, HOSP PERFORMED - Abnormal; Notable for the following components:      Result Value   Cocaine POSITIVE (*)    All other components within normal limits  CBC WITH DIFFERENTIAL/PLATELET - Abnormal; Notable for the following components:   MCV 74.0 (*)    MCH 23.5 (*)  RDW 16.8 (*)    All other components within normal limits  RESP PANEL BY RT-PCR (FLU A&B, COVID) ARPGX2  COMPREHENSIVE METABOLIC PANEL  ETHANOL    EKG EKG Interpretation  Date/Time:  Saturday December 22 2021 20:52:07 EDT Ventricular Rate:  82 PR Interval:  174 QRS Duration: 94 QT Interval:  396 QTC Calculation: 462 R Axis:   65 Text Interpretation: Normal sinus rhythm Normal ECG Confirmed by Zadie Rhine ([id number]) on 12/22/2021 11:14:06 PM  Radiology No results found.  Procedures Procedures  {Document cardiac monitor, telemetry assessment procedure when appropriate:1}  Medications Ordered in ED Medications - No data to display  ED Course/ Medical Decision Making/ A&P                           Medical Decision Making  Patient presents to  the ED for behavioral health assessment with   Additional history obtained:  Additional history obtained from chart review & nursing note review.   Lab Tests:  I have viewed & interpreted screening labs including CBC, CMP, ethanol level, UDS: consistent w/ patient's reported cocaine use.   ED Course:  Patient is medically cleared. Consult placed to TTS. Disposition per [facility].   The patient has been placed in psychiatric observation due to the need to provide a safe environment for the patient while obtaining psychiatric consultation and evaluation, as well as ongoing medical and medication management to treat the patient's condition.  The patient has not been placed under full IVC at this time.  Portions of this note were generated with Scientist, clinical (histocompatibility and immunogenetics). Dictation errors may occur despite best attempts at proofreading.   Final Clinical Impression(s) / ED Diagnoses Final diagnoses:  None    Rx / DC Orders ED Discharge Orders     None

## 2021-12-22 NOTE — ED Triage Notes (Signed)
Patient reports suicidal ideation , plans to overdose , no hallucinations .

## 2021-12-23 ENCOUNTER — Ambulatory Visit (HOSPITAL_COMMUNITY)
Admission: EM | Admit: 2021-12-23 | Discharge: 2021-12-23 | Disposition: A | Discharge status: Home / Self Care | Payer: No Payment, Other | Attending: Nurse Practitioner | Admitting: Nurse Practitioner

## 2021-12-23 ENCOUNTER — Encounter (HOSPITAL_COMMUNITY): Payer: Self-pay | Admitting: Registered Nurse

## 2021-12-23 DIAGNOSIS — F17210 Nicotine dependence, cigarettes, uncomplicated: Secondary | ICD-10-CM | POA: Insufficient documentation

## 2021-12-23 DIAGNOSIS — F1410 Cocaine abuse, uncomplicated: Secondary | ICD-10-CM | POA: Insufficient documentation

## 2021-12-23 DIAGNOSIS — Z20822 Contact with and (suspected) exposure to covid-19: Secondary | ICD-10-CM | POA: Insufficient documentation

## 2021-12-23 DIAGNOSIS — Z634 Disappearance and death of family member: Secondary | ICD-10-CM | POA: Insufficient documentation

## 2021-12-23 DIAGNOSIS — F1914 Other psychoactive substance abuse with psychoactive substance-induced mood disorder: Secondary | ICD-10-CM | POA: Insufficient documentation

## 2021-12-23 DIAGNOSIS — F332 Major depressive disorder, recurrent severe without psychotic features: Secondary | ICD-10-CM | POA: Insufficient documentation

## 2021-12-23 DIAGNOSIS — F1994 Other psychoactive substance use, unspecified with psychoactive substance-induced mood disorder: Secondary | ICD-10-CM | POA: Diagnosis present

## 2021-12-23 DIAGNOSIS — R45851 Suicidal ideations: Secondary | ICD-10-CM | POA: Insufficient documentation

## 2021-12-23 MED ORDER — MAGNESIUM HYDROXIDE 400 MG/5ML PO SUSP: 30.0000 mL | Freq: Every day | ORAL | Status: DC | PRN

## 2021-12-23 MED ORDER — TRAZODONE HCL 50 MG PO TABS: 50.0000 mg | ORAL_TABLET | Freq: Every evening | ORAL | Status: DC | PRN

## 2021-12-23 MED ORDER — ALUM & MAG HYDROXIDE-SIMETH 200-200-20 MG/5ML PO SUSP: 30.0000 mL | ORAL | Status: DC | PRN

## 2021-12-23 MED ORDER — HYDROXYZINE HCL 25 MG PO TABS: 25.0000 mg | ORAL_TABLET | Freq: Three times a day (TID) | ORAL | Status: DC | PRN

## 2021-12-23 MED ORDER — ACETAMINOPHEN 325 MG PO TABS: 650.0000 mg | ORAL_TABLET | Freq: Four times a day (QID) | ORAL | Status: DC | PRN

## 2021-12-23 NOTE — ED Notes (Signed)
Patient A&O x 4, ambulatory. Patient discharged in no acute distress. Patient denied SI/HI, A/VH upon discharge. Patient verbalized understanding of all discharge instructions explained by staff, to include follow up appointments, and safety plan. Patient reported mood 10/10.  Pt belongings returned to patient from locker # 10 intact. Patient escorted to lobby via staff for transport to destination. Safety maintained.

## 2021-12-23 NOTE — BH Assessment (Signed)
Pt has been accepted at [facility] for continuous assessment by Roselyn Bering, NP. Number for RN report is [phone]. Notified Dr Zadie Rhine and Luisa Hart, RN of acceptance via secure message.   Pamalee Leyden, [facility], [facility] Triage Specialist [phone]

## 2021-12-23 NOTE — ED Notes (Signed)
Patient is currently being assessed via TTS.

## 2021-12-23 NOTE — ED Notes (Signed)
Patient resting quietly in bed with eyes closed. Respirations equal and unlabored, skin warm and dry, NAD. No change in assessment or acuity. Routine safety checks conducted according to facility protocol. Will continue to monitor for safety.   
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
## 2021-12-23 NOTE — ED Provider Notes (Signed)
FBC/OBS ASAP Discharge Summary  Date and Time: 12/23/2021 11:44 AM  Name: Casey Morris  MRN:  [id number]   Discharge Diagnoses:  Final diagnoses:  Substance induced mood disorder (HCC)  Cocaine use disorder (HCC)  Suicidal ideation    Subjective: Pt is a 56 year old divorced male who [facility] reporting depressive symptoms, suicidal ideation, and cocaine use. [facility] (for depression, suicidal ideation) at which time he was psychiatrically cleared with follow up resources.  Complaints 12/22/21 [facility] was recent death of aunt that he was close to caused him to feel more depress leading to suicidal ideation with plan to walk into traffic.  [facility]   Casey Morris, 56 y.o., male patient seen face to face by this provider, consulted with Dr. Nelly RoutArchana Kumar; and chart reviewed on 12/23/21.  On evaluation Casey Morris reports he feels better this morning.  He denies suicidal/self-harm/homicidal ideation, psychosis, and paranoia.  He states he is eating and sleeping without difficulty.  He reports he is only interested in counseling.  States he doesn't feel he needs medication for depression "Only someone to talk to."  D[facility]ed [facility] for therapy intake.  Understanding voiced and agreed he would do walk in Monday or one day next week.    During evaluation Casey Morris is lying on his side in bed with no noted distress.  He is alert, oriented x 4, calm, cooperative and attentive.  His mood is dysphoric with congruent affect.  He has normal speech, and behavior.  Objectively there is no evidence of psychosis/mania or delusional thinking.  He is able to converse coherently, goal directed thoughts, no distractibility, or pre-occupation.  He also denies suicidal/self-harm/homicidal ideation, psychosis, and paranoia.  Patient answered question appropriately.      Stay Summary: Casey Morris was admitted for Substance induced mood disorder [facility](HCC), crisis management, safety, and stabilization.  Medical problems were identified and treated as needed.  Home medications were restarted, adjusted, or new medications added as needed or appropriate.  Medications treated with during admission are as follows. Meds ordered this encounter  Medications   acetaminophen (TYLENOL) tablet 650 mg   alum & mag hydroxide-simeth (MAALOX/MYLANTA) 200-200-20 MG/5ML suspension 30 mL   magnesium hydroxide (MILK OF MAGNESIA) suspension 30 mL   traZODone (DESYREL) tablet 50 mg   hydrOXYzine (ATARAX) tablet 25 mg    Labs reviewed: ETOH, CBC/Diff, CMP, Resp Panel, UDS,  Improvement was monitored by observation and Casey Morris 's verbal report emotional status and symptom reduction along with clinical staff report.  Casey Morris was evaluated by the treatment team for stability and plans for continued recovery upon discharge. Casey Morris 's motivation was an integral factor for scheduling further treatment. Employment, transportation, bed availability, health status, family support, and any pending legal issues were also considered during stay. He was offered further treatment options upon discharge including but not limited to Residential, Intensive Outpatient, and Outpatient treatment.    Casey Morris will follow up with  Follow-up Information     Go to  [facility].   Specialty: Urgent Care Why: Follow up during walk in hours for medication management and counseling intake Contact information: [address] [phone]                Discharge Instructions       [facility]: Outpatient psychiatric Services  New Patient Assessment and Therapy Walk-in Monday thru Thursday 8:00 am first come first serve until slots are full Every Friday from 1:00 pm to 4:00 pm  first come first serve until slots are full  New Patient Psychiatric Medication Management Monday thru Friday from 8:00 am to 11:00 am first come first served until slots are full  For all walk-ins we ask that you arrive by 7:15 am because patients will be seen in there order of arrival.   Availability is limited, and therefore you may not be seen on the same day that you walk in.  Our goal is to serve and meet the needs of our community to the best of our ability.      [facility] Address: [address] [place], [place], [place] [address] Phone: [phone]  Supported Employment The supported employment program is a person-centered, individualized, evidence-based support service that helps members choose, acquire, and maintain competitive employment in our community. This service supports the varying needs of individuals and promotes community inclusion and employment success. Members enrolled in the supported employment program can expect the following:  Development of an individual career plan Community based job placement Job shadowing Job development On-site job Furniture conservator/restorer and support  Supported Education Supported education helps our members receive the education and training they need to achieve their learning and recovery goals. This will assist members with becoming gainfully employed in the job or career of their choice. The program includes assistance with: Registering for disability accommodations Enrolling in school and registering for classes Learning communication skills Scheduling tutoring sessions within your school [facility] partners with Vocational Rehabilitation to help increase the success of clients seeking employment and educational goals.  Want to learn more about our programs?   Please contact our intake department INTAKE: [phone] Ext [phone]  Mailing: [address] [address]   [place], [place] [address]   [email or web address]     Interactive [facility] Center  Hours Monday - Friday: Services: 8:00AM - 3:00PM Offices: 8:00AM - 5:00PM  Physical Address [address] [place], [place] [address]   Please use this address for [facility] Mailing Address [address] [address] [place], [place] [address]  The [facility] helps people reconnect This is a safe place to rest, take care of basic needs and access the services and community that make all the difference. Our guests come to the [facility] to take a class, do laundry, meet with a case manager or to get their mail. Sometimes they just need to sit in our dayroom and enjoy a conversation.  Here you will find everything from shower facilities to a computer lab, a mail room, classrooms and meeting spaces.  The IRC helps people reconnect with their own lives and with the community at large.  A caring community setting One of the most exciting aspects of the IRC is that so many individuals and organizations in the community are a part of the everyday experience. Whether it's a hair stylist or law firm offering services right in-house, our partners make the [facility] a truly interactive resource center where services are brought to our guests. The IRC brings together a comprehensive community of talented people who not only want to help solve problems, but also to be a part of our guests' lives.  Integrated Care We take a person-centered approach to assistance that includes: Case management Geneticist, molecular Medical clinic Mental health nurse Referrals  Fundamental Services We start with necessities: Showers and  hygiene supplies Therapist, art addresses and mailboxes Replacement IDs Onsite barbershop Storage lockers [place] winter warming center  Self-Sufficiency We connect our guests with: Skilled trade classes Job skills classes Resume and jobs application assistance Interview training GED Academic librarian   Substance  Abuse Treatment Programs  Intensive Outpatient Programs [facility]     [address]      [place], [place]                   [phone]       [facility] [address] [place] [address] [place], [place] [phone]  [facility]     (Inpatient and outpatient)     [address]           [phone]    [facility] [phone] (Suboxone and Methadone)  [address]      [place], [place] [address]      [phone]       [address] [place], [place] [phone]  [facility] (Outpatient/Inpatient, Chemical)    (insurance only) [phone]             [facility] (Groups & Residential) [place], [place] [phone]     [facility]     [address]     [place], [place]      [phone]       [facility] (for caregivers and family) [phone] [address] [place], [place] [phone]      Residential Treatment Programs [facility]      [address] [place], [place] [address]  [phone]       [facility] [address], [place], [place] [address] [phone]  [facility] [place]        [phone]       [facility] [phone]  [facility] (Addiction Recovery [facility]             [address]                                         [place], [place]                                                [phone] or [phone]                               [facility] [place] [address] [place], [address] [phone]  [facility]    [address]      [place], [place]     [phone]       [facility] [address] [place], [place] [phone]  [facility]   [address] [place], [place] [address]     [phone]      Admissions: 8am-3pm M-F  Residential Treatment Services (RTS) [address] [place],  [place] [phone]  [facility] Program: Residential Program [phone] Days)   [place], [place]      [phone] or [phone]     ADATC: [facility] [place], [place] (Walk in Hours over  the weekend or by referral)  [facility] [address] [place], [place], [place] [address] [phone]  Crisis Mobile: [facility]:  [phone] (for crisis response 24 hours a day) [facility] Hotline:      [phone] Outpatient Psychiatry and Counseling  [facility]: Mobile Crisis Management 24 hours:  [phone]  [facility] of the [employer] sliding scale fee and walk in schedule: M-F 8am-12pm/1pm-3pm [address]  [place], [place] [address] [phone]  [facility] [address] [place], [place] [address] [phone]  [facility] (Formerly [facility] as [facility] [employer])- new patient walk-in appointments available Monday - Friday 8am -3pm.          [address] [place], [place] [address] [phone] or crisis line- [phone]  [facility] Outpatient Services/ Intensive Outpatient Therapy Program [address] [place], [place] [address] [phone]  [facility]                  Crisis Services      [phone]     [place], [place] [address]                 [facility]   [facility] [phone]. [address] [place], [place] [address]   [employer] of Care          [address]  [place], [place] [address]       [phone]  [facility] [address] [place], [place] [address] [phone]  [facility]    [address]    [place], [place] [address]     [phone]       Andee Poles, MD     [address]     [place] [address]     [phone]       [facility] [address] [place] [address]  [facility]     [address] [place], [place]      [phone]       [facility] Eulogio Ditch, MD [address] [place], [place] [address] [phone]  [facility]     [address]     [place], [place] [address]     [phone]       [facility] [address] [place], [place] [address] [phone] Resources for Temporary Residential Assistance/Crisis Centers  DAY CENTERS [facility]) M-F 8am-3pm   [address] [place], [place] [address]   [phone] Services include: laundry, barbering, support groups, case management, phone  & computer access, showers, AA/NA mtgs, mental health/substance abuse nurse, job skills class, disability information, VA assistance, spiritual classes, etc.   HOMELESS SHELTERS  [facility]     [facility]   [address] [place]     [phone]              [employer] (women and children)       [address] [place], [place] [address] [phone] [email or web address] for application and process Application Required  [facility]   [address]    [place] [address]     [phone]  [employer] [facility] [place] [address] [place], [place] [address] [phone] application appt.) Application Required  [facility] (women only)    [address]     [place], [place] [address]     [phone]      Intake starts 6pm daily Need valid ID, SSC, & Police report [employer] [address] [place], [place] [phone] Application Required  [employer] (men only)     [address]      [place], [place]     [phone]       [facility] of the [place] (Pregnant women only) [address] [place], [place] [phone]  [facility]      [address]      [place], [place] [address]     [phone]             [facility] [address] [place], [place] [phone] 90 day commitment/SA/Application process  [facility](men only)     [address]     [place], [place]     [phone]       Check-in at [facility] [address] [place], [place] [address] [phone] Men/Women/Women and Children must be there by 7 pm  [facility] [place], [place] [phone]                    Upon completion of this admission Casey Morris was both mentally and medically stable for discharge denying suicidal/homicidal ideation, auditory/visual/tactile hallucinations, delusional thoughts, and paranoia.     Total Time spent with patient: 30 minutes  Past Psychiatric History: Denies prior psychiatric history other than cocaine use disorder  Past Medical History:  Past Medical History:  Diagnosis Date   Asthma    Stroke [facility])     Past Surgical History:  Procedure Laterality Date   CARDIAC CATHETERIZATION     Family History: History reviewed. No pertinent family history. Family Psychiatric History: None reported Social History:  Social History   Substance and Sexual Activity  Alcohol Use Yes     Social History   Substance and Sexual Activity  Drug Use Yes   Types: Marijuana    Social History   Socioeconomic History   Marital status: Divorced    Spouse name: Not on file   Number of children: Not on file   Years of education: Not on file   Highest education level: Not on file  Occupational History   Not on file  Tobacco Use   Smoking status: Every Day    Types: Cigarettes   Smokeless tobacco: Never  Substance and Sexual Activity   Alcohol use: Yes   Drug use: Yes    Types: Marijuana   Sexual activity: Not on file  Other Topics Concern   Not on file  Social History Narrative   Not on file   Social  Determinants of Health   Financial Resource Strain: Not on file  Food Insecurity: Not on file  Transportation Needs: Not on file  Physical Activity: Not on file  Stress: Not on file  Social Connections: Not on file   SDOH:  SDOH Screenings   Depression (PHQ2-9): Low Risk  (12/23/2021)  Tobacco Use: High Risk (12/23/2021)    Tobacco Cessation:  A prescription for an FDA-approved tobacco cessation medication was offered at  discharge and the patient refused  Current Medications:  Current Facility-Administered Medications  Medication Dose Route Frequency Provider Last Rate Last Admin   acetaminophen (TYLENOL) tablet 650 mg  650 mg Oral Q6H PRN Bobbitt, Shalon E, NP       alum & mag hydroxide-simeth (MAALOX/MYLANTA) 200-200-20 MG/5ML suspension 30 mL  30 mL Oral Q4H PRN Bobbitt, Shalon E, NP       hydrOXYzine (ATARAX) tablet 25 mg  25 mg Oral TID PRN Bobbitt, Shalon E, NP       magnesium hydroxide (MILK OF MAGNESIA) suspension 30 mL  30 mL Oral Daily PRN Bobbitt, Shalon E, NP       traZODone (DESYREL) tablet 50 mg  50 mg Oral QHS PRN Bobbitt, Shalon E, NP       Current Outpatient Medications  Medication Sig Dispense Refill   albuterol (VENTOLIN HFA) 108 (90 Base) MCG/ACT inhaler Inhale 2 puffs into the lungs every 4 (four) hours as needed for wheezing or shortness of breath.     PTA Medications: (Not in a hospital admission)     12/23/2021   11:13 AM  Depression screen PHQ 2/9  Decreased Interest 0  Down, Depressed, Hopeless 1  PHQ - 2 Score 1  Altered sleeping 0  Tired, decreased energy 0  Change in appetite 0  Feeling bad or failure about yourself  1  Trouble concentrating 0  Moving slowly or fidgety/restless 0  Suicidal thoughts 0  PHQ-9 Score 2  Difficult doing work/chores Not difficult at all    Flowsheet Row ED from [facility] from [facility]15/2021 in [facility] ED to Hosp-Admission  (Discharged) from 02/18/2021 in [facility]  C-SSRS RISK CATEGORY Moderate Risk High Risk No Risk       Musculoskeletal  Strength & Muscle Tone: within normal limits Gait & Station: normal Patient leans: N/A  Psychiatric Specialty Exam  Presentation  General Appearance: Appropriate for Environment  Eye Contact:Good  Speech:Clear and Coherent; Normal Rate  Speech Volume:Normal  Handedness:Right   Mood and Affect  Mood:Dysphoric  Affect:Appropriate; Congruent   Thought Process  Thought Processes:Coherent; Goal Directed  Descriptions of Associations:Intact  Orientation:Full (Time, Place and Person)  Thought Content:Logical  Diagnosis of Schizophrenia or Schizoaffective disorder in past: No    Hallucinations:Hallucinations: None  Ideas of Reference:None  Suicidal Thoughts:Suicidal Thoughts: No SI Active Intent and/or Plan: With Intent; With Plan  Homicidal Thoughts:Homicidal Thoughts: No   Sensorium  Memory:Immediate Good; Recent Good  Judgment:Intact  Insight:Present   Executive Functions  Concentration:Good  Attention Span:Good  Recall:Good  Fund of Knowledge:Good  Language:Good   Psychomotor Activity  Psychomotor Activity:Psychomotor Activity: Normal   Assets  Assets:Communication Skills; Desire for Improvement; Physical Health; Resilience   Sleep  Sleep:Sleep: Good Number of Hours of Sleep: -1   Nutritional Assessment (For OBS and FBC admissions only) Has the patient had a weight loss or gain of 10 pounds or more in the last 3 months?: No Has the patient had a decrease in food intake/or appetite?: No Does the patient have dental problems?: No Does the patient have eating habits or behaviors that may be indicators of an eating disorder including binging or inducing vomiting?: No Has the patient recently lost weight without trying?: 0 Has the patient been eating poorly because of a decreased appetite?:  0 Malnutrition Screening Tool Score: 0    Physical Exam  Physical Exam Vitals and nursing note reviewed.  Exam conducted with a chaperone present.  Constitutional:      General: He is not in acute distress.    Appearance: Normal appearance. He is not ill-appearing.  Eyes:     Pupils: Pupils are equal, round, and reactive to light.  Cardiovascular:     Rate and Rhythm: Normal rate.  Pulmonary:     Effort: Pulmonary effort is normal.  Musculoskeletal:        General: Normal range of motion.     Cervical back: Normal range of motion.  Skin:    General: Skin is warm and dry.  Neurological:     Mental Status: He is alert and oriented to person, place, and time.  Psychiatric:        Attention and Perception: Attention and perception normal. He does not perceive auditory or visual hallucinations.        Mood and Affect: Affect normal. Depressed: dysphoric.        Speech: Speech normal.        Behavior: Behavior normal. Behavior is cooperative.        Thought Content: Thought content normal. Thought content is not paranoid or delusional. Thought content does not include homicidal or suicidal ideation.        Cognition and Memory: Cognition normal.        Judgment: Judgment normal.    Review of Systems  Constitutional: Negative.   HENT: Negative.    Eyes: Negative.   Respiratory: Negative.    Cardiovascular: Negative.   Gastrointestinal: Negative.   Genitourinary: Negative.   Musculoskeletal: Negative.   Skin: Negative.   Neurological: Negative.   Endo/Heme/Allergies: Negative.   Psychiatric/Behavioral:  Positive for substance abuse. Negative for memory loss. Depression: Stable. Hallucinations: Denies. Suicidal ideas: Denies.Nervous/anxious: Stable. Insomnia: Denies.    Blood pressure 108/74, pulse 66, temperature 98.6 F (37 C), temperature source Oral, resp. rate 18, SpO2 96 %. There is no height or weight on file to calculate BMI.  Demographic Factors:  Male, Low  socioeconomic status, and Unemployed  Loss Factors: Homelessness  Historical Factors: Substance use disorder  Risk Reduction Factors:   Religious beliefs about death  Continued Clinical Symptoms:  Alcohol/Substance Abuse/Dependencies  Cognitive Features That Contribute To Risk:  None    Suicide Risk:  Minimal: No identifiable suicidal ideation.  Patients presenting with no risk factors but with morbid ruminations; may be classified as minimal risk based on the severity of the depressive symptoms  Plan Of Care/Follow-up recommendations:  Other:  Follow up with resources given  Disposition: No evidence of imminent risk to self or others at present.   Patient does not meet criteria for psychiatric inpatient admission. Supportive therapy provided about ongoing stressors. Discussed crisis plan, support from social network, calling 911, coming to the Emergency Department, and calling Suicide Hotline.    Denisha Hoel, NP 12/23/2021, 11:44 AM

## 2021-12-23 NOTE — ED Notes (Signed)
Patient A&Ox4. Patient denies SI/HI and AVH at the time of assessment. Patient reports increased depression that started after his Aunt died. Patient states that he still has depression from when his wife passed away 33 years ago. Patient states he has intermittent SI when he thinks about wife and Aunt. He also reports he is sad because he is unable to attend his Aunt funeral because it will be held in [place].Patient denies any physical complaints when asked. No acute distress noted. Support and encouragement provided. Routine safety checks conducted according to facility protocol. Encouraged patient to notify staff if thoughts of harm toward self or others arise. Patient verbalize understanding and agreement. Will continue to monitor for safety.

## 2021-12-23 NOTE — Discharge Instructions (Addendum)
[facility]: Outpatient psychiatric Services  New Patient Assessment and Therapy Walk-in Monday thru Thursday 8:00 am first come first serve until slots are full Every Friday from 1:00 pm to 4:00 pm first come first serve until slots are full  New Patient Psychiatric Medication Management Monday thru Friday from 8:00 am to 11:00 am first come first served until slots are full  For all walk-ins we ask that you arrive by 7:15 am because patients will be seen in there order of arrival.   Availability is limited, and therefore you may not be seen on the same day that you walk in.  Our goal is to serve and meet the needs of our community to the best of our ability.      [facility] Address: [address] [place], [place], [place] [address] Phone: [phone]  Supported Employment The supported employment program is a person-centered, individualized, evidence-based support service that helps members choose, acquire, and maintain competitive employment in our community. This service supports the varying needs of individuals and promotes community inclusion and employment success. Members enrolled in the supported employment program can expect the following:  Development of an individual career plan Community based job placement Job shadowing Job development On-site job Furniture conservator/restorer and support  Supported Education Supported education helps our members receive the education and training they need to achieve their learning and recovery goals. This will assist members with becoming gainfully employed in the job or career of their choice. The program includes assistance with: Registering for disability accommodations Enrolling in school and registering for classes Learning communication skills Scheduling tutoring sessions within your school [facility] partners with Vocational Rehabilitation to help increase the success of clients  seeking employment and educational goals.  Want to learn more about our programs?   Please contact our intake department INTAKE: [phone] Ext [phone]  Mailing: [address] [address]   [place], [place] [address]   [email or web address]    Interactive Resource Center  Hours Monday - Friday: Services: 8:00AM - 3:00PM Offices: 8:00AM - 5:00PM  Physical Address [address] [place], [place] [address]   Please use this address for [facility] Mailing Address [address] [address] [place], [place] [address]  The [facility] helps people reconnect This is a safe place to rest, take care of basic needs and access the services and community that make all the difference. Our guests come to the [facility] to take a class, do laundry, meet with a case manager or to get their mail. Sometimes they just need to sit in our dayroom and enjoy a conversation.  Here you will find everything from shower facilities to a computer lab, a mail room, classrooms and meeting spaces.  The IRC helps people reconnect with their own lives and with the community at large.  A caring community setting One of the most exciting aspects of the IRC is that so many individuals and organizations in the community are a part of the everyday experience. Whether it's a hair stylist or law firm offering services right in-house, our partners make the [facility] a truly interactive resource center where services are brought to our guests. The IRC brings together a comprehensive community of talented people who not only want to help solve problems, but also to be a part of our guests' lives.  Integrated Care We take a person-centered approach to assistance that includes: Case management Geneticist, molecular Medical clinic Mental health nurse Referrals  Fundamental Services We start with necessities:  Showers and Armed forces operational officer addresses and mailboxes Replacement IDs Onsite barbershop Storage lockers [place] winter warming  center  Self-Sufficiency We connect our guests with: Skilled trade classes Job skills classes Resume and jobs application assistance Interview training GED Academic librarian   Substance Abuse Treatment Programs  Intensive Outpatient Programs [employer]     [address]      [place], [place]                   [phone]       [facility] [address] [place], [place] [phone]  [facility]     (Inpatient and outpatient)     [address]           [phone]    [facility] [phone] (Suboxone and Methadone)  [address]      [place], [place] [address]      [phone]       [address] [place], [place] [phone]  [facility] (Outpatient/Inpatient, Chemical)    (insurance only) [phone]             [facility] (Groups & Residential) [place], [place] [phone]     [facility]     [address]     [place], [place]      [phone]       [facility] (for caregivers and family) [phone] [address] [place], [place] [phone]      Residential Treatment Programs [facility]      [address], [place], [place] [address]  [phone]       [facility] [address] [place], [place] [address] [phone]  [facility] [place]        [phone]       [facility] [phone]  [facility] (Addiction [facility]             [address]                                         [place], [place]                                                [phone] or [phone]                               [facility] [address] [place], [address] [phone]  [facility]    [address]      [place], [place]     [phone]       [facility] [address] [place],  [place] [phone]  [facility]   [address] [place], [place] [address]     [phone]      Admissions: 8am-3pm M-F  [facility] Services (RTS) [address] [place], [place] [phone]  [facility] Program: Residential Program [phone] Days)   [place], [place]      [phone] or [phone]     ADATC: [facility] [place], [place] (Walk in  Hours over the weekend or by referral)  [facility] [address] [place], [place], [place] [address] [phone]  Crisis Mobile: [facility]:  [phone] (for crisis response 24 hours a day) [facility] Hotline:      815-620-8331 Outpatient Psychiatry and Counseling  [facility]: Mobile Crisis Management 24 hours:  [phone]  [facility] of the [employer] sliding scale fee and walk in schedule: M-F 8am-12pm/1pm-3pm [address]  [place], [place] [address] [phone]  [facility] [address] [place], [place] [address] [phone]  [facility] (Formerly [facility] as [facility] [employer])- new patient walk-in appointments available Monday - Friday 8am -3pm.          [address] [place], [place] [address] [phone] or crisis line- [phone]  [facility] Outpatient Services/ Intensive Outpatient Therapy Program [address] [place], [place] [address] [phone]  [facility]                  Crisis Services      [phone]     [place], [place] [address]                 [facility]   [facility] [phone]. [address] [place], [place] [address]   [facility]          [address]  [place], [place] [address]       [phone]  [facility] [address] [place], [place] [address] [phone]  [facility]    [address]    [place], [place] [address]     [phone]       Andee Poles, MD     [address]     [place] [address]     [phone]       [facility] [address] [place] [address]  [facility]     [address] [place], [place]      [phone]       [facility] Eulogio Ditch, MD [address] [place], [place] [address] [phone]  [facility]     [address]     [place], [place] [address]     [phone]       [facility] [address] [place], [place] [address] [phone] Resources for Temporary Residential Assistance/Crisis Centers  DAY CENTERS [facility]) M-F 8am-3pm   [address] [place], [place] [address]   [phone] Services include: laundry, barbering, support groups, case management, phone  & computer access, showers, AA/NA mtgs, mental health/substance abuse nurse, job skills class, disability information, VA assistance, spiritual classes, etc.   HOMELESS SHELTERS  [facility]     [facility]   [address], [place]     [phone]              [employer] (women and children)       [address] [place], [place] [address] [phone] [email or web address] for application and process Application Required  [facility]   [address]    [place] [address]     [phone]  [employer] [facility] [place] [address] [place], [place] [address] [phone] application appt.) Application Required  [facility] (women only)    [address]     [place], [place] [address]     [phone]      Intake starts 6pm daily Need valid ID, SSC, & Police report [employer] [address] [place], [place] [phone] Application  Required  [employer] (men only)     [address]      [place], [place]     [phone]       [facility] [place] (Pregnant women only) [address] [place], [place] [phone]  [facility]      [address]      [place], [place] [address]     [phone]             [facility] [address] [place], [place] [phone] 90 day commitment/SA/Application process  [facility](men only)     [address]     [place], [place]     [phone]       Check-in at [facility] [address] [place], [place] [address] [phone] Men/Women/Women and Children must be there by 7 pm  [facility] [place], [place] [phone]
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
## 2021-12-23 NOTE — ED Notes (Signed)
Patient to the unit - active SI with plan to OD. Denies HI and AVH - will continue to monitor for safety

## 2022-01-19 ENCOUNTER — Encounter (HOSPITAL_COMMUNITY): Payer: Self-pay | Admitting: Emergency Medicine

## 2022-01-19 ENCOUNTER — Emergency Department (HOSPITAL_COMMUNITY)
Admission: EM | Admit: 2022-01-19 | Discharge: 2022-01-21 | Disposition: A | Discharge status: Other Acute Inpatient Hospital | Payer: Medicaid Other | Attending: Emergency Medicine | Admitting: Emergency Medicine

## 2022-01-19 ENCOUNTER — Other Ambulatory Visit: Payer: Self-pay

## 2022-01-19 DIAGNOSIS — R45851 Suicidal ideations: Secondary | ICD-10-CM | POA: Insufficient documentation

## 2022-01-19 DIAGNOSIS — F1924 Other psychoactive substance dependence with psychoactive substance-induced mood disorder: Secondary | ICD-10-CM | POA: Insufficient documentation

## 2022-01-19 DIAGNOSIS — Z20822 Contact with and (suspected) exposure to covid-19: Secondary | ICD-10-CM | POA: Insufficient documentation

## 2022-01-19 DIAGNOSIS — F1994 Other psychoactive substance use, unspecified with psychoactive substance-induced mood disorder: Secondary | ICD-10-CM | POA: Diagnosis present

## 2022-01-19 LAB — CBC
HCT: 42.4 % (ref 39.0–52.0)
Hemoglobin: 13.9 g/dL (ref 13.0–17.0)
MCH: 23.9 pg — ABNORMAL LOW (ref 26.0–34.0)
MCHC: 32.8 g/dL (ref 30.0–36.0)
MCV: 73 fL — ABNORMAL LOW (ref 80.0–100.0)
Platelets: 311 10*3/uL (ref 150–400)
RBC: 5.81 MIL/uL (ref 4.22–5.81)
RDW: 16 % — ABNORMAL HIGH (ref 11.5–15.5)
WBC: 7.4 10*3/uL (ref 4.0–10.5)
nRBC: 0 % (ref 0.0–0.2)

## 2022-01-19 NOTE — ED Triage Notes (Signed)
Pt reported to ED endorsing suicidal ideation stating he would like to run out into oncoming traffic as means of ending his life. Reports hx of the same.

## 2022-01-20 DIAGNOSIS — F1994 Other psychoactive substance use, unspecified with psychoactive substance-induced mood disorder: Secondary | ICD-10-CM

## 2022-01-20 DIAGNOSIS — R45851 Suicidal ideations: Secondary | ICD-10-CM

## 2022-01-20 LAB — COMPREHENSIVE METABOLIC PANEL
ALT: 19 U/L (ref 0–44)
AST: 26 U/L (ref 15–41)
Albumin: 4.3 g/dL (ref 3.5–5.0)
Alkaline Phosphatase: 85 U/L (ref 38–126)
Anion gap: 10 (ref 5–15)
BUN: 17 mg/dL (ref 6–20)
CO2: 24 mmol/L (ref 22–32)
Calcium: 9.3 mg/dL (ref 8.9–10.3)
Chloride: 106 mmol/L (ref 98–111)
Creatinine, Ser: 0.98 mg/dL (ref 0.61–1.24)
GFR, Estimated: >60 mL/min (ref >60)
Glucose, Bld: 160 mg/dL — ABNORMAL HIGH (ref 70–99)
Potassium: 3.3 mmol/L — ABNORMAL LOW (ref 3.5–5.1)
Sodium: 140 mmol/L (ref 135–145)
Total Bilirubin: 0.8 mg/dL (ref 0.3–1.2)
Total Protein: 7.9 g/dL (ref 6.5–8.1)

## 2022-01-20 LAB — ACETAMINOPHEN LEVEL: Acetaminophen (Tylenol), Serum: <10 ug/mL — ABNORMAL LOW (ref 10–30)

## 2022-01-20 LAB — SALICYLATE LEVEL: Salicylate Lvl: <7 mg/dL — ABNORMAL LOW (ref 7.0–30.0)

## 2022-01-20 LAB — ETHANOL: Alcohol, Ethyl (B): <10 mg/dL (ref <10)

## 2022-01-20 LAB — RESP PANEL BY RT-PCR (FLU A&B, COVID) ARPGX2
Influenza A by PCR: NEGATIVE
Influenza B by PCR: NEGATIVE
SARS Coronavirus 2 by RT PCR: NEGATIVE

## 2022-01-20 MED ORDER — VENLAFAXINE HCL ER 75 MG PO CP24
75.0000 mg | ORAL_CAPSULE | Freq: Every day | ORAL | Status: DC
  Filled 2022-01-20: qty 1

## 2022-01-20 MED ORDER — POTASSIUM CHLORIDE CRYS ER 20 MEQ PO TBCR
40.0000 meq | EXTENDED_RELEASE_TABLET | Freq: Once | ORAL | Status: AC
  Administered 2022-01-20: 40 meq via ORAL
  Filled 2022-01-20: qty 2

## 2022-01-20 NOTE — Consult Note (Signed)
BH ED ASSESSMENT   Reason for Consult: Psychiatric Consult  Referring Physician:  Carroll Sage, PA-C  Patient Identification: Casey Morris MRN:  [id number] ED Chief Complaint: Substance induced mood disorder (HCC)  Diagnosis:  Principal Problem:   Substance induced mood disorder (HCC) Active Problems:   Suicidal ideation   ED Assessment Time Calculation: Start Time: 1000 Stop Time: 1030 Total Time in Minutes (Assessment Completion): 30  Casey Morris, 56 y.o., male patient seen face to face by this provider, consulted with Dr. Lucianne Muss; and chart reviewed on 01/20/22.    Subjective:   Casey Morris is a 56 y.o. male patient admitted voluntarily to  [facility] on 01/20/22 with symptoms of worsening depression and ongoing passive ideations.  HPI:  Casey Morris 56 y.o., male patient with a history of cocaine abuse, major depression, hx CVA, presented today due to concerns of worsening symptoms.  Patient reports that he is frustrated with life due to financial struggles despite going to work daily.  Patient lives alone and has not been generally from [place] family locally here in [place] working.  He is unable to identify any triggers the depression which began the latter part of the summer.  He has been reluctant to start medication as he has never taken any psychotropic medications.  However given his recent symptoms of depression, hopelessness, lack of energy, increased sleep, he feels this time to start a medication.  He reports ongoing suicidal ideations and has never had a prior attempt.  He currently has no plan however he did not bulge to the pain that he were to commit suicide he may do it by walking into traffic however he does not have a specific plan.  He is unable to contract for safety.  He endorses use of cocaine as this is the only thing medication etiology and makes him feel better so that he can continue to work.  He reports when he does not use he lays around which  subsequently worsens his level of depression.  During evaluation Casey Morris is lying down, in no acute distress. He is alert, oriented x 4, calm, cooperative and attentive. His mood is depressed, hopeless, with congruent affect. He has normal speech, and behavior.  Objectively there is no evidence of psychosis/mania or delusional thinking.  Patient is able to converse coherently, goal directed thoughts, no distractibility, or pre-occupation.  He endorses suicidal ideations without a plan. Denies homicidal ideations, auditory or visual hallucinations and is not demonstrating any objective signs of psychosis, and paranoia.  Patient answered question appropriately.  Patient is unable to contract for safety.   Risk to Self or Others: Is the patient at risk to self? Yes Has the patient been a risk to self in the past 6 months? Yes Has the patient been a risk to self within the distant past? Yes Is the patient a risk to others? No Has the patient been a risk to others in the past 6 months? No Has the patient been a risk to others within the distant past? No  [place] Scale:  Flowsheet Row ED from 01/19/2022 in [facility] EMERGENCY DEPARTMENT ED from 12/22/2021 in [facility] EMERGENCY DEPARTMENT ED from 12/15/2021 in [facility] EMERGENCY DEPARTMENT  C-SSRS RISK CATEGORY High Risk Moderate Risk High Risk         Past Medical History:  Past Medical History:  Diagnosis Date   Asthma    Stroke [facility])     Past Surgical History:  Procedure Laterality Date   CARDIAC CATHETERIZATION      Social History:  Social History   Substance and Sexual Activity  Alcohol Use Yes     Social History   Substance and Sexual Activity  Drug Use Yes   Types: Marijuana    Social History   Socioeconomic History   Marital status: Divorced    Spouse name: Not on file   Number of children: Not on file   Years of education: Not on file   Highest  education level: Not on file  Occupational History   Not on file  Tobacco Use   Smoking status: Every Day    Types: Cigarettes   Smokeless tobacco: Never  Substance and Sexual Activity   Alcohol use: Yes   Drug use: Yes    Types: Marijuana   Sexual activity: Not on file  Other Topics Concern   Not on file  Social History Narrative   Not on file   Social Determinants of Health   Financial Resource Strain: Not on file  Food Insecurity: Not on file  Transportation Needs: Not on file  Physical Activity: Not on file  Stress: Not on file  Social Connections: Not on file   Additional Social History:    Allergies:   Allergies  Allergen Reactions   Iodides Hives   Iodinated Contrast Media Hives    Labs:  Results for orders placed or performed during the hospital encounter of 01/19/22 (from the past 48 hour(s))  Comprehensive metabolic panel     Status: Abnormal   Collection Time: 01/19/22 11:25 PM  Result Value Ref Range   Sodium 140 135 - 145 mmol/L   Potassium 3.3 (L) 3.5 - 5.1 mmol/L   Chloride 106 98 - 111 mmol/L   CO2 24 22 - 32 mmol/L   Glucose, Bld 160 (H) 70 - 99 mg/dL    Comment: Glucose reference range applies only to samples taken after fasting for at least 8 hours.   BUN 17 6 - 20 mg/dL   Creatinine, Ser 6.38 0.61 - 1.24 mg/dL   Calcium 9.3 8.9 - 75.6 mg/dL   Total Protein 7.9 6.5 - 8.1 g/dL   Albumin 4.3 3.5 - 5.0 g/dL   AST 26 15 - 41 U/L   ALT 19 0 - 44 U/L   Alkaline Phosphatase 85 38 - 126 U/L   Total Bilirubin 0.8 0.3 - 1.2 mg/dL   GFR, Estimated >43 >32 mL/min    Comment: (NOTE) Calculated using the CKD-EPI Creatinine Equation (2021)    Anion gap 10 5 - 15    Comment: Performed at [facility], [address] [place], [place] [address]  Ethanol     Status: None   Collection Time: 01/19/22 11:25 PM  Result Value Ref Range   Alcohol, Ethyl (B) <10 <10 mg/dL    Comment: (NOTE) Lowest detectable limit for serum alcohol is 10  mg/dL.  For medical purposes only. Performed at [facility], [address], [place], [place] [address]   Salicylate level     Status: Abnormal   Collection Time: 01/19/22 11:25 PM  Result Value Ref Range   Salicylate Lvl <7.0 (L) 7.0 - 30.0 mg/dL    Comment: Performed at [facility], [address] [place], [place] [address]  Acetaminophen level     Status: Abnormal   Collection Time: 01/19/22 11:25 PM  Result Value Ref Range   Acetaminophen (Tylenol), Serum <10 (L) 10 -  30 ug/mL    Comment: (NOTE) Therapeutic concentrations vary significantly. A range of 10-30 ug/mL  may be an effective concentration for many patients. However, some  are best treated at concentrations outside of this range. Acetaminophen concentrations >150 ug/mL at 4 hours after ingestion  and >50 ug/mL at 12 hours after ingestion are often associated with  toxic reactions.  Performed at [facility], [place] [address], [place], [place] [address]   cbc     Status: Abnormal   Collection Time: 01/19/22 11:25 PM  Result Value Ref Range   WBC 7.4 4.0 - 10.5 K/uL   RBC 5.81 4.22 - 5.81 MIL/uL   Hemoglobin 13.9 13.0 - 17.0 g/dL   HCT 42.4 39.0 - 52.0 %   MCV 73.0 (L) 80.0 - 100.0 fL   MCH 23.9 (L) 26.0 - 34.0 pg   MCHC 32.8 30.0 - 36.0 g/dL   RDW 16.0 (H) 11.5 - 15.5 %   Platelets 311 150 - 400 K/uL   nRBC 0.0 0.0 - 0.2 %    Comment: Performed at [facility], [place] [address], [place], [place] [address]    No current facility-administered medications for this encounter.   Current Outpatient Medications  Medication Sig Dispense Refill   acetaminophen (TYLENOL) 500 MG tablet Take 1,000 mg by mouth daily as needed for mild pain or headache.     albuterol (VENTOLIN HFA) 108 (90 Base) MCG/ACT inhaler Inhale 2 puffs into the lungs every 4 (four) hours as needed for wheezing or shortness of breath.     sildenafil (VIAGRA) 100 MG tablet Take 50-100 mg by mouth daily as needed for erectile  dysfunction.      Psychiatric Specialty Exam: Presentation  General Appearance:  Appropriate for Environment  Eye Contact: Good  Speech: Clear and Coherent  Speech Volume: Normal  Handedness: Right   Mood and Affect  Mood: Depressed  Affect: Tearful; Appropriate   Thought Process  Thought Processes: Coherent; Goal Directed  Descriptions of Associations:Intact  Orientation:Full (Time, Place and Person)  Thought Content:Logical  History of Schizophrenia/Schizoaffective disorder:No  Duration of Psychotic Symptoms:No data recorded Hallucinations:Hallucinations: None  Ideas of Reference:None  Suicidal Thoughts:Suicidal Thoughts: Yes, Passive SI Active Intent and/or Plan: Without Plan; Without Intent SI Passive Intent and/or Plan: Without Intent; Without Plan  Homicidal Thoughts:Homicidal Thoughts: No   Sensorium  Memory: Immediate Good; Remote Good; Recent Good  Judgment: Fair  Insight: Fair   Community education officer  Concentration: Good  Attention Span: Fair  Recall: [place] of Knowledge: Good  Language: Good   Psychomotor Activity  Psychomotor Activity: Psychomotor Activity: Normal   Assets  Assets: Communication Skills; Desire for Improvement; Housing; Physical Health; Resilience; Social Support    Sleep  Sleep: Sleep: Poor Number of Hours of Sleep: 0 (Sleeping  excessive when not working due to depression lack of energy)    Physical Exam HENT:     Head: Normocephalic.  Eyes:     Extraocular Movements: Extraocular movements intact.     Pupils: Pupils are equal, round, and reactive to light.  Pulmonary:     Effort: Pulmonary effort is normal.  Musculoskeletal:     Cervical back: Normal range of motion.  Skin:    General: Skin is warm and dry.     Capillary Refill: Capillary refill takes less than 2 seconds.  Neurological:     General: No focal deficit present.     Mental Status: He is alert.  Psychiatric:  Mood and Affect: Mood normal.        Behavior: Behavior normal.    Review of Systems  Psychiatric/Behavioral:  Positive for depression, substance abuse and suicidal ideas. The patient is nervous/anxious.    Blood pressure 116/69, pulse 65, temperature 98 F (36.7 C), temperature source Oral, resp. rate 14, SpO2 99 %. There is no height or weight on file to calculate BMI.  Medical Decision Making: Patient case review and discussed with Dr. Lucianne Muss. Patient reassessed and continues to have active passive SI without a specific plan. Patient meets criteria for admission to [facility] crisis management, safety, and stabilization.  Discussed medication options with patient and patient is agreeable to Effexor 75 mg once daily extended release. Side effects and precautions discussed especially affects all blood pressure. Patient verbalized understanding and agreement with plan.  Patient is unable to contract for safety at this time.     Disposition: Patient accepted for psychiatric admission to Facility Based Crisis unit at [facility].  Joaquin Courts, FNP-C, PMHNP-BC 01/20/2022 3:49 PM

## 2022-01-20 NOTE — ED Provider Notes (Signed)
[facility] EMERGENCY DEPARTMENT Provider Note   CSN: [id number] Arrival date & time: 01/19/22  2315     History  Chief Complaint  Patient presents with   Suicidal    Casey Morris is a 56 y.o. male.  HPI   Medical history including depression, suicidal ideations, polysubstance dependency presents with complaints of suicidal ideations.  He states over the last couple days he has been more depressed, states that he no longer wants to live, would like to run in front of cars and get run over.  He has no other complaints denies any illicit drug use, no chest pain no shortness of breath no stomach pains no nausea vomiting no general body aches.    Home Medications Prior to Admission medications   Medication Sig Start Date End Date Taking? Authorizing Provider  albuterol (VENTOLIN HFA) 108 (90 Base) MCG/ACT inhaler Inhale 2 puffs into the lungs every 4 (four) hours as needed for wheezing or shortness of breath. 06/15/19   [provider]      Allergies    Iodides and Iodinated contrast media    Review of Systems   Review of Systems  Constitutional:  Negative for chills and fever.  Respiratory:  Negative for shortness of breath.   Cardiovascular:  Negative for chest pain.  Gastrointestinal:  Negative for abdominal pain.  Neurological:  Negative for headaches.  Psychiatric/Behavioral:  Positive for suicidal ideas.     Physical Exam Updated Vital Signs BP 116/69 (BP Location: Left Arm)   Pulse 65   Temp 98 F (36.7 C) (Oral)   Resp 14   SpO2 99%  Physical Exam Vitals and nursing note reviewed.  Constitutional:      General: He is not in acute distress.    Appearance: He is not ill-appearing.  HENT:     Head: Normocephalic and atraumatic.     Nose: No congestion.  Eyes:     Conjunctiva/sclera: Conjunctivae normal.  Cardiovascular:     Rate and Rhythm: Normal rate and regular rhythm.     Pulses: Normal pulses.  Pulmonary:     Effort:  Pulmonary effort is normal.  Skin:    General: Skin is warm and dry.  Neurological:     Mental Status: He is alert.     Comments: No facial asymmetry no difficulty with word finding following two-step commands no unilateral weakness present.  Psychiatric:        Mood and Affect: Mood normal.     Comments: Responding appropriately, does not appear to be respond to internal stimuli, endorses suicidal ideation with a plan.     ED Results / Procedures / Treatments   Labs (all labs ordered are listed, but only abnormal results are displayed) Labs Reviewed  COMPREHENSIVE METABOLIC PANEL - Abnormal; Notable for the following components:      Result Value   Potassium 3.3 (*)    Glucose, Bld 160 (*)    All other components within normal limits  SALICYLATE LEVEL - Abnormal; Notable for the following components:   Salicylate Lvl <7.0 (*)    All other components within normal limits  ACETAMINOPHEN LEVEL - Abnormal; Notable for the following components:   Acetaminophen (Tylenol), Serum <10 (*)    All other components within normal limits  CBC - Abnormal; Notable for the following components:   MCV 73.0 (*)    MCH 23.9 (*)    RDW 16.0 (*)    All other components within normal limits  RESP  PANEL BY RT-PCR (FLU A&B, COVID) ARPGX2  ETHANOL  RAPID URINE DRUG SCREEN, HOSP PERFORMED    EKG None  Radiology No results found.  Procedures Procedures    Medications Ordered in ED Medications - No data to display  ED Course/ Medical Decision Making/ A&P                           Medical Decision Making Amount and/or Complexity of Data Reviewed Labs: ordered.   This patient presents to the ED for concern of suicidal ideation, this involves an extensive number of treatment options, and is a complaint that carries with it a high risk of complications and morbidity.  The differential diagnosis includes lack derailment, withdrawals, psychiatric emergency    Additional history  obtained:  Additional history obtained from N/A External records from outside source obtained and reviewed including ER notes   Co morbidities that complicate the patient evaluation  Depression  Social Determinants of Health:  N/A    Lab Tests:  I Ordered, and personally interpreted labs.  The pertinent results include: CBC unremarkable, CMP shows a potassium 3.3 glucose 160 ethanol negative acetaminophen negative   Imaging Studies ordered:  I ordered imaging studies including N/A I independently visualized and interpreted imaging which showed N/A I agree with the radiologist interpretation   Cardiac Monitoring:  The patient was maintained on a cardiac monitor.  I personally viewed and interpreted the cardiac monitored which showed an underlying rhythm of: N/A   Medicines ordered and prescription drug management:  I ordered medication including N/A I have reviewed the patients home medicines and have made adjustments as needed  Critical Interventions:  N/A   Reevaluation:  Medically clear at this time, will consult TTS for further recommendations  Consultations Obtained:  TS pending    Test Considered:  N/a    Rule out I doubt withdrawals as he is nontremulous on my exam vital signs are reassuring, I doubt metabolic derailments as lab work is overall unremarkable    Dispostion and problem list  After consideration of the diagnostic results and the patients response to treatment, I feel that the patent would benefit from psych hold home meds will be ordered if any, TTS consultation pending for ultimate disposition, if the patient decides to leave would recommend reassessing him if no longer suicidal he can follow-up as an outpatient.            Final Clinical Impression(s) / ED Diagnoses Final diagnoses:  Suicidal ideation    Rx / DC Orders ED Discharge Orders     None         Marcello Fennel, PA-C 01/20/22 0533     Fatima Blank, MD 01/20/22 [phone]

## 2022-01-20 NOTE — ED Notes (Addendum)
[facility] left without pt. This RN to set up transport to [facility] a second time

## 2022-01-20 NOTE — ED Notes (Signed)
Spoke with Network engineer and requested safety transport to be called at this time

## 2022-01-20 NOTE — ED Notes (Signed)
Safe transport called to have pt transported to [facility], safe transport to call this RN when they are on the way

## 2022-01-20 NOTE — ED Notes (Signed)
Received verbal report from Chad G RN at this time 

## 2022-01-21 ENCOUNTER — Other Ambulatory Visit (HOSPITAL_COMMUNITY)
Admission: EM | Admit: 2022-01-21 | Discharge: 2022-01-23 | Disposition: A | Discharge status: Home / Self Care | Payer: Self-pay | Attending: Psychiatry | Admitting: Psychiatry

## 2022-01-21 ENCOUNTER — Encounter (HOSPITAL_COMMUNITY): Payer: Self-pay

## 2022-01-21 DIAGNOSIS — F1994 Other psychoactive substance use, unspecified with psychoactive substance-induced mood disorder: Secondary | ICD-10-CM | POA: Diagnosis not present

## 2022-01-21 DIAGNOSIS — F17210 Nicotine dependence, cigarettes, uncomplicated: Secondary | ICD-10-CM | POA: Insufficient documentation

## 2022-01-21 DIAGNOSIS — F1410 Cocaine abuse, uncomplicated: Secondary | ICD-10-CM | POA: Insufficient documentation

## 2022-01-21 DIAGNOSIS — F322 Major depressive disorder, single episode, severe without psychotic features: Secondary | ICD-10-CM | POA: Insufficient documentation

## 2022-01-21 DIAGNOSIS — Z602 Problems related to living alone: Secondary | ICD-10-CM | POA: Insufficient documentation

## 2022-01-21 DIAGNOSIS — Z1152 Encounter for screening for COVID-19: Secondary | ICD-10-CM | POA: Insufficient documentation

## 2022-01-21 DIAGNOSIS — R45851 Suicidal ideations: Secondary | ICD-10-CM | POA: Insufficient documentation

## 2022-01-21 LAB — POCT URINE DRUG SCREEN - MANUAL ENTRY (I-SCREEN)
POC Amphetamine UR: NOT DETECTED
POC Buprenorphine (BUP): NOT DETECTED
POC Cocaine UR: POSITIVE — AB
POC Marijuana UR: NOT DETECTED
POC Methadone UR: NOT DETECTED
POC Methamphetamine UR: NOT DETECTED
POC Morphine: NOT DETECTED
POC Oxazepam (BZO): NOT DETECTED
POC Oxycodone UR: NOT DETECTED
POC Secobarbital (BAR): NOT DETECTED

## 2022-01-21 MED ORDER — MAGNESIUM HYDROXIDE 400 MG/5ML PO SUSP: 30.0000 mL | Freq: Every day | ORAL | Status: DC | PRN

## 2022-01-21 MED ORDER — TRAZODONE HCL 50 MG PO TABS: 50.0000 mg | ORAL_TABLET | Freq: Every evening | ORAL | Status: DC | PRN

## 2022-01-21 MED ORDER — HYDROXYZINE HCL 25 MG PO TABS: 25.0000 mg | ORAL_TABLET | Freq: Three times a day (TID) | ORAL | Status: DC | PRN

## 2022-01-21 MED ORDER — ACETAMINOPHEN 325 MG PO TABS: 650.0000 mg | ORAL_TABLET | Freq: Four times a day (QID) | ORAL | Status: DC | PRN

## 2022-01-21 MED ORDER — ALUM & MAG HYDROXIDE-SIMETH 200-200-20 MG/5ML PO SUSP: 30.0000 mL | ORAL | Status: DC | PRN

## 2022-01-21 MED ORDER — VENLAFAXINE HCL ER 75 MG PO CP24
75.0000 mg | ORAL_CAPSULE | Freq: Every day | ORAL | Status: DC
  Administered 2022-01-21 – 2022-01-23 (×3): 75 mg via ORAL
  Filled 2022-01-21 (×3): qty 1

## 2022-01-21 NOTE — ED Notes (Addendum)
Patient in Physician led group. Respirations equal and unlabored, skin warm and dry, NAD. No change in assessment or acuity. Q 15 minute safety checks remain in place.    

## 2022-01-21 NOTE — ED Provider Notes (Signed)
Facility Based Crisis Admission H&P  Date: 01/21/22 Patient Name: Casey Morris MRN: [id number] Chief Complaint:  Chief Complaint  Patient presents with   Suicidal   Depression   Addiction Problem      Diagnoses:  Final diagnoses:  Substance induced mood disorder ([place])    HPI: Casey Morris is a 56 year old male with psychiatric history of depression and cocaine abuse.  Patient initially presented to [facility] reporting worsening depressive symptoms.  He was evaluated and recommended for admission to [facility] for crisis management.  Patient was seen face-to-face and his chart was reviewed by this NP.  he is sitting in a chair in no apparent distress.  he is alert and oriented x 4.  He is speaking in a normal tone of voice at moderate rate with good eye contact.  Patient's mood is depressed with congruent affect.  Patient's thought process is coherent.  Patient denies current suicidal ideation.  He denies homicidal ideation, hallucination, and paranoia.  He admits to using crack cocaine; last use was on 01/19/22.  He denies all other substance use.  He says he is interested in starting medication to address depressive symptoms as well as substance abuse treatment. Per Casey Barrows, NP patient is agreeable to starting Effexor 75mg  extended release. Once again, discussed starting side effects and benefit of Effexor with patient. Patient is agreeable to starting Effexor 75mg  .  Per Initial Assessment: Patient reports that he is frustrated with life due to financial struggles despite going to work daily.  Patient lives alone and has not been generally from [place] family locally here in [place] working.  He is unable to identify any triggers the depression which began the latter part of the summer.  He has been reluctant to start medication as he has never taken any psychotropic medications.  However given his recent symptoms of depression, hopelessness, lack of energy, increased sleep, he feels  this time to start a medication.  He reports ongoing suicidal ideations and has never had a prior attempt.  He currently has no plan however he did not bulge to the pain that he were to commit suicide he may do it by walking into traffic however he does not have a specific plan.  He is unable to contract for safety.  He endorses use of cocaine as this is the only thing medication etiology and makes him feel better so that he can continue to work.  He reports when he does not use he lays around which subsequently worsens his level of depression.    PHQ 2-9:  [place] from 01/21/2022 in [facility] ED from 12/23/2021 in [facility]  Thoughts that you would be better off dead, or of hurting yourself in some way Several days Not at all  PHQ-9 Total Score 8 2       [place] [facility] from 01/21/2022 in [facility] ED from 01/19/2022 in [place] ED from 12/22/2021 in [facility] CATEGORY Moderate Risk High Risk Moderate Risk        Total Time spent with patient: 20 minutes  Musculoskeletal  Strength & Muscle Tone: within normal limits Gait & Station: normal Patient leans: Right  Psychiatric Specialty Exam  Presentation General Appearance:  Appropriate for Environment  Eye Contact: Good  Speech: Clear and Coherent  Speech Volume: Normal  Handedness: Right   Mood and Affect  Mood: Depressed  Affect: Congruent   Thought Process  Thought Processes: Coherent  Descriptions of Associations:Intact  Orientation:Full (Time, Place and Person)  Thought Content:WDL  Diagnosis of Schizophrenia or Schizoaffective disorder in past: No   Hallucinations:Hallucinations: None  Ideas of Reference:None  Suicidal Thoughts:Suicidal Thoughts: Yes, Passive SI Active Intent and/or Plan: Without Plan; Without  Intent SI Passive Intent and/or Plan: Without Intent; Without Plan  Homicidal Thoughts:Homicidal Thoughts: No   Sensorium  Memory: Immediate Good; Remote Good; Recent Good  Judgment: Fair  Insight: Good   Executive Functions  Concentration: Good  Attention Span: Good  Recall: [place] of Knowledge: Good  Language: Good   Psychomotor Activity  Psychomotor Activity: Psychomotor Activity: Normal   Assets  Assets: Communication Skills; Desire for Improvement; Housing; Transportation; Vocational/Educational   Sleep  Sleep: Sleep: Good Number of Hours of Sleep: 8   Nutritional Assessment (For OBS and FBC admissions only) Has the patient had a weight loss or gain of 10 pounds or more in the last 3 months?: No Has the patient had a decrease in food intake/or appetite?: No Does the patient have dental problems?: No Does the patient have eating habits or behaviors that may be indicators of an eating disorder including binging or inducing vomiting?: No Has the patient recently lost weight without trying?: 0 Has the patient been eating poorly because of a decreased appetite?: 0 Malnutrition Screening Tool Score: 0    Physical Exam Vitals and nursing note reviewed.  Constitutional:      General: He is not in acute distress.    Appearance: He is well-developed.  HENT:     Head: Normocephalic and atraumatic.  Eyes:     Conjunctiva/sclera: Conjunctivae normal.  Cardiovascular:     Rate and Rhythm: Normal rate.  Pulmonary:     Effort: Pulmonary effort is normal.  Abdominal:     Palpations: Abdomen is soft.     Tenderness: There is no abdominal tenderness.  Musculoskeletal:        General: No swelling.     Cervical back: Neck supple.  Skin:    General: Skin is warm and dry.     Capillary Refill: Capillary refill takes less than 2 seconds.  Neurological:     Mental Status: He is alert and oriented to person, place, and time.  Psychiatric:         Attention and Perception: Attention and perception normal.        Mood and Affect: Mood is anxious and depressed.        Speech: Speech normal.        Behavior: Behavior normal.        Thought Content: Thought content normal. Thought content is not paranoid or delusional. Thought content does not include homicidal or suicidal ideation. Thought content does not include homicidal or suicidal plan.        Cognition and Memory: Cognition normal.    Review of Systems  Constitutional: Negative.   HENT: Negative.    Eyes: Negative.   Respiratory: Negative.    Cardiovascular: Negative.   Gastrointestinal: Negative.   Genitourinary: Negative.   Musculoskeletal: Negative.   Skin: Negative.   Neurological: Negative.   Endo/Heme/Allergies: Negative.   Psychiatric/Behavioral:  Positive for depression and substance abuse. The patient is nervous/anxious.     Blood pressure 135/73, pulse 64, temperature 98.2 F (36.8 C), temperature source Oral, resp. rate 18, SpO2 99 %. There is no height or weight on file to calculate BMI.  Past Psychiatric History:  Cocaine abuse, depression   Is the patient at risk to self? No  Has the patient been a risk to self in the past 6 months? Yes .    Has the patient been a risk to self within the distant past? No   Is the patient a risk to others? No   Has the patient been a risk to others in the past 6 months? No   Has the patient been a risk to others within the distant past? No   Past Medical History:  Past Medical History:  Diagnosis Date   Asthma    Stroke [facility])     Past Surgical History:  Procedure Laterality Date   CARDIAC CATHETERIZATION      Family History: No family history on file.  Social History:  Social History   Socioeconomic History   Marital status: Divorced    Spouse name: Not on file   Number of children: Not on file   Years of education: Not on file   Highest education level: Not on file  Occupational History   Not on file   Tobacco Use   Smoking status: Every Day    Types: Cigarettes   Smokeless tobacco: Never  Substance and Sexual Activity   Alcohol use: Yes   Drug use: Yes    Types: Marijuana   Sexual activity: Not on file  Other Topics Concern   Not on file  Social History Narrative   Not on file   Social Determinants of Health   Financial Resource Strain: Not on file  Food Insecurity: Not on file  Transportation Needs: Not on file  Physical Activity: Not on file  Stress: Not on file  Social Connections: Not on file  Intimate Partner Violence: Not on file    SDOH:  SDOH Screenings   Depression (PHQ2-9): Medium Risk (01/21/2022)  Tobacco Use: High Risk (01/19/2022)    Last Labs:  Admission on 01/21/2022  Component Date Value Ref Range Status   POC Amphetamine UR 01/21/2022 None Detected  NONE DETECTED (Cut Off Level 1000 ng/mL) Final   POC Secobarbital (BAR) 01/21/2022 None Detected  NONE DETECTED (Cut Off Level 300 ng/mL) Final   POC Buprenorphine (BUP) 01/21/2022 None Detected  NONE DETECTED (Cut Off Level 10 ng/mL) Final   POC Oxazepam (BZO) 01/21/2022 None Detected  NONE DETECTED (Cut Off Level 300 ng/mL) Final   POC Cocaine UR 01/21/2022 Positive (A)  NONE DETECTED (Cut Off Level 300 ng/mL) Final   POC Methamphetamine UR 01/21/2022 None Detected  NONE DETECTED (Cut Off Level 1000 ng/mL) Final   POC Morphine 01/21/2022 None Detected  NONE DETECTED (Cut Off Level 300 ng/mL) Final   POC Methadone UR 01/21/2022 None Detected  NONE DETECTED (Cut Off Level 300 ng/mL) Final   POC Oxycodone UR 01/21/2022 None Detected  NONE DETECTED (Cut Off Level 100 ng/mL) Final   POC Marijuana UR 01/21/2022 None Detected  NONE DETECTED (Cut Off Level 50 ng/mL) Final  Admission on 01/19/2022, Discharged on 01/21/2022  Component Date Value Ref Range Status   Sodium 01/19/2022 140  135 - 145 mmol/L Final   Potassium 01/19/2022 3.3 (L)  3.5 - 5.1 mmol/L Final   Chloride 01/19/2022 106  98 - 111 mmol/L  Final   CO2 01/19/2022 24  22 - 32 mmol/L Final   Glucose, Bld 01/19/2022 160 (H)  70 - 99 mg/dL Final   Glucose reference range applies only to samples taken after fasting for at least 8 hours.  BUN 01/19/2022 17  6 - 20 mg/dL Final   Creatinine, Ser 01/19/2022 0.98  0.61 - 1.24 mg/dL Final   Calcium 01/19/2022 9.3  8.9 - 10.3 mg/dL Final   Total Protein 01/19/2022 7.9  6.5 - 8.1 g/dL Final   Albumin 01/19/2022 4.3  3.5 - 5.0 g/dL Final   AST 01/19/2022 26  15 - 41 U/L Final   ALT 01/19/2022 19  0 - 44 U/L Final   Alkaline Phosphatase 01/19/2022 85  38 - 126 U/L Final   Total Bilirubin 01/19/2022 0.8  0.3 - 1.2 mg/dL Final   GFR, Estimated 01/19/2022 >60  >60 mL/min Final   Comment: (NOTE) Calculated using the CKD-EPI Creatinine Equation (2021)    Anion gap 01/19/2022 10  5 - 15 Final   Performed at [facility] [address], [place], [place] [address]   Alcohol, Ethyl (B) 01/19/2022 <10  <10 mg/dL Final   Comment: (NOTE) Lowest detectable limit for serum alcohol is 10 mg/dL.  For medical purposes only. Performed at [facility], [place] [address] [place], [place] [id number]    Salicylate Lvl [id number] <7.0 (L)  7.0 - 30.0 mg/dL Final   Performed at [facility] [address], [place], [place] [address]   Acetaminophen (Tylenol), Serum 01/19/2022 <10 (L)  10 - 30 ug/mL Final   Comment: (NOTE) Therapeutic concentrations vary significantly. A range of 10-30 ug/mL  may be an effective concentration for many patients. However, some  are best treated at concentrations outside of this range. Acetaminophen concentrations >150 ug/mL at 4 hours after ingestion  and >50 ug/mL at 12 hours after ingestion are often associated with  toxic reactions.  Performed at [facility], [place] [address], [place], [place] [address]    WBC 01/19/2022 7.4  4.0 - 10.5 K/uL Final   RBC 01/19/2022 5.81  4.22 - 5.81 MIL/uL Final   Hemoglobin 01/19/2022 13.9  13.0 - 17.0 g/dL  Final   HCT 01/19/2022 42.4  39.0 - 52.0 % Final   MCV 01/19/2022 73.0 (L)  80.0 - 100.0 fL Final   MCH 01/19/2022 23.9 (L)  26.0 - 34.0 pg Final   MCHC 01/19/2022 32.8  30.0 - 36.0 g/dL Final   RDW 01/19/2022 16.0 (H)  11.5 - 15.5 % Final   Platelets 01/19/2022 311  150 - 400 K/uL Final   nRBC 01/19/2022 0.0  0.0 - 0.2 % Final   Performed at [facility], [place] [address] [place], [place] [address]   SARS Coronavirus 2 by RT PCR 01/20/2022 NEGATIVE  NEGATIVE Final   Comment: (NOTE) SARS-CoV-2 target nucleic acids are NOT DETECTED.  The SARS-CoV-2 RNA is generally detectable in upper respiratory specimens during the acute phase of infection. The lowest concentration of SARS-CoV-2 viral copies this assay can detect is 138 copies/mL. A negative result does not preclude SARS-Cov-2 infection and should not be used as the sole basis for treatment or other patient management decisions. A negative result may occur with  improper specimen collection/handling, submission of specimen other than nasopharyngeal swab, presence of viral mutation(s) within the areas targeted by this assay, and inadequate number of viral copies(<138 copies/mL). A negative result must be combined with clinical observations, patient history, and epidemiological information. The expected result is Negative.  Fact Sheet for Patients:  EntrepreneurPulse.com.au  Fact Sheet for Healthcare Providers:  IncredibleEmployment.be  This test is no  t yet approved or cleared by the [place] and  has been authorized for detection and/or diagnosis of SARS-CoV-2 by FDA under an Emergency Use Authorization (EUA). This EUA will remain  in effect (meaning this test can be used) for the duration of the COVID-19 declaration under Section 564(b)(1) of the Act, 21 U.S.C.section 360bbb-3(b)(1), unless the authorization is terminated  or revoked sooner.        Influenza A by PCR 01/20/2022 NEGATIVE  NEGATIVE Final   Influenza B by PCR 01/20/2022 NEGATIVE  NEGATIVE Final   Comment: (NOTE) The Xpert Xpress SARS-CoV-2/FLU/RSV plus assay is intended as an aid in the diagnosis of influenza from Nasopharyngeal swab specimens and should not be used as a sole basis for treatment. Nasal washings and aspirates are unacceptable for Xpert Xpress SARS-CoV-2/FLU/RSV testing.  Fact Sheet for Patients: EntrepreneurPulse.com.au  Fact Sheet for Healthcare Providers: IncredibleEmployment.be  This test is not yet approved or cleared by the [place] FDA and has been authorized for detection and/or diagnosis of SARS-CoV-2 by FDA under an Emergency Use Authorization (EUA). This EUA will remain in effect (meaning this test can be used) for the duration of the COVID-19 declaration under Section 564(b)(1) of the Act, 21 U.S.C. section 360bbb-3(b)(1), unless the authorization is terminated or revoked.  Performed at [facility], [place] [address] [place], [place] [address]   Admission on 12/22/2021, Discharged on 12/23/2021  Component Date Value Ref Range Status   SARS Coronavirus 2 by RT PCR 12/22/2021 NEGATIVE  NEGATIVE Final   Comment: (NOTE) SARS-CoV-2 target nucleic acids are NOT DETECTED.  The SARS-CoV-2 RNA is generally detectable in upper respiratory specimens during the acute phase of infection. The lowest concentration of SARS-CoV-2 viral copies this assay can detect is 138 copies/mL. A negative result does not preclude SARS-Cov-2 infection and should not be used as the sole basis for treatment or other patient management decisions. A negative result may occur with  improper specimen collection/handling, submission of specimen other than nasopharyngeal swab, presence of viral mutation(s) within the areas targeted by this assay, and inadequate number of viral copies(<138 copies/mL). A negative result must  be combined with clinical observations, patient history, and epidemiological information. The expected result is Negative.  Fact Sheet for Patients:  EntrepreneurPulse.com.au  Fact Sheet for Healthcare Providers:  IncredibleEmployment.be  This test is no                          t yet approved or cleared by the [place] FDA and  has been authorized for detection and/or diagnosis of SARS-CoV-2 by FDA under an Emergency Use Authorization (EUA). This EUA will remain  in effect (meaning this test can be used) for the duration of the COVID-19 declaration under Section 564(b)(1) of the Act, 21 U.S.C.section 360bbb-3(b)(1), unless the authorization is terminated  or revoked sooner.       Influenza A by PCR 12/22/2021 NEGATIVE  NEGATIVE Final   Influenza B by PCR 12/22/2021 NEGATIVE  NEGATIVE Final   Comment: (NOTE) The Xpert Xpress SARS-CoV-2/FLU/RSV plus assay is intended as an aid in the diagnosis of influenza from Nasopharyngeal swab specimens and should not be used as a sole basis for treatment. Nasal washings and aspirates are unacceptable for Xpert Xpress SARS-CoV-2/FLU/RSV testing.  Fact Sheet for Patients: EntrepreneurPulse.com.au  Fact Sheet for Healthcare Providers: IncredibleEmployment.be  This test is not yet approved or cleared by the [place] and has been authorized for  detection and/or diagnosis of SARS-CoV-2 by FDA under an Emergency Use Authorization (EUA). This EUA will remain in effect (meaning this test can be used) for the duration of the COVID-19 declaration under Section 564(b)(1) of the Act, 21 U.S.C. section 360bbb-3(b)(1), unless the authorization is terminated or revoked.  Performed at [facility], [place] [address], [place], [place] [address]    Sodium 12/22/2021 142  135 - 145 mmol/L Final   Potassium 12/22/2021 3.6  3.5 - 5.1 mmol/L Final   Chloride  12/22/2021 108  98 - 111 mmol/L Final   CO2 12/22/2021 23  22 - 32 mmol/L Final   Glucose, Bld 12/22/2021 93  70 - 99 mg/dL Final   Glucose reference range applies only to samples taken after fasting for at least 8 hours.   BUN 12/22/2021 18  6 - 20 mg/dL Final   Creatinine, Ser 12/22/2021 0.89  0.61 - 1.24 mg/dL Final   Calcium 12/22/2021 9.2  8.9 - 10.3 mg/dL Final   Total Protein 12/22/2021 7.5  6.5 - 8.1 g/dL Final   Albumin 12/22/2021 4.2  3.5 - 5.0 g/dL Final   AST 12/22/2021 24  15 - 41 U/L Final   ALT 12/22/2021 18  0 - 44 U/L Final   Alkaline Phosphatase 12/22/2021 80  38 - 126 U/L Final   Total Bilirubin 12/22/2021 1.0  0.3 - 1.2 mg/dL Final   GFR, Estimated 12/22/2021 >60  >60 mL/min Final   Comment: (NOTE) Calculated using the CKD-EPI Creatinine Equation (2021)    Anion gap 12/22/2021 11  5 - 15 Final   Performed at [facility] [address], [place], [place] [address]   Alcohol, Ethyl (B) 12/22/2021 <10  <10 mg/dL Final   Comment: (NOTE) Lowest detectable limit for serum alcohol is 10 mg/dL.  For medical purposes only. Performed at [facility], [place] [address], [place], [place] [address]    Opiates 12/22/2021 NONE DETECTED  NONE DETECTED Final   Cocaine 12/22/2021 POSITIVE (A)  NONE DETECTED Final   Benzodiazepines 12/22/2021 NONE DETECTED  NONE DETECTED Final   Amphetamines 12/22/2021 NONE DETECTED  NONE DETECTED Final   Tetrahydrocannabinol 12/22/2021 NONE DETECTED  NONE DETECTED Final   Barbiturates 12/22/2021 NONE DETECTED  NONE DETECTED Final   Comment: (NOTE) DRUG SCREEN FOR MEDICAL PURPOSES ONLY.  IF CONFIRMATION IS NEEDED FOR ANY PURPOSE, NOTIFY LAB WITHIN 5 DAYS.  LOWEST DETECTABLE LIMITS FOR URINE DRUG SCREEN Drug Class                     Cutoff (ng/mL) Amphetamine and metabolites    1000 Barbiturate and metabolites    200 Benzodiazepine                 [id number] Tricyclics and metabolites     300 Opiates and metabolites         300 Cocaine and metabolites        300 THC                            50 Performed at [facility], [place] [address] [place], [place] [address]    WBC 12/22/2021 6.7  4.0 - 10.5 K/uL Final   RBC 12/22/2021 5.61  4.22 - 5.81 MIL/uL Final   Hemoglobin 12/22/2021 13.2  13.0 - 17.0 g/dL Final   HCT 12/22/2021 41.5  39.0 - 52.0 % Final   MCV  12/22/2021 74.0 (L)  80.0 - 100.0 fL Final   MCH 12/22/2021 23.5 (L)  26.0 - 34.0 pg Final   MCHC 12/22/2021 31.8  30.0 - 36.0 g/dL Final   RDW 12/22/2021 16.8 (H)  11.5 - 15.5 % Final   Platelets 12/22/2021 269  150 - 400 K/uL Final   nRBC 12/22/2021 0.0  0.0 - 0.2 % Final   Neutrophils Relative % 12/22/2021 51  % Final   Neutro Abs 12/22/2021 3.4  1.7 - 7.7 K/uL Final   Lymphocytes Relative 12/22/2021 37  % Final   Lymphs Abs 12/22/2021 2.5  0.7 - 4.0 K/uL Final   Monocytes Relative 12/22/2021 10  % Final   Monocytes Absolute 12/22/2021 0.7  0.1 - 1.0 K/uL Final   Eosinophils Relative 12/22/2021 2  % Final   Eosinophils Absolute 12/22/2021 0.1  0.0 - 0.5 K/uL Final   Basophils Relative 12/22/2021 0  % Final   Basophils Absolute 12/22/2021 0.0  0.0 - 0.1 K/uL Final   Immature Granulocytes 12/22/2021 0  % Final   Abs Immature Granulocytes 12/22/2021 0.02  0.00 - 0.07 K/uL Final   Performed at [facility], [place] [address] [place], [place] [address]  Admission on 12/15/2021, Discharged on 12/16/2021  Component Date Value Ref Range Status   WBC 12/15/2021 7.0  4.0 - 10.5 K/uL Final   RBC 12/15/2021 5.60  4.22 - 5.81 MIL/uL Final   Hemoglobin 12/15/2021 13.4  13.0 - 17.0 g/dL Final   HCT 12/15/2021 40.7  39.0 - 52.0 % Final   MCV 12/15/2021 72.7 (L)  80.0 - 100.0 fL Final   MCH 12/15/2021 23.9 (L)  26.0 - 34.0 pg Final   MCHC 12/15/2021 32.9  30.0 - 36.0 g/dL Final   RDW 12/15/2021 16.8 (H)  11.5 - 15.5 % Final   Platelets 12/15/2021 252  150 - 400 K/uL Final   nRBC 12/15/2021 0.0  0.0 - 0.2 % Final   Neutrophils Relative % 12/15/2021  58  % Final   Neutro Abs 12/15/2021 4.0  1.7 - 7.7 K/uL Final   Lymphocytes Relative 12/15/2021 30  % Final   Lymphs Abs 12/15/2021 2.1  0.7 - 4.0 K/uL Final   Monocytes Relative 12/15/2021 11  % Final   Monocytes Absolute 12/15/2021 0.8  0.1 - 1.0 K/uL Final   Eosinophils Relative 12/15/2021 0  % Final   Eosinophils Absolute 12/15/2021 0.0  0.0 - 0.5 K/uL Final   Basophils Relative 12/15/2021 1  % Final   Basophils Absolute 12/15/2021 0.0  0.0 - 0.1 K/uL Final   Immature Granulocytes 12/15/2021 0  % Final   Abs Immature Granulocytes 12/15/2021 0.01  0.00 - 0.07 K/uL Final   Performed at [facility], [place] [address], [place], [place] [address]   Sodium 12/15/2021 141  135 - 145 mmol/L Final   Potassium 12/15/2021 3.6  3.5 - 5.1 mmol/L Final   Chloride 12/15/2021 110  98 - 111 mmol/L Final   CO2 12/15/2021 24  22 - 32 mmol/L Final   Glucose, Bld 12/15/2021 102 (H)  70 - 99 mg/dL Final   Glucose reference range applies only to samples taken after fasting for at least 8 hours.   BUN 12/15/2021 21 (H)  6 - 20 mg/dL Final   Creatinine, Ser 12/15/2021 0.80  0.61 - 1.24 mg/dL Final   Calcium 12/15/2021 9.1  8.9 - 10.3 mg/dL Final   Total Protein 12/15/2021 7.7  6.5 - 8.1 g/dL  Final   Albumin 12/15/2021 4.3  3.5 - 5.0 g/dL Final   AST 12/15/2021 26  15 - 41 U/L Final   ALT 12/15/2021 20  0 - 44 U/L Final   Alkaline Phosphatase 12/15/2021 90  38 - 126 U/L Final   Total Bilirubin 12/15/2021 0.7  0.3 - 1.2 mg/dL Final   GFR, Estimated 12/15/2021 >60  >60 mL/min Final   Comment: (NOTE) Calculated using the CKD-EPI Creatinine Equation (2021)    Anion gap 12/15/2021 7  5 - 15 Final   Performed at [facility], [place] [address], [place], [place] [address]   Troponin I (High Sensitivity) 12/15/2021 9  <18 ng/L Final   Comment: (NOTE) Elevated high sensitivity troponin I (hsTnI) values and significant  changes across serial measurements may suggest ACS but many other  chronic and acute  conditions are known to elevate hsTnI results.  Refer to the "Links" section for chest pain algorithms and additional  guidance. Performed at [facility], [place] [address] [place], [place] [address]    Troponin I (High Sensitivity) 12/15/2021 12  <18 ng/L Final   Comment: (NOTE) Elevated high sensitivity troponin I (hsTnI) values and significant  changes across serial measurements may suggest ACS but many other  chronic and acute conditions are known to elevate hsTnI results.  Refer to the "Links" section for chest pain algorithms and additional  guidance. Performed at [facility], [place] [address], [place], [place] [address]    Alcohol, Ethyl (B) 12/15/2021 <10  <10 mg/dL Final   Comment: (NOTE) Lowest detectable limit for serum alcohol is 10 mg/dL.  For medical purposes only. Performed at [facility], [place] [address], [place], [place] [address]    Opiates 12/15/2021 NONE DETECTED  NONE DETECTED Final   Cocaine 12/15/2021 POSITIVE (A)  NONE DETECTED Final   Benzodiazepines 12/15/2021 NONE DETECTED  NONE DETECTED Final   Amphetamines 12/15/2021 NONE DETECTED  NONE DETECTED Final   Tetrahydrocannabinol 12/15/2021 NONE DETECTED  NONE DETECTED Final   Barbiturates 12/15/2021 NONE DETECTED  NONE DETECTED Final   Comment: (NOTE) DRUG SCREEN FOR MEDICAL PURPOSES ONLY.  IF CONFIRMATION IS NEEDED FOR ANY PURPOSE, NOTIFY LAB WITHIN 5 DAYS.  LOWEST DETECTABLE LIMITS FOR URINE DRUG SCREEN Drug Class                     Cutoff (ng/mL) Amphetamine and metabolites    1000 Barbiturate and metabolites    200 Benzodiazepine                 [id number] Tricyclics and metabolites     300 Opiates and metabolites        300 Cocaine and metabolites        300 THC                            50 Performed at [facility], [place] [address] [place], [place] [address]    SARS Coronavirus 2 by RT PCR 12/15/2021 NEGATIVE  NEGATIVE Final   Comment: (NOTE) SARS-CoV-2 target nucleic  acids are NOT DETECTED.  The SARS-CoV-2 RNA is generally detectable in upper respiratory specimens during the acute phase of infection. The lowest concentration of SARS-CoV-2 viral copies this assay can detect is 138 copies/mL. A negative result does not preclude SARS-Cov-2 infection and should not be used as the sole basis for treatment or other patient management decisions.  A negative result may occur with  improper specimen collection/handling, submission of specimen other than nasopharyngeal swab, presence of viral mutation(s) within the areas targeted by this assay, and inadequate number of viral copies(<138 copies/mL). A negative result must be combined with clinical observations, patient history, and epidemiological information. The expected result is Negative.  Fact Sheet for Patients:  EntrepreneurPulse.com.au  Fact Sheet for Healthcare Providers:  IncredibleEmployment.be  This test is no                          t yet approved or cleared by the [place] FDA and  has been authorized for detection and/or diagnosis of SARS-CoV-2 by FDA under an Emergency Use Authorization (EUA). This EUA will remain  in effect (meaning this test can be used) for the duration of the COVID-19 declaration under Section 564(b)(1) of the Act, 21 U.S.C.section 360bbb-3(b)(1), unless the authorization is terminated  or revoked sooner.       Influenza A by PCR 12/15/2021 NEGATIVE  NEGATIVE Final   Influenza B by PCR 12/15/2021 NEGATIVE  NEGATIVE Final   Comment: (NOTE) The Xpert Xpress SARS-CoV-2/FLU/RSV plus assay is intended as an aid in the diagnosis of influenza from Nasopharyngeal swab specimens and should not be used as a sole basis for treatment. Nasal washings and aspirates are unacceptable for Xpert Xpress SARS-CoV-2/FLU/RSV testing.  Fact Sheet for Patients: EntrepreneurPulse.com.au  Fact Sheet for Healthcare  Providers: IncredibleEmployment.be  This test is not yet approved or cleared by the [place] FDA and has been authorized for detection and/or diagnosis of SARS-CoV-2 by FDA under an Emergency Use Authorization (EUA). This EUA will remain in effect (meaning this test can be used) for the duration of the COVID-19 declaration under Section 564(b)(1) of the Act, 21 U.S.C. section 360bbb-3(b)(1), unless the authorization is terminated or revoked.  Performed at [facility], [place] [address], [place], [place] [address]     Allergies: Iodides and Iodinated contrast media  PTA Medications: (Not in a hospital admission)   Long Term Goals: Improvement in symptoms so as ready for discharge  Short Term Goals: Patient will verbalize feelings in meetings with treatment team members., Patient will attend at least of 50% of the groups daily., Pt will complete the PHQ9 on admission, day 3 and discharge., Patient will participate in completing the [place], Patient will score a low risk of violence for 24 hours prior to discharge, and Patient will take medications as prescribed daily.  Medical Decision Making  Patient will be admitted to [facility] for crisis management, stabilization, and substance abuse treatment.   Review lab results  Start Effexor-XR 75mg /day     Recommendations  Based on my evaluation the patient does not appear to have an emergency medical condition.  Ophelia Shoulder, NP 01/21/22  5:04 AM

## 2022-01-21 NOTE — ED Notes (Signed)
Eating breakfast 

## 2022-01-21 NOTE — ED Notes (Signed)
Pt asleep in bed. Respirations even and unlabored. Monitoring for safety. 

## 2022-01-21 NOTE — ED Notes (Signed)
Pt is in the bed sleeping. Respirations are even and unlabored. No acute distress noted. Will continue to monitor for safety. 

## 2022-01-21 NOTE — ED Notes (Addendum)
Gave Pt. Scrubs because the ones he was wearing was falling off. Also got Pt. Food and a drink. He is now sitting in the dining room watching TV.

## 2022-01-21 NOTE — BH IP Treatment Plan (Unsigned)
Interdisciplinary Treatment and Diagnostic Plan Update  01/21/2022 Time of Session: 1035 Casey Morris MRN: [id number]  Diagnosis:  Final diagnoses:  Substance induced mood disorder (HCC)     Current Medications:  Current Facility-Administered Medications  Medication Dose Route Frequency Provider Last Rate Last Admin   acetaminophen (TYLENOL) tablet 650 mg  650 mg Oral Q6H PRN Ajibola, Ene A, NP       alum & mag hydroxide-simeth (MAALOX/MYLANTA) 200-200-20 MG/5ML suspension 30 mL  30 mL Oral Q4H PRN Ajibola, Ene A, NP       hydrOXYzine (ATARAX) tablet 25 mg  25 mg Oral TID PRN Ajibola, Ene A, NP       magnesium hydroxide (MILK OF MAGNESIA) suspension 30 mL  30 mL Oral Daily PRN Ajibola, Ene A, NP       traZODone (DESYREL) tablet 50 mg  50 mg Oral QHS PRN Ajibola, Ene A, NP       venlafaxine XR (EFFEXOR-XR) 24 hr capsule 75 mg  75 mg Oral Daily Ajibola, Ene A, NP   75 mg at 01/21/22 0911   Current Outpatient Medications  Medication Sig Dispense Refill   acetaminophen (TYLENOL) 500 MG tablet Take 1,000 mg by mouth daily as needed for mild pain or headache.     albuterol (VENTOLIN HFA) 108 (90 Base) MCG/ACT inhaler Inhale 2 puffs into the lungs every 4 (four) hours as needed for wheezing or shortness of breath.     sildenafil (VIAGRA) 100 MG tablet Take 50-100 mg by mouth daily as needed for erectile dysfunction.     PTA Medications: Prior to Admission medications   Medication Sig Start Date End Date Taking? Authorizing Provider  acetaminophen (TYLENOL) 500 MG tablet Take 1,000 mg by mouth daily as needed for mild pain or headache.    [provider]  albuterol (VENTOLIN HFA) 108 (90 Base) MCG/ACT inhaler Inhale 2 puffs into the lungs every 4 (four) hours as needed for wheezing or shortness of breath. 06/15/19   [provider]  sildenafil (VIAGRA) 100 MG tablet Take 50-100 mg by mouth daily as needed for erectile dysfunction.    [provider]    Patient  Stressors: Substance abuse   Other: Moving out of the [facility] due to issues being mishandled.    Patient Strengths: Ability for insight  Average or above average intelligence  Capable of independent living  Arboriculturist fund of knowledge  Motivation for treatment/growth  Work skills   Treatment Modalities: Medication Management, Group therapy, Case management,  1 to 1 session with clinician, Psychoeducation, Recreational therapy.   Physician Treatment Plan for Primary and Secondary Diagnosis:  Final diagnoses:  Substance induced mood disorder (HCC)   Long Term Goal(s): Improvement in symptoms so as ready for discharge  Short Term Goals: Patient will verbalize feelings in meetings with treatment team members. Patient will attend at least of 50% of the groups daily. Pt will complete the PHQ9 on admission, day 3 and discharge. Patient will participate in completing the [place] Suicide Severity Rating Scale Patient will score a low risk of violence for 24 hours prior to discharge Patient will take medications as prescribed daily.  Medication Management: Evaluate patient's response, side effects, and tolerance of medication regimen.  Therapeutic Interventions: 1 to 1 sessions, Unit Group sessions and Medication administration.  Evaluation of Outcomes: Progressing  LCSW Treatment Plan for Primary Diagnosis:  Final diagnoses:  Substance induced mood disorder (HCC)    Long Term Goal(s):  Safe transition to appropriate next level of care at discharge.  Short Term Goals: Facilitate acceptance of mental health diagnosis and concerns through verbal commitment to aftercare plan and appointments at discharge., Patient will identify one social support prior to discharge to aid in patient's recovery., Patient will attend AA/NA groups as scheduled., Identify minimum of 2 triggers associated with mental health/substance abuse issues with treatment team  members., and Increase skills for wellness and recovery by attending 50% of scheduled groups.  Therapeutic Interventions: Assess for all discharge needs, 1 to 1 time with Education officer, museum, Explore available resources and support systems, Assess for adequacy in community support network, Educate family and significant other(s) on suicide prevention, Complete Psychosocial Assessment, Interpersonal group therapy.  Evaluation of Outcomes: Progressing   Progress in Treatment: Attending groups: Just arrived on the [facility] unit. Participating in groups: Just arrived on the [facility] unit. Taking medication as prescribed: Yes. Toleration medication: Yes. Family/Significant other contact made: No, will contact:  No one has been identified. Patient understands diagnosis: Yes. Discussing patient identified problems/goals with staff: Yes. Medical problems stabilized or resolved: Yes. Denies suicidal/homicidal ideation: Admitted to [place] with plan to walk into traffic. Issues/concerns per patient self-inventory: Concerned about side effects of medications. Other: Nothing at this time.  New problem(s) identified: Yes, Describe:  History of stroke in 2010.  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  Discharge Plan or Barriers: Refer to either basic outpatient therapy and medication management or CD-IOP.  Reason for Continuation of Hospitalization: Suicidal ideation Withdrawal symptoms  Estimated Length of Stay:  Last 3 [place] Suicide Severity Risk Score: [place] ED from 01/21/2022 in [facility] ED from 01/19/2022 in [place] ED from 12/22/2021 in [place] Moderate Risk High Risk Moderate Risk       Last PHQ 2/9 Scores:    01/21/2022    1:04 AM 01/21/2022   12:57 AM  12/23/2021   11:13 AM  Depression screen PHQ 2/9  Decreased Interest  1 0  Down, Depressed, Hopeless  2 1  PHQ - 2 Score  3 1  Altered sleeping  0 0  Tired, decreased energy  1 0  Change in appetite  0 0  Feeling bad or failure about yourself   2 1  Trouble concentrating  1 0  Moving slowly or fidgety/restless  0 0  Suicidal thoughts  1 0  PHQ-9 Score  8 2  Difficult doing work/chores Very difficult  Not difficult at all    Scribe for Treatment Team: Kerri Perches, LCSW 01/21/2022 11:42 AM

## 2022-01-21 NOTE — ED Notes (Signed)
Patient resting quietly in bed with eyes closed. Respirations equal and unlabored, skin warm and dry, NAD. No change in assessment or acuity. Q 15 minute safety checks remain in place.   

## 2022-01-21 NOTE — ED Provider Notes (Signed)
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  The patient presented for care requesting help with depression and use of crack cocaine.  .  On interview and assessment this morning, the patient reports that he has been working in a [employer], [employer] for several weeks, which is gainful employment for him.  He reports that he had been living in an [place] house but that he left recently.  He states that his roommate brought in a CBD pen and he feels that this caused him to relapse on crack.  The patient does not desire residential rehab, instead he prefers to go stay in a motel and engage in intensive outpatient programming or individual counseling.  The patient denies experiencing any withdrawal symptoms and he appears to be in no distress.  The patient denies experiencing any suicidal or homicidal thoughts.  He denies experiencing auditory or visual hallucinations.   Diagnosis:  Final diagnoses:  Substance induced mood disorder (HCC)    Total Time spent with patient: 15 minutes  Past Psychiatric History: as above Past Medical History:  Past Medical History:  Diagnosis Date   Asthma    Stroke [facility])     Past Surgical History:  Procedure Laterality Date   CARDIAC CATHETERIZATION     Family History: No family history on file. Family Psychiatric  History: per H and P Social History:  Social History   Substance and Sexual Activity  Alcohol Use Yes     Social History   Substance and Sexual Activity  Drug Use Yes   Types: Marijuana    Social History   Socioeconomic History   Marital status: Divorced    Spouse name: Not on file   Number of children: Not on file   Years of education:  Not on file   Highest education level: Not on file  Occupational History   Not on file  Tobacco Use   Smoking status: Every Day    Types: Cigarettes   Smokeless tobacco: Never  Substance and Sexual Activity   Alcohol use: Yes   Drug use: Yes    Types: Marijuana   Sexual activity: Not on file  Other Topics Concern   Not on file  Social History Narrative   Not on file   Social Determinants of Health   Financial Resource Strain: Not on file  Food Insecurity: Not on file  Transportation Needs: Not on file  Physical Activity: Not on file  Stress: Not on file  Social Connections: Not on file   SDOH:  SDOH Screenings   Depression (PHQ2-9): Medium Risk (01/21/2022)  Tobacco Use: High Risk (01/19/2022)   Additional Social History:                         Sleep: Fair  Appetite:  Fair  Current Medications:  Current Facility-Administered Medications  Medication Dose Route Frequency Provider Last Rate Last Admin   acetaminophen (TYLENOL) tablet 650 mg  650 mg Oral Q6H PRN Ajibola, Ene A, NP       alum & mag hydroxide-simeth (MAALOX/MYLANTA) 200-200-20 MG/5ML suspension 30 mL  30 mL Oral Q4H PRN Ajibola, Ene A, NP       hydrOXYzine (  ATARAX) tablet 25 mg  25 mg Oral TID PRN Ajibola, Ene A, NP       magnesium hydroxide (MILK OF MAGNESIA) suspension 30 mL  30 mL Oral Daily PRN Ajibola, Ene A, NP       traZODone (DESYREL) tablet 50 mg  50 mg Oral QHS PRN Ajibola, Ene A, NP       venlafaxine XR (EFFEXOR-XR) 24 hr capsule 75 mg  75 mg Oral Daily Ajibola, Ene A, NP   75 mg at 01/21/22 0911   Current Outpatient Medications  Medication Sig Dispense Refill   acetaminophen (TYLENOL) 500 MG tablet Take 1,000 mg by mouth daily as needed for mild pain or headache.     albuterol (VENTOLIN HFA) 108 (90 Base) MCG/ACT inhaler Inhale 2 puffs into the lungs every 4 (four) hours as needed for wheezing or shortness of breath.     sildenafil (VIAGRA) 100 MG tablet Take 50-100 mg by mouth  daily as needed for erectile dysfunction.      Labs  Lab Results:  Admission on 01/21/2022  Component Date Value Ref Range Status   POC Amphetamine UR 01/21/2022 None Detected  NONE DETECTED (Cut Off Level 1000 ng/mL) Final   POC Secobarbital (BAR) 01/21/2022 None Detected  NONE DETECTED (Cut Off Level 300 ng/mL) Final   POC Buprenorphine (BUP) 01/21/2022 None Detected  NONE DETECTED (Cut Off Level 10 ng/mL) Final   POC Oxazepam (BZO) 01/21/2022 None Detected  NONE DETECTED (Cut Off Level 300 ng/mL) Final   POC Cocaine UR 01/21/2022 Positive (A)  NONE DETECTED (Cut Off Level 300 ng/mL) Final   POC Methamphetamine UR 01/21/2022 None Detected  NONE DETECTED (Cut Off Level 1000 ng/mL) Final   POC Morphine 01/21/2022 None Detected  NONE DETECTED (Cut Off Level 300 ng/mL) Final   POC Methadone UR 01/21/2022 None Detected  NONE DETECTED (Cut Off Level 300 ng/mL) Final   POC Oxycodone UR 01/21/2022 None Detected  NONE DETECTED (Cut Off Level 100 ng/mL) Final   POC Marijuana UR 01/21/2022 None Detected  NONE DETECTED (Cut Off Level 50 ng/mL) Final  Admission on 01/19/2022, Discharged on 01/21/2022  Component Date Value Ref Range Status   Sodium 01/19/2022 140  135 - 145 mmol/L Final   Potassium 01/19/2022 3.3 (L)  3.5 - 5.1 mmol/L Final   Chloride 01/19/2022 106  98 - 111 mmol/L Final   CO2 01/19/2022 24  22 - 32 mmol/L Final   Glucose, Bld 01/19/2022 160 (H)  70 - 99 mg/dL Final   Glucose reference range applies only to samples taken after fasting for at least 8 hours.   BUN 01/19/2022 17  6 - 20 mg/dL Final   Creatinine, Ser 01/19/2022 0.98  0.61 - 1.24 mg/dL Final   Calcium 78/29/562110/10/2021 9.3  8.9 - 10.3 mg/dL Final   Total Protein 30/86/578410/10/2021 7.9  6.5 - 8.1 g/dL Final   Albumin 69/62/952810/10/2021 4.3  3.5 - 5.0 g/dL Final   AST 41/32/440110/10/2021 26  15 - 41 U/L Final   ALT 01/19/2022 19  0 - 44 U/L Final   Alkaline Phosphatase 01/19/2022 85  38 - 126 U/L Final   Total Bilirubin 01/19/2022 0.8  0.3 - 1.2  mg/dL Final   GFR, Estimated 01/19/2022 >60  >60 mL/min Final   Comment: (NOTE) Calculated using the CKD-EPI Creatinine Equation (2021)    Anion gap 01/19/2022 10  5 - 15 Final   Performed at [facility] [place] [facility], [address] [place], [place] [id number]  Alcohol, Ethyl (B) 01/19/2022 <10  <10 mg/dL Final   Comment: (NOTE) Lowest detectable limit for serum alcohol is 10 mg/dL.  For medical purposes only. Performed at [facility], [address], [place], [place] [address]    Salicylate Lvl 01/19/2022 <7.0 (L)  7.0 - 30.0 mg/dL Final   Performed at [facility], [address], [place], [place] [address]   Acetaminophen (Tylenol), Serum 01/19/2022 <10 (L)  10 - 30 ug/mL Final   Comment: (NOTE) Therapeutic concentrations vary significantly. A range of 10-30 ug/mL  may be an effective concentration for many patients. However, some  are best treated at concentrations outside of this range. Acetaminophen concentrations >150 ug/mL at 4 hours after ingestion  and >50 ug/mL at 12 hours after ingestion are often associated with  toxic reactions.  Performed at [facility], [address] [place], [place] [address]    WBC 01/19/2022 7.4  4.0 - 10.5 K/uL Final   RBC 01/19/2022 5.81  4.22 - 5.81 MIL/uL Final   Hemoglobin 01/19/2022 13.9  13.0 - 17.0 g/dL Final   HCT 91/47/8295 42.4  39.0 - 52.0 % Final   MCV 01/19/2022 73.0 (L)  80.0 - 100.0 fL Final   MCH 01/19/2022 23.9 (L)  26.0 - 34.0 pg Final   MCHC 01/19/2022 32.8  30.0 - 36.0 g/dL Final   RDW 62/13/0865 16.0 (H)  11.5 - 15.5 % Final   Platelets 01/19/2022 311  150 - 400 K/uL Final   nRBC 01/19/2022 0.0  0.0 - 0.2 % Final   Performed at [facility], [address], [place], [place] [address]   SARS Coronavirus 2 by RT PCR 01/20/2022 NEGATIVE  NEGATIVE Final   Comment: (NOTE) SARS-CoV-2 target nucleic acids are NOT DETECTED.  The SARS-CoV-2 RNA is generally detectable in upper respiratory specimens  during the acute phase of infection. The lowest concentration of SARS-CoV-2 viral copies this assay can detect is 138 copies/mL. A negative result does not preclude SARS-Cov-2 infection and should not be used as the sole basis for treatment or other patient management decisions. A negative result may occur with  improper specimen collection/handling, submission of specimen other than nasopharyngeal swab, presence of viral mutation(s) within the areas targeted by this assay, and inadequate number of viral copies(<138 copies/mL). A negative result must be combined with clinical observations, patient history, and epidemiological information. The expected result is Negative.  Fact Sheet for Patients:  BloggerCourse.com  Fact Sheet for Healthcare Providers:  SeriousBroker.it  This test is no                          t yet approved or cleared by the [place] FDA and  has been authorized for detection and/or diagnosis of SARS-CoV-2 by FDA under an Emergency Use Authorization (EUA). This EUA will remain  in effect (meaning this test can be used) for the duration of the COVID-19 declaration under Section 564(b)(1) of the Act, 21 U.S.C.section 360bbb-3(b)(1), unless the authorization is terminated  or revoked sooner.       Influenza A by PCR 01/20/2022 NEGATIVE  NEGATIVE Final   Influenza B by PCR 01/20/2022 NEGATIVE  NEGATIVE Final   Comment: (NOTE) The Xpert Xpress SARS-CoV-2/FLU/RSV plus assay is intended as an aid in the diagnosis of influenza from Nasopharyngeal swab specimens and should not be used as a sole basis for treatment. Nasal washings and aspirates are unacceptable for Xpert  Xpress SARS-CoV-2/FLU/RSV testing.  Fact Sheet for Patients: BloggerCourse.com  Fact Sheet for Healthcare Providers: SeriousBroker.it  This test is not yet approved or cleared by the [place] FDA and has been authorized for detection and/or diagnosis of SARS-CoV-2 by FDA under an Emergency Use Authorization (EUA). This EUA will remain in effect (meaning this test can be used) for the duration of the COVID-19 declaration under Section 564(b)(1) of the Act, 21 U.S.C. section 360bbb-3(b)(1), unless the authorization is terminated or revoked.  Performed at [facility], [address] [place], [place] [address]   Admission on 12/22/2021, Discharged on 12/23/2021  Component Date Value Ref Range Status   SARS Coronavirus 2 by RT PCR 12/22/2021 NEGATIVE  NEGATIVE Final   Comment: (NOTE) SARS-CoV-2 target nucleic acids are NOT DETECTED.  The SARS-CoV-2 RNA is generally detectable in upper respiratory specimens during the acute phase of infection. The lowest concentration of SARS-CoV-2 viral copies this assay can detect is 138 copies/mL. A negative result does not preclude SARS-Cov-2 infection and should not be used as the sole basis for treatment or other patient management decisions. A negative result may occur with  improper specimen collection/handling, submission of specimen other than nasopharyngeal swab, presence of viral mutation(s) within the areas targeted by this assay, and inadequate number of viral copies(<138 copies/mL). A negative result must be combined with clinical observations, patient history, and epidemiological information. The expected result is Negative.  Fact Sheet for Patients:  BloggerCourse.com  Fact Sheet for Healthcare Providers:  SeriousBroker.it  This test is no                          t yet approved or cleared by the [place] FDA and  has been authorized for detection and/or diagnosis of SARS-CoV-2 by FDA under an Emergency Use Authorization (EUA). This EUA will remain  in effect (meaning this test can be used) for the duration of the COVID-19 declaration under Section  564(b)(1) of the Act, 21 U.S.C.section 360bbb-3(b)(1), unless the authorization is terminated  or revoked sooner.       Influenza A by PCR 12/22/2021 NEGATIVE  NEGATIVE Final   Influenza B by PCR 12/22/2021 NEGATIVE  NEGATIVE Final   Comment: (NOTE) The Xpert Xpress SARS-CoV-2/FLU/RSV plus assay is intended as an aid in the diagnosis of influenza from Nasopharyngeal swab specimens and should not be used as a sole basis for treatment. Nasal washings and aspirates are unacceptable for Xpert Xpress SARS-CoV-2/FLU/RSV testing.  Fact Sheet for Patients: BloggerCourse.com  Fact Sheet for Healthcare Providers: SeriousBroker.it  This test is not yet approved or cleared by the [place] FDA and has been authorized for detection and/or diagnosis of SARS-CoV-2 by FDA under an Emergency Use Authorization (EUA). This EUA will remain in effect (meaning this test can be used) for the duration of the COVID-19 declaration under Section 564(b)(1) of the Act, 21 U.S.C. section 360bbb-3(b)(1), unless the authorization is terminated or revoked.  Performed at [facility], [address], [place], [place] [address]    Sodium 12/22/2021 142  135 - 145 mmol/L Final   Potassium 12/22/2021 3.6  3.5 - 5.1 mmol/L Final   Chloride 12/22/2021 108  98 - 111 mmol/L Final   CO2 12/22/2021 23  22 - 32 mmol/L Final   Glucose, Bld 12/22/2021 93  70 - 99 mg/dL Final   Glucose reference range applies only to samples taken after fasting for at least 8  hours.   BUN 12/22/2021 18  6 - 20 mg/dL Final   Creatinine, Ser 12/22/2021 0.89  0.61 - 1.24 mg/dL Final   Calcium 16/01/9603 9.2  8.9 - 10.3 mg/dL Final   Total Protein 54/12/8117 7.5  6.5 - 8.1 g/dL Final   Albumin 14/78/2956 4.2  3.5 - 5.0 g/dL Final   AST 21/30/8657 24  15 - 41 U/L Final   ALT 12/22/2021 18  0 - 44 U/L Final   Alkaline Phosphatase 12/22/2021 80  38 - 126 U/L Final   Total Bilirubin  12/22/2021 1.0  0.3 - 1.2 mg/dL Final   GFR, Estimated 12/22/2021 >60  >60 mL/min Final   Comment: (NOTE) Calculated using the CKD-EPI Creatinine Equation (2021)    Anion gap 12/22/2021 11  5 - 15 Final   Performed at [facility], [address] [place], [place] [address]   Alcohol, Ethyl (B) 12/22/2021 <10  <10 mg/dL Final   Comment: (NOTE) Lowest detectable limit for serum alcohol is 10 mg/dL.  For medical purposes only. Performed at [facility], [address] [place], [place] [address]    Opiates 12/22/2021 NONE DETECTED  NONE DETECTED Final   Cocaine 12/22/2021 POSITIVE (A)  NONE DETECTED Final   Benzodiazepines 12/22/2021 NONE DETECTED  NONE DETECTED Final   Amphetamines 12/22/2021 NONE DETECTED  NONE DETECTED Final   Tetrahydrocannabinol 12/22/2021 NONE DETECTED  NONE DETECTED Final   Barbiturates 12/22/2021 NONE DETECTED  NONE DETECTED Final   Comment: (NOTE) DRUG SCREEN FOR MEDICAL PURPOSES ONLY.  IF CONFIRMATION IS NEEDED FOR ANY PURPOSE, NOTIFY LAB WITHIN 5 DAYS.  LOWEST DETECTABLE LIMITS FOR URINE DRUG SCREEN Drug Class                     Cutoff (ng/mL) Amphetamine and metabolites    1000 Barbiturate and metabolites    200 Benzodiazepine                 200 Tricyclics and metabolites     300 Opiates and metabolites        300 Cocaine and metabolites        300 THC                            50 Performed at [facility], [address] [place], [place] [address]    WBC 12/22/2021 6.7  4.0 - 10.5 K/uL Final   RBC 12/22/2021 5.61  4.22 - 5.81 MIL/uL Final   Hemoglobin 12/22/2021 13.2  13.0 - 17.0 g/dL Final   HCT 40/01/2724 41.5  39.0 - 52.0 % Final   MCV 12/22/2021 74.0 (L)  80.0 - 100.0 fL Final   MCH 12/22/2021 23.5 (L)  26.0 - 34.0 pg Final   MCHC 12/22/2021 31.8  30.0 - 36.0 g/dL Final   RDW 36/64/4034 16.8 (H)  11.5 - 15.5 % Final   Platelets 12/22/2021 269  150 - 400 K/uL Final   nRBC 12/22/2021 0.0  0.0 - 0.2 % Final    Neutrophils Relative % 12/22/2021 51  % Final   Neutro Abs 12/22/2021 3.4  1.7 - 7.7 K/uL Final   Lymphocytes Relative 12/22/2021 37  % Final   Lymphs Abs 12/22/2021 2.5  0.7 - 4.0 K/uL Final   Monocytes Relative 12/22/2021 10  % Final   Monocytes Absolute 12/22/2021 0.7  0.1 - 1.0 K/uL Final   Eosinophils Relative 12/22/2021 2  %  Final   Eosinophils Absolute 12/22/2021 0.1  0.0 - 0.5 K/uL Final   Basophils Relative 12/22/2021 0  % Final   Basophils Absolute 12/22/2021 0.0  0.0 - 0.1 K/uL Final   Immature Granulocytes 12/22/2021 0  % Final   Abs Immature Granulocytes 12/22/2021 0.02  0.00 - 0.07 K/uL Final   Performed at [facility], [address], [place], [place] [address]  Admission on 12/15/2021, Discharged on 12/16/2021  Component Date Value Ref Range Status   WBC 12/15/2021 7.0  4.0 - 10.5 K/uL Final   RBC 12/15/2021 5.60  4.22 - 5.81 MIL/uL Final   Hemoglobin 12/15/2021 13.4  13.0 - 17.0 g/dL Final   HCT 60/45/4098 40.7  39.0 - 52.0 % Final   MCV 12/15/2021 72.7 (L)  80.0 - 100.0 fL Final   MCH 12/15/2021 23.9 (L)  26.0 - 34.0 pg Final   MCHC 12/15/2021 32.9  30.0 - 36.0 g/dL Final   RDW 11/91/4782 16.8 (H)  11.5 - 15.5 % Final   Platelets 12/15/2021 252  150 - 400 K/uL Final   nRBC 12/15/2021 0.0  0.0 - 0.2 % Final   Neutrophils Relative % 12/15/2021 58  % Final   Neutro Abs 12/15/2021 4.0  1.7 - 7.7 K/uL Final   Lymphocytes Relative 12/15/2021 30  % Final   Lymphs Abs 12/15/2021 2.1  0.7 - 4.0 K/uL Final   Monocytes Relative 12/15/2021 11  % Final   Monocytes Absolute 12/15/2021 0.8  0.1 - 1.0 K/uL Final   Eosinophils Relative 12/15/2021 0  % Final   Eosinophils Absolute 12/15/2021 0.0  0.0 - 0.5 K/uL Final   Basophils Relative 12/15/2021 1  % Final   Basophils Absolute 12/15/2021 0.0  0.0 - 0.1 K/uL Final   Immature Granulocytes 12/15/2021 0  % Final   Abs Immature Granulocytes 12/15/2021 0.01  0.00 - 0.07 K/uL Final   Performed at [facility], [address] [place], [place] [address]   Sodium 12/15/2021 141  135 - 145 mmol/L Final   Potassium 12/15/2021 3.6  3.5 - 5.1 mmol/L Final   Chloride 12/15/2021 110  98 - 111 mmol/L Final   CO2 12/15/2021 24  22 - 32 mmol/L Final   Glucose, Bld 12/15/2021 102 (H)  70 - 99 mg/dL Final   Glucose reference range applies only to samples taken after fasting for at least 8 hours.   BUN 12/15/2021 21 (H)  6 - 20 mg/dL Final   Creatinine, Ser 12/15/2021 0.80  0.61 - 1.24 mg/dL Final   Calcium 30/86/5784 9.1  8.9 - 10.3 mg/dL Final   Total Protein 69/62/9528 7.7  6.5 - 8.1 g/dL Final   Albumin 41/32/4401 4.3  3.5 - 5.0 g/dL Final   AST 02/72/5366 26  15 - 41 U/L Final   ALT 12/15/2021 20  0 - 44 U/L Final   Alkaline Phosphatase 12/15/2021 90  38 - 126 U/L Final   Total Bilirubin 12/15/2021 0.7  0.3 - 1.2 mg/dL Final   GFR, Estimated 12/15/2021 >60  >60 mL/min Final   Comment: (NOTE) Calculated using the CKD-EPI Creatinine Equation (2021)    Anion gap 12/15/2021 7  5 - 15 Final   Performed at [facility], [address] [place], [place] [address]   Troponin I (High Sensitivity) 12/15/2021 9  <18 ng/L Final   Comment: (NOTE) Elevated high sensitivity troponin I (hsTnI) values and significant  changes across serial measurements may suggest  ACS but many other  chronic and acute conditions are known to elevate hsTnI results.  Refer to the "Links" section for chest pain algorithms and additional  guidance. Performed at [facility], [address] [place], [place] [address]    Troponin I (High Sensitivity) 12/15/2021 12  <18 ng/L Final   Comment: (NOTE) Elevated high sensitivity troponin I (hsTnI) values and significant  changes across serial measurements may suggest ACS but many other  chronic and acute conditions are known to elevate hsTnI results.  Refer to the "Links" section for chest pain algorithms and additional  guidance. Performed at [facility], [address]  [place], [place] [address]    Alcohol, Ethyl (B) 12/15/2021 <10  <10 mg/dL Final   Comment: (NOTE) Lowest detectable limit for serum alcohol is 10 mg/dL.  For medical purposes only. Performed at [facility], [address], [place], [place] [address]    Opiates 12/15/2021 NONE DETECTED  NONE DETECTED Final   Cocaine 12/15/2021 POSITIVE (A)  NONE DETECTED Final   Benzodiazepines 12/15/2021 NONE DETECTED  NONE DETECTED Final   Amphetamines 12/15/2021 NONE DETECTED  NONE DETECTED Final   Tetrahydrocannabinol 12/15/2021 NONE DETECTED  NONE DETECTED Final   Barbiturates 12/15/2021 NONE DETECTED  NONE DETECTED Final   Comment: (NOTE) DRUG SCREEN FOR MEDICAL PURPOSES ONLY.  IF CONFIRMATION IS NEEDED FOR ANY PURPOSE, NOTIFY LAB WITHIN 5 DAYS.  LOWEST DETECTABLE LIMITS FOR URINE DRUG SCREEN Drug Class                     Cutoff (ng/mL) Amphetamine and metabolites    1000 Barbiturate and metabolites    200 Benzodiazepine                 200 Tricyclics and metabolites     300 Opiates and metabolites        300 Cocaine and metabolites        300 THC                            50 Performed at [facility], [address] [place], [place] [address]    SARS Coronavirus 2 by RT PCR 12/15/2021 NEGATIVE  NEGATIVE Final   Comment: (NOTE) SARS-CoV-2 target nucleic acids are NOT DETECTED.  The SARS-CoV-2 RNA is generally detectable in upper respiratory specimens during the acute phase of infection. The lowest concentration of SARS-CoV-2 viral copies this assay can detect is 138 copies/mL. A negative result does not preclude SARS-Cov-2 infection and should not be used as the sole basis for treatment or other patient management decisions. A negative result may occur with  improper specimen collection/handling, submission of specimen other than nasopharyngeal swab, presence of viral mutation(s) within the areas targeted by this assay, and inadequate number of viral copies(<138  copies/mL). A negative result must be combined with clinical observations, patient history, and epidemiological information. The expected result is Negative.  Fact Sheet for Patients:  BloggerCourse.com  Fact Sheet for Healthcare Providers:  SeriousBroker.it  This test is no                          t yet approved or cleared by the [place] FDA and  has been authorized for detection and/or diagnosis of SARS-CoV-2 by FDA under an Emergency Use Authorization (EUA). This EUA will remain  in effect (meaning this test  can be used) for the duration of the COVID-19 declaration under Section 564(b)(1) of the Act, 21 U.S.C.section 360bbb-3(b)(1), unless the authorization is terminated  or revoked sooner.       Influenza A by PCR 12/15/2021 NEGATIVE  NEGATIVE Final   Influenza B by PCR 12/15/2021 NEGATIVE  NEGATIVE Final   Comment: (NOTE) The Xpert Xpress SARS-CoV-2/FLU/RSV plus assay is intended as an aid in the diagnosis of influenza from Nasopharyngeal swab specimens and should not be used as a sole basis for treatment. Nasal washings and aspirates are unacceptable for Xpert Xpress SARS-CoV-2/FLU/RSV testing.  Fact Sheet for Patients: BloggerCourse.com  Fact Sheet for Healthcare Providers: SeriousBroker.it  This test is not yet approved or cleared by the [place] FDA and has been authorized for detection and/or diagnosis of SARS-CoV-2 by FDA under an Emergency Use Authorization (EUA). This EUA will remain in effect (meaning this test can be used) for the duration of the COVID-19 declaration under Section 564(b)(1) of the Act, 21 U.S.C. section 360bbb-3(b)(1), unless the authorization is terminated or revoked.  Performed at [facility], [address], [place], [place] [address]     Blood Alcohol level:  Lab Results  Component Value Date   [facility] <10 01/19/2022    ETH <10 12/22/2021    Metabolic Disorder Labs: Lab Results  Component Value Date   HGBA1C 5.8 (H) 02/19/2021   MPG 119.76 02/19/2021   No results found for: "PROLACTIN" Lab Results  Component Value Date   CHOL 142 02/19/2021   TRIG 86 02/19/2021   HDL 38 (L) 02/19/2021   CHOLHDL 3.7 02/19/2021   VLDL 17 02/19/2021   LDLCALC 87 02/19/2021    Therapeutic Lab Levels: No results found for: "LITHIUM" No results found for: "VALPROATE" No results found for: "CBMZ"  Physical Findings   PHQ2-9    Flowsheet Row ED from 01/21/2022 in [facility] ED from 12/23/2021 in [facility]  PHQ-2 Total Score 3 1  PHQ-9 Total Score 8 2      Flowsheet Row ED from 01/21/2022 in [facility] ED from 01/19/2022 in [facility] EMERGENCY DEPARTMENT ED from 12/22/2021 in [facility] EMERGENCY DEPARTMENT  C-SSRS RISK CATEGORY Moderate Risk High Risk Moderate Risk        Musculoskeletal  Strength & Muscle Tone: within normal limits Gait & Station: normal Patient leans: N/A  Psychiatric Specialty Exam  Presentation  General Appearance:  Appropriate for Environment  Eye Contact: Good  Speech: Clear and Coherent  Speech Volume: Normal  Handedness: Right   Mood and Affect  Mood: Depressed  Affect: Congruent   Thought Process  Thought Processes: Coherent  Descriptions of Associations:Intact  Orientation:Full (Time, Place and Person)  Thought Content:WDL  Diagnosis of Schizophrenia or Schizoaffective disorder in past: No    Hallucinations:Hallucinations: None  Ideas of Reference:None  Suicidal Thoughts: denies Homicidal Thoughts:Homicidal Thoughts: No   Sensorium  Memory: Immediate Good; Remote Good; Recent Good  Judgment: Fair  Insight: Good   Executive Functions  Concentration: Good  Attention Span: Good  Recall: Fair  Fund of  Knowledge: Good  Language: Good   Psychomotor Activity  Psychomotor Activity: Psychomotor Activity: Normal   Assets  Assets: Communication Skills; Desire for Improvement; Housing; Transportation; Vocational/Educational   Sleep  Sleep: Sleep: Good Number of Hours of Sleep: 8   Nutritional Assessment (For OBS and [facility] admissions only) Has the patient had a weight loss or  gain of 10 pounds or more in the last 3 months?: No Has the patient had a decrease in food intake/or appetite?: No Does the patient have dental problems?: No Does the patient have eating habits or behaviors that may be indicators of an eating disorder including binging or inducing vomiting?: No Has the patient recently lost weight without trying?: 0 Has the patient been eating poorly because of a decreased appetite?: 0 Malnutrition Screening Tool Score: 0    Physical Exam  Physical Exam Constitutional:      Appearance: the patient is not toxic-appearing.  Pulmonary:     Effort: Pulmonary effort is normal.  Neurological:     General: No focal deficit present.     Mental Status: the patient is alert and oriented to person, place, and time.   Review of Systems  Respiratory:  Negative for shortness of breath.   Cardiovascular:  Negative for chest pain.  Gastrointestinal:  Negative for abdominal pain, constipation, diarrhea, nausea and vomiting.  Neurological:  Negative for headaches.   Blood pressure 131/88, pulse 72, temperature 98.5 F (36.9 C), temperature source Oral, resp. rate 18, SpO2 100 %. There is no height or weight on file to calculate BMI.  Treatment Plan Summary: Daily contact with patient to assess and evaluate symptoms and progress in treatment and Medication management   Patient started on Effexor 75 mg daily for mood symptoms.  Denies side effects.  Admission labs reviewed, unremarkable.  Disposition: LCSW to assist patient in obtaining outpatient resources.   Corky Sox,  MD 01/21/2022 12:17 PM

## 2022-01-21 NOTE — ED Notes (Signed)
Pt unable to give urine specimen at this time, pushing fluids at present.

## 2022-01-21 NOTE — ED Notes (Addendum)
Pt admitted to [facility] for SI with plan to walk in traffic and substance use. Pt A&O x4, calm and cooperative. Pt denies current SI/HI/AVH. Pt tolerated admission process and skin assessment well. Pt ambulated independently to unit. Oriented to unit/staff. Sandwich, chips, and juice given per pt request. No signs of distress noted. Monitoring for safety.

## 2022-01-21 NOTE — ED Notes (Signed)
Patient resting quietly in bed with eyes closed, Respirations equal and unlabored, skin warm and dry, NAD. No change in assessment or acuity. Q 15 minute safety checks remain in place.   

## 2022-01-21 NOTE — ED Notes (Signed)
RN gave report to [employer] and called [facility] to notify regarding pts arrival. Pt ambulatory upon discharge

## 2022-01-21 NOTE — ED Notes (Signed)
Patient A&Ox4. Patient denies SI/HI and AVH at this time. Patient denies any physical complaints when asked. No acute distress noted. Support and encouragement provided. Routine safety checks conducted according to facility protocol. Encouraged patient to notify staff if thoughts of harm toward self or others arise. Patient verbalize understanding and agreement. Will continue to monitor for safety. Q 15 min safety checks remain in place.

## 2022-01-21 NOTE — ED Notes (Signed)
Eating lunch 

## 2022-01-21 NOTE — ED Notes (Signed)
Patient in the dinning room watching TV. Respirations are even and unlabored. No acute distress noted. Will continue to monitor for safety.

## 2022-01-21 NOTE — Tx Team (Signed)
Treatment Team Meeting   Dr. Alvie Heidelberg and LCSW met with pt to assess his current mood and affect, physical state, and inquire about his needs and goals while in the [facility] and for when he is discharged.  Pt presents with worsening symptoms of depression and SI with a plan to walk into traffic.  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  He states that he is open to outpatient psychotherapy and medication management since residential treatment is not an option for him while he is still employed.  LCSW informed pt that research will be done to find a provider who conducts CD-IOP or PHP/IOP in the evenings so that it will not interfere with his work schedule.   Omelia Blackwater, MSW, [facility] [phone] phone [phone] work mobile

## 2022-01-22 DIAGNOSIS — F1994 Other psychoactive substance use, unspecified with psychoactive substance-induced mood disorder: Secondary | ICD-10-CM | POA: Diagnosis not present

## 2022-01-22 NOTE — ED Notes (Signed)
Patient in dayroom watching TV. Respirations equal and unlabored, skin warm and dry, NAD. No change in assessment or acuity. Q 15 minute safety checks remain in place.   

## 2022-01-22 NOTE — ED Provider Notes (Signed)
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  The patient presented for care requesting help with depression and use of crack cocaine.  On interview and assessment this morning, the patient denies experiencing any symptoms of withdrawal and does not appear to be in distress.  The patient reports appropriate sleep and appetite and denies experiencing any suicidal thoughts.  Box breathing is discussed with the patient and he reports interest in practicing this over the next day.  The patient is given a journaling assignment to describe the time in his life when he felt the most happy and the most fulfilled.  Plan to discuss with the patient tomorrow.  Patient is interested in shelter resources, LCSW to provide.  LCSW states the patient will not be able to get evening CD IOP but that she is looking into other possibilities for the patient given that he has to work in the daytime.  Can obtain individual counseling for the patient at the very least.  Patient will need a work note for days of work missed.  Diagnosis:  Final diagnoses:  Substance induced mood disorder (HCC)    Total Time spent with patient: 15 minutes  Past Psychiatric History: as above Past Medical History:  Past Medical History:  Diagnosis Date   Asthma    Stroke [facility])     Past Surgical History:  Procedure Laterality Date   CARDIAC CATHETERIZATION     Family History: No family history on file. Family Psychiatric  History: per H and P Social History:  Social History   Substance and Sexual Activity  Alcohol Use Yes     Social History   Substance and Sexual Activity  Drug Use Yes   Types: Marijuana    Social History    Socioeconomic History   Marital status: Divorced    Spouse name: Not on file   Number of children: Not on file   Years of education: Not on file   Highest education level: Not on file  Occupational History   Not on file  Tobacco Use   Smoking status: Every Day    Types: Cigarettes   Smokeless tobacco: Never  Substance and Sexual Activity   Alcohol use: Yes   Drug use: Yes    Types: Marijuana   Sexual activity: Not on file  Other Topics Concern   Not on file  Social History Narrative   Not on file   Social Determinants of Health   Financial Resource Strain: Not on file  Food Insecurity: Not on file  Transportation Needs: Not on file  Physical Activity: Not on file  Stress: Not on file  Social Connections: Not on file   SDOH:  SDOH Screenings   Depression (PHQ2-9): High Risk (01/21/2022)  Tobacco Use: High Risk (01/19/2022)   Additional Social History:                         Sleep: Fair  Appetite:  Fair  Current Medications:  Current Facility-Administered Medications  Medication Dose Route Frequency Provider Last Rate Last Admin   acetaminophen (TYLENOL) tablet 650 mg  650 mg Oral Q6H PRN Ajibola, Ene A, NP  alum & mag hydroxide-simeth (MAALOX/MYLANTA) 200-200-20 MG/5ML suspension 30 mL  30 mL Oral Q4H PRN Ajibola, Ene A, NP       hydrOXYzine (ATARAX) tablet 25 mg  25 mg Oral TID PRN Ajibola, Ene A, NP       magnesium hydroxide (MILK OF MAGNESIA) suspension 30 mL  30 mL Oral Daily PRN Ajibola, Ene A, NP       traZODone (DESYREL) tablet 50 mg  50 mg Oral QHS PRN Ajibola, Ene A, NP       venlafaxine XR (EFFEXOR-XR) 24 hr capsule 75 mg  75 mg Oral Daily Ajibola, Ene A, NP   75 mg at 01/22/22 0900   Current Outpatient Medications  Medication Sig Dispense Refill   acetaminophen (TYLENOL) 500 MG tablet Take 1,000 mg by mouth daily as needed for mild pain or headache.     albuterol (VENTOLIN HFA) 108 (90 Base) MCG/ACT inhaler Inhale 2 puffs into the  lungs every 4 (four) hours as needed for wheezing or shortness of breath.     sildenafil (VIAGRA) 100 MG tablet Take 50-100 mg by mouth daily as needed for erectile dysfunction.      Labs  Lab Results:  Admission on 01/21/2022  Component Date Value Ref Range Status   POC Amphetamine UR 01/21/2022 None Detected  NONE DETECTED (Cut Off Level 1000 ng/mL) Final   POC Secobarbital (BAR) 01/21/2022 None Detected  NONE DETECTED (Cut Off Level 300 ng/mL) Final   POC Buprenorphine (BUP) 01/21/2022 None Detected  NONE DETECTED (Cut Off Level 10 ng/mL) Final   POC Oxazepam (BZO) 01/21/2022 None Detected  NONE DETECTED (Cut Off Level 300 ng/mL) Final   POC Cocaine UR 01/21/2022 Positive (A)  NONE DETECTED (Cut Off Level 300 ng/mL) Final   POC Methamphetamine UR 01/21/2022 None Detected  NONE DETECTED (Cut Off Level 1000 ng/mL) Final   POC Morphine 01/21/2022 None Detected  NONE DETECTED (Cut Off Level 300 ng/mL) Final   POC Methadone UR 01/21/2022 None Detected  NONE DETECTED (Cut Off Level 300 ng/mL) Final   POC Oxycodone UR 01/21/2022 None Detected  NONE DETECTED (Cut Off Level 100 ng/mL) Final   POC Marijuana UR 01/21/2022 None Detected  NONE DETECTED (Cut Off Level 50 ng/mL) Final  Admission on 01/19/2022, Discharged on 01/21/2022  Component Date Value Ref Range Status   Sodium 01/19/2022 140  135 - 145 mmol/L Final   Potassium 01/19/2022 3.3 (L)  3.5 - 5.1 mmol/L Final   Chloride 01/19/2022 106  98 - 111 mmol/L Final   CO2 01/19/2022 24  22 - 32 mmol/L Final   Glucose, Bld 01/19/2022 160 (H)  70 - 99 mg/dL Final   Glucose reference range applies only to samples taken after fasting for at least 8 hours.   BUN 01/19/2022 17  6 - 20 mg/dL Final   Creatinine, Ser 01/19/2022 0.98  0.61 - 1.24 mg/dL Final   Calcium 16/10/960410/10/2021 9.3  8.9 - 10.3 mg/dL Final   Total Protein 54/09/811910/10/2021 7.9  6.5 - 8.1 g/dL Final   Albumin 14/78/295610/10/2021 4.3  3.5 - 5.0 g/dL Final   AST 21/30/865710/10/2021 26  15 - 41 U/L Final   ALT  01/19/2022 19  0 - 44 U/L Final   Alkaline Phosphatase 01/19/2022 85  38 - 126 U/L Final   Total Bilirubin 01/19/2022 0.8  0.3 - 1.2 mg/dL Final   GFR, Estimated 01/19/2022 >60  >60 mL/min Final   Comment: (NOTE) Calculated using the CKD-EPI Creatinine Equation (2021)  Anion gap 01/19/2022 10  5 - 15 Final   Performed at [facility] Lab, [address] [place], [place] [address]   Alcohol, Ethyl (B) 01/19/2022 <10  <10 mg/dL Final   Comment: (NOTE) Lowest detectable limit for serum alcohol is 10 mg/dL.  For medical purposes only. Performed at [facility], [address], [place], [place] [address]    Salicylate Lvl 01/19/2022 <7.0 (L)  7.0 - 30.0 mg/dL Final   Performed at [facility], [address], [place], [place] [address]   Acetaminophen (Tylenol), Serum 01/19/2022 <10 (L)  10 - 30 ug/mL Final   Comment: (NOTE) Therapeutic concentrations vary significantly. A range of 10-30 ug/mL  may be an effective concentration for many patients. However, some  are best treated at concentrations outside of this range. Acetaminophen concentrations >150 ug/mL at 4 hours after ingestion  and >50 ug/mL at 12 hours after ingestion are often associated with  toxic reactions.  Performed at [facility], [address], [place], [place] [address]    WBC 01/19/2022 7.4  4.0 - 10.5 K/uL Final   RBC 01/19/2022 5.81  4.22 - 5.81 MIL/uL Final   Hemoglobin 01/19/2022 13.9  13.0 - 17.0 g/dL Final   HCT 29/56/2130 42.4  39.0 - 52.0 % Final   MCV 01/19/2022 73.0 (L)  80.0 - 100.0 fL Final   MCH 01/19/2022 23.9 (L)  26.0 - 34.0 pg Final   MCHC 01/19/2022 32.8  30.0 - 36.0 g/dL Final   RDW 86/57/8469 16.0 (H)  11.5 - 15.5 % Final   Platelets 01/19/2022 311  150 - 400 K/uL Final   nRBC 01/19/2022 0.0  0.0 - 0.2 % Final   Performed at [facility] Lab, [address], [place], [place] [address]   SARS Coronavirus 2 by RT PCR 01/20/2022 NEGATIVE  NEGATIVE Final   Comment:  (NOTE) SARS-CoV-2 target nucleic acids are NOT DETECTED.  The SARS-CoV-2 RNA is generally detectable in upper respiratory specimens during the acute phase of infection. The lowest concentration of SARS-CoV-2 viral copies this assay can detect is 138 copies/mL. A negative result does not preclude SARS-Cov-2 infection and should not be used as the sole basis for treatment or other patient management decisions. A negative result may occur with  improper specimen collection/handling, submission of specimen other than nasopharyngeal swab, presence of viral mutation(s) within the areas targeted by this assay, and inadequate number of viral copies(<138 copies/mL). A negative result must be combined with clinical observations, patient history, and epidemiological information. The expected result is Negative.  Fact Sheet for Patients:  BloggerCourse.com  Fact Sheet for Healthcare Providers:  SeriousBroker.it  This test is no                          t yet approved or cleared by the [place] FDA and  has been authorized for detection and/or diagnosis of SARS-CoV-2 by FDA under an Emergency Use Authorization (EUA). This EUA will remain  in effect (meaning this test can be used) for the duration of the COVID-19 declaration under Section 564(b)(1) of the Act, 21 U.S.C.section 360bbb-3(b)(1), unless the authorization is terminated  or revoked sooner.       Influenza A by PCR 01/20/2022 NEGATIVE  NEGATIVE Final   Influenza B by PCR 01/20/2022 NEGATIVE  NEGATIVE Final   Comment: (NOTE) The Xpert Xpress SARS-CoV-2/FLU/RSV plus assay is intended as an aid in the  diagnosis of influenza from Nasopharyngeal swab specimens and should not be used as a sole basis for treatment. Nasal washings and aspirates are unacceptable for Xpert Xpress SARS-CoV-2/FLU/RSV testing.  Fact Sheet for Patients: BloggerCourse.com  Fact  Sheet for Healthcare Providers: SeriousBroker.it  This test is not yet approved or cleared by the [place] FDA and has been authorized for detection and/or diagnosis of SARS-CoV-2 by FDA under an Emergency Use Authorization (EUA). This EUA will remain in effect (meaning this test can be used) for the duration of the COVID-19 declaration under Section 564(b)(1) of the Act, 21 U.S.C. section 360bbb-3(b)(1), unless the authorization is terminated or revoked.  Performed at [facility], [address], [place], [place] [address]   Admission on 12/22/2021, Discharged on 12/23/2021  Component Date Value Ref Range Status   SARS Coronavirus 2 by RT PCR 12/22/2021 NEGATIVE  NEGATIVE Final   Comment: (NOTE) SARS-CoV-2 target nucleic acids are NOT DETECTED.  The SARS-CoV-2 RNA is generally detectable in upper respiratory specimens during the acute phase of infection. The lowest concentration of SARS-CoV-2 viral copies this assay can detect is 138 copies/mL. A negative result does not preclude SARS-Cov-2 infection and should not be used as the sole basis for treatment or other patient management decisions. A negative result may occur with  improper specimen collection/handling, submission of specimen other than nasopharyngeal swab, presence of viral mutation(s) within the areas targeted by this assay, and inadequate number of viral copies(<138 copies/mL). A negative result must be combined with clinical observations, patient history, and epidemiological information. The expected result is Negative.  Fact Sheet for Patients:  BloggerCourse.com  Fact Sheet for Healthcare Providers:  SeriousBroker.it  This test is no                          t yet approved or cleared by the [place] FDA and  has been authorized for detection and/or diagnosis of SARS-CoV-2 by FDA under an Emergency Use Authorization  (EUA). This EUA will remain  in effect (meaning this test can be used) for the duration of the COVID-19 declaration under Section 564(b)(1) of the Act, 21 U.S.C.section 360bbb-3(b)(1), unless the authorization is terminated  or revoked sooner.       Influenza A by PCR 12/22/2021 NEGATIVE  NEGATIVE Final   Influenza B by PCR 12/22/2021 NEGATIVE  NEGATIVE Final   Comment: (NOTE) The Xpert Xpress SARS-CoV-2/FLU/RSV plus assay is intended as an aid in the diagnosis of influenza from Nasopharyngeal swab specimens and should not be used as a sole basis for treatment. Nasal washings and aspirates are unacceptable for Xpert Xpress SARS-CoV-2/FLU/RSV testing.  Fact Sheet for Patients: BloggerCourse.com  Fact Sheet for Healthcare Providers: SeriousBroker.it  This test is not yet approved or cleared by the [place] FDA and has been authorized for detection and/or diagnosis of SARS-CoV-2 by FDA under an Emergency Use Authorization (EUA). This EUA will remain in effect (meaning this test can be used) for the duration of the COVID-19 declaration under Section 564(b)(1) of the Act, 21 U.S.C. section 360bbb-3(b)(1), unless the authorization is terminated or revoked.  Performed at [facility], [address], [place], [place] [address]    Sodium 12/22/2021 142  135 - 145 mmol/L Final   Potassium 12/22/2021 3.6  3.5 - 5.1 mmol/L Final   Chloride 12/22/2021 108  98 - 111 mmol/L Final   CO2 12/22/2021 23  22 - 32 mmol/L Final  Glucose, Bld 12/22/2021 93  70 - 99 mg/dL Final   Glucose reference range applies only to samples taken after fasting for at least 8 hours.   BUN 12/22/2021 18  6 - 20 mg/dL Final   Creatinine, Ser 12/22/2021 0.89  0.61 - 1.24 mg/dL Final   Calcium 16/01/9603 9.2  8.9 - 10.3 mg/dL Final   Total Protein 54/12/8117 7.5  6.5 - 8.1 g/dL Final   Albumin 14/78/2956 4.2  3.5 - 5.0 g/dL Final   AST 21/30/8657 24  15  - 41 U/L Final   ALT 12/22/2021 18  0 - 44 U/L Final   Alkaline Phosphatase 12/22/2021 80  38 - 126 U/L Final   Total Bilirubin 12/22/2021 1.0  0.3 - 1.2 mg/dL Final   GFR, Estimated 12/22/2021 >60  >60 mL/min Final   Comment: (NOTE) Calculated using the CKD-EPI Creatinine Equation (2021)    Anion gap 12/22/2021 11  5 - 15 Final   Performed at [facility], [address], [place], [place] [address]   Alcohol, Ethyl (B) 12/22/2021 <10  <10 mg/dL Final   Comment: (NOTE) Lowest detectable limit for serum alcohol is 10 mg/dL.  For medical purposes only. Performed at [facility], [address] [place], [place] [address]    Opiates 12/22/2021 NONE DETECTED  NONE DETECTED Final   Cocaine 12/22/2021 POSITIVE (A)  NONE DETECTED Final   Benzodiazepines 12/22/2021 NONE DETECTED  NONE DETECTED Final   Amphetamines 12/22/2021 NONE DETECTED  NONE DETECTED Final   Tetrahydrocannabinol 12/22/2021 NONE DETECTED  NONE DETECTED Final   Barbiturates 12/22/2021 NONE DETECTED  NONE DETECTED Final   Comment: (NOTE) DRUG SCREEN FOR MEDICAL PURPOSES ONLY.  IF CONFIRMATION IS NEEDED FOR ANY PURPOSE, NOTIFY LAB WITHIN 5 DAYS.  LOWEST DETECTABLE LIMITS FOR URINE DRUG SCREEN Drug Class                     Cutoff (ng/mL) Amphetamine and metabolites    1000 Barbiturate and metabolites    200 Benzodiazepine                 200 Tricyclics and metabolites     300 Opiates and metabolites        300 Cocaine and metabolites        300 THC                            50 Performed at [facility], [address] [place], [place] [address]    WBC 12/22/2021 6.7  4.0 - 10.5 K/uL Final   RBC 12/22/2021 5.61  4.22 - 5.81 MIL/uL Final   Hemoglobin 12/22/2021 13.2  13.0 - 17.0 g/dL Final   HCT 40/01/2724 41.5  39.0 - 52.0 % Final   MCV 12/22/2021 74.0 (L)  80.0 - 100.0 fL Final   MCH 12/22/2021 23.5 (L)  26.0 - 34.0 pg Final   MCHC 12/22/2021 31.8  30.0 - 36.0 g/dL Final   RDW 36/64/4034  16.8 (H)  11.5 - 15.5 % Final   Platelets 12/22/2021 269  150 - 400 K/uL Final   nRBC 12/22/2021 0.0  0.0 - 0.2 % Final   Neutrophils Relative % 12/22/2021 51  % Final   Neutro Abs 12/22/2021 3.4  1.7 - 7.7 K/uL Final   Lymphocytes Relative 12/22/2021 37  % Final   Lymphs Abs 12/22/2021 2.5  0.7 - 4.0 K/uL Final  Monocytes Relative 12/22/2021 10  % Final   Monocytes Absolute 12/22/2021 0.7  0.1 - 1.0 K/uL Final   Eosinophils Relative 12/22/2021 2  % Final   Eosinophils Absolute 12/22/2021 0.1  0.0 - 0.5 K/uL Final   Basophils Relative 12/22/2021 0  % Final   Basophils Absolute 12/22/2021 0.0  0.0 - 0.1 K/uL Final   Immature Granulocytes 12/22/2021 0  % Final   Abs Immature Granulocytes 12/22/2021 0.02  0.00 - 0.07 K/uL Final   Performed at [facility], [address], [place], [place] [address]  Admission on 12/15/2021, Discharged on 12/16/2021  Component Date Value Ref Range Status   WBC 12/15/2021 7.0  4.0 - 10.5 K/uL Final   RBC 12/15/2021 5.60  4.22 - 5.81 MIL/uL Final   Hemoglobin 12/15/2021 13.4  13.0 - 17.0 g/dL Final   HCT 65/99/3570 40.7  39.0 - 52.0 % Final   MCV 12/15/2021 72.7 (L)  80.0 - 100.0 fL Final   MCH 12/15/2021 23.9 (L)  26.0 - 34.0 pg Final   MCHC 12/15/2021 32.9  30.0 - 36.0 g/dL Final   RDW 17/79/3903 16.8 (H)  11.5 - 15.5 % Final   Platelets 12/15/2021 252  150 - 400 K/uL Final   nRBC 12/15/2021 0.0  0.0 - 0.2 % Final   Neutrophils Relative % 12/15/2021 58  % Final   Neutro Abs 12/15/2021 4.0  1.7 - 7.7 K/uL Final   Lymphocytes Relative 12/15/2021 30  % Final   Lymphs Abs 12/15/2021 2.1  0.7 - 4.0 K/uL Final   Monocytes Relative 12/15/2021 11  % Final   Monocytes Absolute 12/15/2021 0.8  0.1 - 1.0 K/uL Final   Eosinophils Relative 12/15/2021 0  % Final   Eosinophils Absolute 12/15/2021 0.0  0.0 - 0.5 K/uL Final   Basophils Relative 12/15/2021 1  % Final   Basophils Absolute 12/15/2021 0.0  0.0 - 0.1 K/uL Final   Immature Granulocytes 12/15/2021 0   % Final   Abs Immature Granulocytes 12/15/2021 0.01  0.00 - 0.07 K/uL Final   Performed at [facility], [address] [place], [place] [address]   Sodium 12/15/2021 141  135 - 145 mmol/L Final   Potassium 12/15/2021 3.6  3.5 - 5.1 mmol/L Final   Chloride 12/15/2021 110  98 - 111 mmol/L Final   CO2 12/15/2021 24  22 - 32 mmol/L Final   Glucose, Bld 12/15/2021 102 (H)  70 - 99 mg/dL Final   Glucose reference range applies only to samples taken after fasting for at least 8 hours.   BUN 12/15/2021 21 (H)  6 - 20 mg/dL Final   Creatinine, Ser 12/15/2021 0.80  0.61 - 1.24 mg/dL Final   Calcium 30/10/6224 9.1  8.9 - 10.3 mg/dL Final   Total Protein 33/35/4562 7.7  6.5 - 8.1 g/dL Final   Albumin 56/38/9373 4.3  3.5 - 5.0 g/dL Final   AST 42/87/6811 26  15 - 41 U/L Final   ALT 12/15/2021 20  0 - 44 U/L Final   Alkaline Phosphatase 12/15/2021 90  38 - 126 U/L Final   Total Bilirubin 12/15/2021 0.7  0.3 - 1.2 mg/dL Final   GFR, Estimated 12/15/2021 >60  >60 mL/min Final   Comment: (NOTE) Calculated using the CKD-EPI Creatinine Equation (2021)    Anion gap 12/15/2021 7  5 - 15 Final   Performed at [facility], [address] [place], [place] [address]   Troponin I (High  Sensitivity) 12/15/2021 9  <18 ng/L Final   Comment: (NOTE) Elevated high sensitivity troponin I (hsTnI) values and significant  changes across serial measurements may suggest ACS but many other  chronic and acute conditions are known to elevate hsTnI results.  Refer to the "Links" section for chest pain algorithms and additional  guidance. Performed at [facility], [address] [place], [place] [address]    Troponin I (High Sensitivity) 12/15/2021 12  <18 ng/L Final   Comment: (NOTE) Elevated high sensitivity troponin I (hsTnI) values and significant  changes across serial measurements may suggest ACS but many other  chronic and acute conditions are known to elevate hsTnI results.  Refer to the  "Links" section for chest pain algorithms and additional  guidance. Performed at [facility], [address] [place], [place] [address]    Alcohol, Ethyl (B) 12/15/2021 <10  <10 mg/dL Final   Comment: (NOTE) Lowest detectable limit for serum alcohol is 10 mg/dL.  For medical purposes only. Performed at [facility], [address], [place], [place] [address]    Opiates 12/15/2021 NONE DETECTED  NONE DETECTED Final   Cocaine 12/15/2021 POSITIVE (A)  NONE DETECTED Final   Benzodiazepines 12/15/2021 NONE DETECTED  NONE DETECTED Final   Amphetamines 12/15/2021 NONE DETECTED  NONE DETECTED Final   Tetrahydrocannabinol 12/15/2021 NONE DETECTED  NONE DETECTED Final   Barbiturates 12/15/2021 NONE DETECTED  NONE DETECTED Final   Comment: (NOTE) DRUG SCREEN FOR MEDICAL PURPOSES ONLY.  IF CONFIRMATION IS NEEDED FOR ANY PURPOSE, NOTIFY LAB WITHIN 5 DAYS.  LOWEST DETECTABLE LIMITS FOR URINE DRUG SCREEN Drug Class                     Cutoff (ng/mL) Amphetamine and metabolites    1000 Barbiturate and metabolites    200 Benzodiazepine                 200 Tricyclics and metabolites     300 Opiates and metabolites        300 Cocaine and metabolites        300 THC                            50 Performed at [facility] Lab, [address], [place], [place] [address]    SARS Coronavirus 2 by RT PCR 12/15/2021 NEGATIVE  NEGATIVE Final   Comment: (NOTE) SARS-CoV-2 target nucleic acids are NOT DETECTED.  The SARS-CoV-2 RNA is generally detectable in upper respiratory specimens during the acute phase of infection. The lowest concentration of SARS-CoV-2 viral copies this assay can detect is 138 copies/mL. A negative result does not preclude SARS-Cov-2 infection and should not be used as the sole basis for treatment or other patient management decisions. A negative result may occur with  improper specimen collection/handling, submission of specimen other than nasopharyngeal  swab, presence of viral mutation(s) within the areas targeted by this assay, and inadequate number of viral copies(<138 copies/mL). A negative result must be combined with clinical observations, patient history, and epidemiological information. The expected result is Negative.  Fact Sheet for Patients:  BloggerCourse.com  Fact Sheet for Healthcare Providers:  SeriousBroker.it  This test is no                          t yet approved or cleared by the [place] and  has been authorized for detection and/or diagnosis of SARS-CoV-2 by FDA under an Emergency Use Authorization (EUA). This EUA will remain  in effect (meaning this test can be used) for the duration of the COVID-19 declaration under Section 564(b)(1) of the Act, 21 U.S.C.section 360bbb-3(b)(1), unless the authorization is terminated  or revoked sooner.       Influenza A by PCR 12/15/2021 NEGATIVE  NEGATIVE Final   Influenza B by PCR 12/15/2021 NEGATIVE  NEGATIVE Final   Comment: (NOTE) The Xpert Xpress SARS-CoV-2/FLU/RSV plus assay is intended as an aid in the diagnosis of influenza from Nasopharyngeal swab specimens and should not be used as a sole basis for treatment. Nasal washings and aspirates are unacceptable for Xpert Xpress SARS-CoV-2/FLU/RSV testing.  Fact Sheet for Patients: BloggerCourse.com  Fact Sheet for Healthcare Providers: SeriousBroker.it  This test is not yet approved or cleared by the [place] FDA and has been authorized for detection and/or diagnosis of SARS-CoV-2 by FDA under an Emergency Use Authorization (EUA). This EUA will remain in effect (meaning this test can be used) for the duration of the COVID-19 declaration under Section 564(b)(1) of the Act, 21 U.S.C. section 360bbb-3(b)(1), unless the authorization is terminated or revoked.  Performed at [facility] Lab, [address], [place], [place] [address]     Blood Alcohol level:  Lab Results  Component Value Date   [facility] <10 01/19/2022   ETH <10 12/22/2021    Metabolic Disorder Labs: Lab Results  Component Value Date   HGBA1C 5.8 (H) 02/19/2021   MPG 119.76 02/19/2021   No results found for: "PROLACTIN" Lab Results  Component Value Date   CHOL 142 02/19/2021   TRIG 86 02/19/2021   HDL 38 (L) 02/19/2021   CHOLHDL 3.7 02/19/2021   VLDL 17 02/19/2021   LDLCALC 87 02/19/2021    Therapeutic Lab Levels: No results found for: "LITHIUM" No results found for: "VALPROATE" No results found for: "CBMZ"  Physical Findings   PHQ2-9    Flowsheet Row ED from 01/21/2022 in [facility] ED from 12/23/2021 in [facility]  PHQ-2 Total Score 6 1  PHQ-9 Total Score 18 2      Flowsheet Row ED from 01/21/2022 in [facility] ED from 01/19/2022 in [facility] EMERGENCY DEPARTMENT ED from 12/22/2021 in [facility] EMERGENCY DEPARTMENT  C-SSRS RISK CATEGORY Moderate Risk High Risk Moderate Risk        Musculoskeletal  Strength & Muscle Tone: within normal limits Gait & Station: normal Patient leans: N/A  Psychiatric Specialty Exam  Presentation  General Appearance:  Appropriate for Environment  Eye Contact: Good  Speech: Clear and Coherent  Speech Volume: Normal  Handedness: Right   Mood and Affect  Mood: Euthymic   Affect: Congruent   Thought Process  Thought Processes: Coherent  Descriptions of Associations:Intact  Orientation:Full (Time, Place and Person)  Thought Content:WDL  Diagnosis of Schizophrenia or Schizoaffective disorder in past: No    Hallucinations:Hallucinations: None  Ideas of Reference:None  Suicidal Thoughts: denies Homicidal Thoughts:Homicidal Thoughts: No   Sensorium  Memory: Immediate Good; Remote Good; Recent  Good  Judgment: Fair  Insight: Good   Executive Functions  Concentration: Good  Attention Span: Good  Recall: Fair  Fund of Knowledge: Good  Language: Good   Psychomotor Activity  Psychomotor Activity: Psychomotor Activity: Normal   Assets  Assets: Communication Skills; Desire for Improvement; Housing; Transportation; Vocational/Educational   Sleep  Sleep: Sleep: Good Number of Hours of Sleep: 8   Nutritional Assessment (For OBS and FBC admissions only) Has the patient had a weight loss or gain of 10 pounds or more in the last 3 months?: No Has the patient had a decrease in food intake/or appetite?: No Does the patient have dental problems?: No Does the patient have eating habits or behaviors that may be indicators of an eating disorder including binging or inducing vomiting?: No Has the patient recently lost weight without trying?: 0 Has the patient been eating poorly because of a decreased appetite?: 0 Malnutrition Screening Tool Score: 0    Physical Exam  Physical Exam Constitutional:      Appearance: the patient is not toxic-appearing.  Pulmonary:     Effort: Pulmonary effort is normal.  Neurological:     General: No focal deficit present.     Mental Status: the patient is alert and oriented to person, place, and time.   Review of Systems  Respiratory:  Negative for shortness of breath.   Cardiovascular:  Negative for chest pain.  Gastrointestinal:  Negative for abdominal pain, constipation, diarrhea, nausea and vomiting.  Neurological:  Negative for headaches.   Blood pressure 124/85, pulse (!) 58, temperature 98.3 F (36.8 C), temperature source Tympanic, resp. rate 18, SpO2 98 %. There is no height or weight on file to calculate BMI.  Treatment Plan Summary: Daily contact with patient to assess and evaluate symptoms and progress in treatment and Medication management   Patient started on Effexor 75 mg daily for mood symptoms.  Denies  side effects.  Admission labs reviewed, unremarkable.  Patient is interested in shelter resources, LCSW to provide.  LCSW states the patient will not be able to get evening CD IOP but that she is looking into other possibilities for the patient given that he has to work in the daytime.  Can obtain individual counseling for the patient at the very least.  Patient will need a work note for days of work missed.   Corky Sox, MD 01/22/2022 9:51 AM

## 2022-01-22 NOTE — ED Notes (Signed)
Patient A&Ox4. Patient denies SI/HI and AVH.Patient denies any physical complaints when asked. No acute distress noted. Support and encouragement provided. Routine safety checks conducted according to facility protocol. Encouraged patient to notify staff if thoughts of harm toward self or others arise. Patient verbalize understanding and agreement. Will continue to monitor for safety. Q 15 in safety checks remain in place.

## 2022-01-22 NOTE — Progress Notes (Signed)
Group Process Note  LCSW facilitated group process session using audio and visual means to discuss regulation of the autonomic nervous system using a breathing technique.  Participants in group shared experiences with techniques that worked best for them when faced with excess stress.    Pt presented willingly and on time for process group.  Pt presented as being in a neutral mood with a calm affect.  His interpersonal interactions with peers and staff appeared to be appropriate and pro-social.  Pt appeared to be attentive and contributed to group discussion with relevant information. Pt appeared to engage with group as he shared his experiences in dealing with stress by getting angry and procrastinating.  Pt was able to identify and process barriers leading into procrastination.  Pt appeared to be receptive in practicing a new breathing technique along with feedback regarding his barriers.   Omelia Blackwater, MSW, [place] [facility]UC/FBC [phone] phone [phone] work mobile

## 2022-01-22 NOTE — ED Notes (Signed)
Patient in dayroom eating lunch. Respirations equal and unlabored, skin warm and dry, NAD. No change in assessment or acuity. Q 15 minute safety checks remain in place.   

## 2022-01-22 NOTE — ED Notes (Signed)
Pt asleep in bed. Respirations even and unlabored. Monitoring for safety. 

## 2022-01-22 NOTE — ED Notes (Signed)
Pt is in the bed sleeping. Respirations are even and unlabored. No acute distress noted. Will continue to monitor for safety. 

## 2022-01-22 NOTE — ED Notes (Addendum)
Pt states that he needs to leave between 6-6:30am to be to work on time. Pt also requesting note to return to work and a bus pass. Bill Salinas, NP notified.

## 2022-01-22 NOTE — ED Notes (Signed)
Patient in dayroom watching TV with his peers appropriately. Respirations equal and unlabored, skin warm and dry, NAD. No change in assessment or acuity. Q 15 minute safety checks remain in place.    

## 2022-01-22 NOTE — Discharge Instructions (Addendum)
[facility] [place], [place], [address] [phone] phone   New Patient Assessment/Therapy Walk-Ins:  Monday and Wednesday: 8 am until slots are full. Every 1st and 2nd Fridays of the month: 1 pm - 5 pm.  NO ASSESSMENT/THERAPY WALK-INS ON [place]  New Patient Assessment/Medication Management Walk-Ins:  Monday - Friday:  8 am - 11 am.  For all walk-ins, we ask that you arrive by 7:30 am because patients will be seen in the order of arrival.  Availability is limited; therefore, you may not be seen on the same day that you walk-in.  Our goal is to serve and meet the needs of our community to the best of our ability.   SUBSTANCE USE TREATMENT for Medicaid and State Funded/IPRS  [facility] ([facility]) [place], [place], [address] [phone] phone NOTE: Serves those who are low-income or have no insurance.  [facility] [address] [place], [place], [address] [phone] phone [phone] fax NOTE: Does have Substance Abuse-Intensive Outpatient Program [facility] [address]) as well as transitional housing if eligible.  [facility] [address] [place], [address] [phone]  [facility] [address] [place], [place], [address] [phone] phone [phone] fax  [place] [address] [place], [place], [address] [phone] phone [phone] fax   Shelters  [facility] [address] [place], [place] [address] [phone] Population served: Males 18+ Documents required: Valid ID & Social Security Banker of [place], [place], [place] [address] [phone] Population Served: Families with children, adult women, and adult men.   CHARITABLE RESIDENTIAL REHABS  [facility] (women only at this campus) [place], [place] [address] [phone]  [facility] (men only at this campus) [place], [place] [address] [phone]  ((These programs listed above have a one-time application fee.))   [facility] [address] [place] [address] [phone]  [place] [address], [place] [address] [phone] II [facility] [address], [place] [address] [phone], [place] [address] [phone]  [facility] (Rehab for men only) [address] [place], [place] [address] [phone]   RESIDENTIAL TREATMENT- PRIVATE PAY:  [facility] [place], [place] [phone] [facility] [place], [place] [phone]  MEDICAL DETOX/RESIDENTIAL TREATMENT- MEDICAID/IPRS:  [facility] [address] [place], [place] [address] [phone]  [place] [address] [place], [place] [address] [phone]  [place] [address] [place], [place] [address] [phone]  [place] [address] [place], [place] [address] [phone]  [facility] [place], [place] [address] [phone]  ((For admissions to these three [facility] facilities during weekday days and possibly other times, contact [place], phone: [phone]; fax: [phone]))  [facility] (detox for men and women) [place], [place] [address] [phone]  MEDICAL DETOX AND RESIDENTIAL TREATMENT - INSURANCE:  [facility] (also offers CD-IOP for graduates of residential program) [place]. [place],  [address] [phone]  [facility] (now accepting [place]) [place] [address] [phone]  [facility] (accept [employer] for some services)  [address] [place], [place] [address] [phone]  OUTPATIENT PROGRAMS:  [facility] (ADS) [address], [place] [address] [phone]  ((CD-IOP is currently not operational; Opioid replacement clinic is operational))   [facility] (private insurance) [address] [place], [place] [address] [phone]  ((CD-IOP (afternoon program); individual therapy))   [facility] (Medicaid & IPRS for [facility] residents) [address] [place], [place] [address] [phone]  ((CD-IOP; new clients must go through walk-in clinic))   [facility] and safety inspector (Medicaid & IPRS for [facility] residents) [address] [address] [place], [place] [address] [phone]  ((CD-IOP))   [facility] (Medicaid for [employer] and [employer]; some private insurance) [address] [place], [place] [address] [phone]  ((CD-IOP))   [facility] & private insurance) [phone] [address] [place], [place] [address] [phone]  ((CD-IOP (morning & evening programs); individual therapy))   HALFWAY HOUSES:  [facility] [phone]  [email or web address]   OPIOID REPLACEMENT PROVIDERS:  [facility] (ADS) [address], [place] [address] [phone]  [facility] [address] [place], [place] [address] [phone]  [facility] [address] [place], [place] [address] [phone]   12 STEP PROGRAMS:  Alcoholics Anonymous of [place] [employer]  Narcotics Anonymous of [place] [employer]ct.dk  Al-Anon of  [place], [place] www.g[place]alanon.org/find-meetings.html  Nar-Anon https://nar-anon.org/find-a-meetin

## 2022-01-22 NOTE — ED Notes (Signed)
Pt sitting in dining room watching TV. A&O x4, calm and cooperative. Denies current SI/HI/AVH. No signs of distress noted. Monitoring for safety.  

## 2022-01-22 NOTE — ED Notes (Signed)
Patient resting quietly in bed with eyes closed. Respirations equal and unlabored, skin warm and dry, NAD. No change in assessment or acuity. Q 15 minute safety checks remain in place.   

## 2022-01-23 DIAGNOSIS — F1994 Other psychoactive substance use, unspecified with psychoactive substance-induced mood disorder: Secondary | ICD-10-CM | POA: Diagnosis not present

## 2022-01-23 MED ORDER — VENLAFAXINE HCL ER 75 MG PO CP24: 75.0000 mg | ORAL_CAPSULE | Freq: Every day | ORAL | 0 refills | Status: AC

## 2022-01-23 NOTE — ED Notes (Signed)
Patient A&O x 4, ambulatory. Patient discharged in no acute distress. Patient denied SI/HI, A/VH upon discharge. Patient verbalized understanding of all discharge instructions explained by staff, to include follow up appointments, RX's and safety plan. Patient reported mood 10/10.  Pt belongings returned to patient from locker #  27 intact. Patient escorted to lobby via staff for transport to destination. Safety maintained.  

## 2022-01-23 NOTE — ED Notes (Signed)
Pt asleep in bed. Respirations even and unlabored. Monitoring for safety. 

## 2022-01-23 NOTE — ED Notes (Signed)
Patient A&Ox4. Denies intent to harm self/others when asked. Denies A/VH. Patient denies any physical complaints when asked. No acute distress noted. Support and encouragement provided. Routine safety checks conducted according to facility protocol. Encouraged patient to notify staff if thoughts of harm toward self or others arise. Patient verbalize understanding and agreement. Will continue to monitor for safety.    

## 2022-01-23 NOTE — ED Provider Notes (Signed)
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  The patient presented for care requesting help with depression and use of crack cocaine.  Stay Summary:  The patient's detox was uneventful, with the patient experiencing no withdrawal symptoms.  The patient was started on Effexor 75 mg daily for mood symptoms.  He tolerated this medication well.  The patient attended group therapy sessions and was noted to have concrete goals for the future.  The patient is currently employed and was excited at the opportunity to go back to work.  He requested outpatient resources and was given follow-up appointments for therapy and medication management.  Total Time spent with patient: 15 minutes  Past Psychiatric History: as above Past Medical History:  Past Medical History:  Diagnosis Date   Asthma    Stroke [facility])     Past Surgical History:  Procedure Laterality Date   CARDIAC CATHETERIZATION     Family History: No family history on file. Family Psychiatric History: per H and P Social History:  Social History   Substance and Sexual Activity  Alcohol Use Yes     Social History   Substance and Sexual Activity  Drug Use Yes   Types: Marijuana    Social History   Socioeconomic History   Marital status: Divorced    Spouse name: Not on file   Number of children: Not on file   Years of education: Not on file   Highest education level: Not on file  Occupational History   Not on file  Tobacco Use   Smoking status: Every Day    Types: Cigarettes   Smokeless tobacco: Never  Substance and Sexual Activity   Alcohol  use: Yes   Drug use: Yes    Types: Marijuana   Sexual activity: Not on file  Other Topics Concern   Not on file  Social History Narrative   Not on file   Social Determinants of Health   Financial Resource Strain: Not on file  Food Insecurity: Not on file  Transportation Needs: Not on file  Physical Activity: Not on file  Stress: Not on file  Social Connections: Not on file   SDOH:  SDOH Screenings   Depression (PHQ2-9): High Risk (01/21/2022)  Tobacco Use: High Risk (01/19/2022)    Tobacco Cessation:  N/A, patient does not currently use tobacco products  Current Medications:  No current facility-administered medications for this encounter.   Current Outpatient Medications  Medication Sig Dispense Refill   acetaminophen (TYLENOL) 500 MG tablet Take 1,000 mg by mouth daily as needed for mild pain or headache.     albuterol (VENTOLIN HFA) 108 (90 Base) MCG/ACT inhaler Inhale 2 puffs into the lungs every 4 (four) hours as needed for wheezing or shortness of breath.     sildenafil (VIAGRA) 100 MG tablet Take 50-100 mg by mouth daily as needed for erectile dysfunction.     venlafaxine XR (EFFEXOR-XR) 75 MG 24 hr capsule Take 1 capsule (75 mg total) by mouth daily. 30 capsule 0    PTA Medications: (Not in a hospital admission)      01/21/2022  4:22 PM 01/21/2022    1:04 AM 01/21/2022   12:57 AM  Depression screen PHQ 2/9  Decreased Interest 3  1  Down, Depressed, Hopeless 3  2  PHQ - 2 Score 6  3  Altered sleeping 2  0  Tired, decreased energy 3  1  Change in appetite 1  0  Feeling bad or failure about yourself  2  2  Trouble concentrating 2  1  Moving slowly or fidgety/restless 1  0  Suicidal thoughts 1  1  PHQ-9 Score 18  8  Difficult doing work/chores Very difficult Very difficult     Flowsheet Row ED from 01/21/2022 in [facility] ED from 01/19/2022 in [facility] ED from 12/22/2021 in [place] CATEGORY Moderate Risk High Risk Moderate Risk       Musculoskeletal  Strength & Muscle Tone: within normal limits Gait & Station: normal Patient leans: N/A  Psychiatric Specialty Exam  Presentation  General Appearance:  Appropriate for Environment  Eye Contact: Good  Speech: Clear and Coherent  Speech Volume: Normal  Handedness: Right   Mood and Affect  Mood: Depressed  Affect: Congruent   Thought Process  Thought Processes: Coherent  Descriptions of Associations:Intact  Orientation:Full (Time, Place and Person)  Thought Content:WDL  Diagnosis of Schizophrenia or Schizoaffective disorder in past: No    Hallucinations: none Ideas of Reference:None  Suicidal Thoughts: none Homicidal Thoughts: none  Sensorium  Memory: Immediate Good; Remote Good; Recent Good  Judgment: Fair  Insight: Good   Executive Functions  Concentration: Good  Attention Span: Good  Recall: [place] of Knowledge: Good  Language: Good   Psychomotor Activity  Psychomotor Activity:No data recorded  Assets  Assets: Communication Skills; Desire for Improvement; Housing; Transportation; Vocational/Educational   Sleep  Sleep: fair   Physical Exam  Physical Exam Constitutional:      Appearance: the patient is not toxic-appearing.  Pulmonary:     Effort: Pulmonary effort is normal.  Neurological:     General: No focal deficit present.     Mental Status: the patient is alert and oriented to person, place, and time.   Review of Systems  Respiratory:  Negative for shortness of breath.   Cardiovascular:  Negative for chest pain.  Gastrointestinal:  Negative for abdominal pain, constipation, diarrhea, nausea and vomiting.  Neurological:  Negative for headaches.   Blood pressure 124/80, pulse 86, temperature 98.1 F (36.7 C), temperature source Tympanic, resp. rate 18, SpO2 98 %. There is no height  or weight on file to calculate BMI.  Demographic Factors:  Male and Low socioeconomic status  Loss Factors: NA  Historical Factors: NA  Risk Reduction Factors:   Employed, Positive therapeutic relationship, and Positive coping skills or problem solving skills  Continued Clinical Symptoms:  Alcohol/Substance Abuse/Dependencies  Cognitive Features That Contribute To Risk:  None    Suicide Risk:  Mild: Patient presented with some identifiable suicidal ideation..  He has demonstrated good behavioral and emotional regulation on the unit and has demonstrated that he is future oriented.  He is consistently denied suicidal thoughts and is gainfully employed and has stable housing.  Plan Of Care/Follow-up recommendations:  Activity: as tolerated  Diet: heart healthy  Other: -Follow-up with your outpatient psychiatric provider - information provided in the discharge instructions.   -Take your psychiatric medications as prescribed at discharge - instructions are provided to you in the discharge  paperwork.  -Follow-up with outpatient primary care doctor and other specialists -for management of preventative medicine and chronic medical disease.   -Recommend abstinence from alcohol, tobacco, and other illicit drug use at discharge.   -If your psychiatric symptoms recur, worsen, or if you have side effects to your psychiatric medications, call your outpatient psychiatric provider, 911, [phone] or go to the nearest emergency department.   Disposition: self care  Carlyn Reichert, MD 01/23/2022, 3:14 PM

## 2022-01-30 ENCOUNTER — Ambulatory Visit (HOSPITAL_COMMUNITY): Payer: No Payment, Other | Admitting: Licensed Clinical Social Worker

## 2022-02-03 NOTE — Progress Notes (Signed)
Thank you for attending your appointment today.  -- *** -- Continue other medications as prescribed.  Please do not make any changes to medications without first discussing with your provider. If you are experiencing a psychiatric emergency, please call 911 or present to your nearest emergency department. Additional crisis, medication management, and therapy resources are included below.  [facility]  [address], [place], [place] [address] [phone] WALK-IN URGENT CARE 24/7 FOR ANYONE [address], [place], [place]  [phone] Fax: [phone] [email or web address] *Interpreters available *Accepts all insurance and uninsured for Urgent Care needs *Accepts Medicaid and uninsured for outpatient treatment (below)      ONLY FOR [place] Residents  Below:    Outpatient New Patient Assessment/Therapy Walk-ins:        Monday -Thursday 8am until slots are full.        Every Friday 1pm-4pm  (first come, first served)                   New Patient Psychiatry/Medication Management        Monday-Friday 8am-11am (first come, first served)               For all walk-ins we ask that you arrive by 7:15am, because patients will be seen in the order of arrival.   

## 2022-02-03 NOTE — Progress Notes (Unsigned)
Psychiatric Initial Adult Assessment  Patient Identification: Casey Morris MRN:  [id number] Date of Evaluation:  02/04/2022 Referral Source: ASAP discharge  Assessment:  Casey Morris is a 56 y.o. 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 ***.  Patient reports ***  Plan:  # Substance induced mood disorder Past medication trials:  Status of problem: *** Interventions: -- *** -- Effexor XR 75 mg daily  # Cocaine use disorder Past medication trials:  Status of problem: *** Interventions: -- ***  # *** Past medication trials:  Status of problem: *** Interventions: -- ***  Patient was given contact information for behavioral health clinic and was instructed to call 911 for emergencies.   Subjective:  Chief Complaint: No chief complaint on file.   History of Present Illness:  ***   Current meds: Effexor XR 75 mg daily Therapy? IOP? Last cocaine use Work SI  Past Psychiatric History:  Diagnoses: cocaine use disorder, substance induced mood disorder Medication trials: *** Previous psychiatrist/therapist: *** Hospitalizations: *** Suicide attempts: *** SIB: *** Hx of violence towards others: *** Current access to guns: *** Hx of abuse: ***  Previous Psychotropic Medications: {YES/NO:21197}  Substance Abuse History in the last 12 months:  Yes.    -- Cocaine: last used ***  -- ***  -- EtOH: ***  -- Tobacco:   Past Medical History:  Past Medical History:  Diagnosis Date   Asthma    Stroke [facility])     Past Surgical History:  Procedure Laterality Date   CARDIAC CATHETERIZATION      Family Psychiatric History: ***  Family History: No family history on file.  Social  History:   Social History   Socioeconomic History   Marital status: Divorced    Spouse name: Not on file   Number of children: Not on file   Years of education: Not on file   Highest education level: Not on file  Occupational History   Not on file  Tobacco Use   Smoking status: Every Day    Types: Cigarettes   Smokeless tobacco: Never  Substance and Sexual Activity   Alcohol use: Yes   Drug use: Yes    Types: Marijuana   Sexual activity: Not on file  Other Topics Concern   Not on file  Social History Narrative   Not on file   Social Determinants of Health   Financial Resource Strain: Not on file  Food Insecurity: Not on file  Transportation Needs: Not on file  Physical Activity: Not on file  Stress: Not on file  Social Connections: Not on file    Additional Social History: ***  Allergies:   Allergies  Allergen Reactions   Iodides Hives   Iodinated Contrast Media Hives    Current Medications: Current Outpatient Medications  Medication Sig Dispense Refill   acetaminophen (TYLENOL) 500 MG tablet Take 1,000 mg by mouth daily as needed for mild pain or headache.     albuterol (VENTOLIN HFA) 108 (90 Base) MCG/ACT inhaler Inhale 2 puffs into the lungs every 4 (four) hours as needed for wheezing or shortness of breath.     sildenafil (VIAGRA) 100 MG tablet Take 50-100 mg by mouth daily as needed for erectile dysfunction.     venlafaxine XR (EFFEXOR-XR) 75 MG 24 hr capsule Take 1  capsule (75 mg total) by mouth daily. 30 capsule 0   No current facility-administered medications for this visit.    ROS: Review of Systems  Objective:  Psychiatric Specialty Exam: There were no vitals taken for this visit.There is no height or weight on file to calculate BMI.  General Appearance: {Appearance:22683}  Eye Contact:  {BHH EYE CONTACT:22684}  Speech:  {Speech:22685}  Volume:  {Volume (PAA):22686}  Mood:  {BHH MOOD:22306}  Affect:  {Affect (PAA):22687}  Thought Content:  {Thought Content:22690}   Suicidal Thoughts:  {ST/HT (PAA):22692}  Homicidal Thoughts:  {ST/HT (PAA):22692}  Thought Process:  {Thought Process (PAA):22688}  Orientation:  {BHH ORIENTATION (PAA):22689}    Memory:  {BHH MEMORY:22881}  Judgment:  {Judgement (PAA):22694}  Insight:  {Insight (PAA):22695}  Concentration:  {Concentration:21399}  Recall:  {BHH GOOD/FAIR/POOR:22877}  Fund of Knowledge: {BHH GOOD/FAIR/POOR:22877}  Language: {BHH GOOD/FAIR/POOR:22877}  Psychomotor Activity:  {Psychomotor (PAA):22696}  Akathisia:  {BHH YES OR NO:22294}  AIMS (if indicated): {Desc; done/not:10129}  Assets:  {Assets (PAA):22698}  ADL's:  {BHH TW:9249394  Cognition: {chl bhh cognition:304700322}  Sleep:  {BHH GOOD/FAIR/POOR:22877}   PE: General: sits comfortably in view of camera; no acute distress *** Pulm: no increased work of breathing on room air *** MSK: all extremity movements appear intact *** Neuro: no focal neurological deficits observed *** Gait & Station: unable to assess by video ***   Metabolic Disorder Labs: Lab Results  Component Value Date   HGBA1C 5.8 (H) 02/19/2021   MPG 119.76 02/19/2021   No results found for: "PROLACTIN" Lab Results  Component Value Date   CHOL 142 02/19/2021   TRIG 86 02/19/2021   HDL 38 (L) 02/19/2021   CHOLHDL 3.7 02/19/2021   VLDL 17 02/19/2021   LDLCALC 87 02/19/2021   Lab Results  Component Value Date   TSH 2.737 02/19/2021    Therapeutic Level Labs: No results found for: "LITHIUM" No results found for: "CBMZ" No results found for: "VALPROATE"  Screenings:  PHQ2-9    Flowsheet Row ED from 01/21/2022 in [facility] ED from 12/23/2021 in [facility]  PHQ-2 Total Score 3 1  PHQ-9 Total Score 9 2      Flowsheet Row ED from 01/21/2022 in [facility] ED from 01/19/2022 in [place] ED from 12/22/2021 in [place] CATEGORY Moderate Risk High Risk Moderate Risk       Collaboration of Care: Collaboration of Care: {BH OP Collaboration of Care:21014065}  Patient/Guardian was advised Release of Information must be obtained prior to any record release in order to collaborate their care with an outside provider. Patient/Guardian was advised if they have not already done so to contact the registration department to sign all necessary forms in order for [place] to release information regarding their care.   Consent: Patient/Guardian gives verbal consent for treatment and assignment of benefits for services provided during this visit. Patient/Guardian expressed understanding and agreed to proceed.   Televisit via video: I connected with Casey Morris on 02/04/22 at 10:00 AM EDT by a video enabled telemedicine application and verified that I am speaking with the correct person using two identifiers.  Location: Patient: *** Provider: remote office in    I discussed the limitations of evaluation and management by telemedicine and the availability of in person appointments. The patient expressed understanding and agreed to proceed.  I discussed the assessment and treatment plan with the patient.  The patient was provided an opportunity to ask questions and all were answered. The patient agreed with the plan and demonstrated an understanding of the instructions.   The patient was advised to call back or seek an in-person evaluation if the symptoms worsen or if the condition fails to improve as anticipated.  I provided *** minutes of non-face-to-face time during this encounter.  [place] A Kayleen Alig 10/23/202310:03 AM

## 2022-02-04 ENCOUNTER — Encounter (HOSPITAL_COMMUNITY): Payer: Self-pay

## 2022-02-04 ENCOUNTER — Encounter (HOSPITAL_COMMUNITY): Payer: No Payment, Other | Admitting: Psychiatry

## 2022-02-05 NOTE — Progress Notes (Signed)
This encounter was created in error - please disregard.

## 2022-04-03 ENCOUNTER — Encounter: Payer: Self-pay | Admitting: *Deleted

## 2022-04-03 NOTE — Congregational Nurse Program (Signed)
  Dept: [phone]   Congregational Nurse Program Note  Date of Encounter: 04/03/2022  Past Medical History: Past Medical History:  Diagnosis Date   Asthma    Stroke [facility])     Encounter Details:  CNP Questionnaire - 04/03/22 1300       Questionnaire   Ask client: Do you give verbal consent for me to treat you today? Yes    Student Assistance N/A    Location Patient Served  [facility]    Visit Setting with Client Organization    Patient Status Unhoused    Insurance Medicaid    Insurance/Financial Assistance Referral N/A    Medication N/A    Medical Provider No    Screening Referrals Made N/A    Medical Referrals Made [facility]    Medical Appointment Made [facility] P[facility]    Recently w/o PCP, now 1st time PCP visit completed due to CNs referral or appointment made N/A    Food N/A    Transportation N/A    Housing/Utilities No permanent housing    Interpersonal Safety N/A    Interventions Advocate/Support;Navigate Healthcare System    Abnormal to Normal Screening Since Last CN Visit N/A    Screenings CN Performed N/A    Sent Client to Lab for: N/A    Did client attend any of the following based off CNs referral or appointments made? N/A    ED Visit Averted N/A    Life-Saving Intervention Made N/A            Client came to nurse's office requesting help getting a PCP. Contacted [facility] and made an appt with Dr. Jonah Blue March 26th at 8:50. Cutter Passey W RN CN

## 2022-07-09 ENCOUNTER — Ambulatory Visit: Payer: Medicaid Other | Attending: Internal Medicine | Admitting: Internal Medicine

## 2023-10-16 IMAGING — CR DG CHEST 2V
2 series · 2 of 2 positions shown · non-contrast
Comparison: None.

CLINICAL DATA: Chest pain and headache.  Blurry vision.

EXAM:
CHEST - 2 VIEW

[w chest pa]
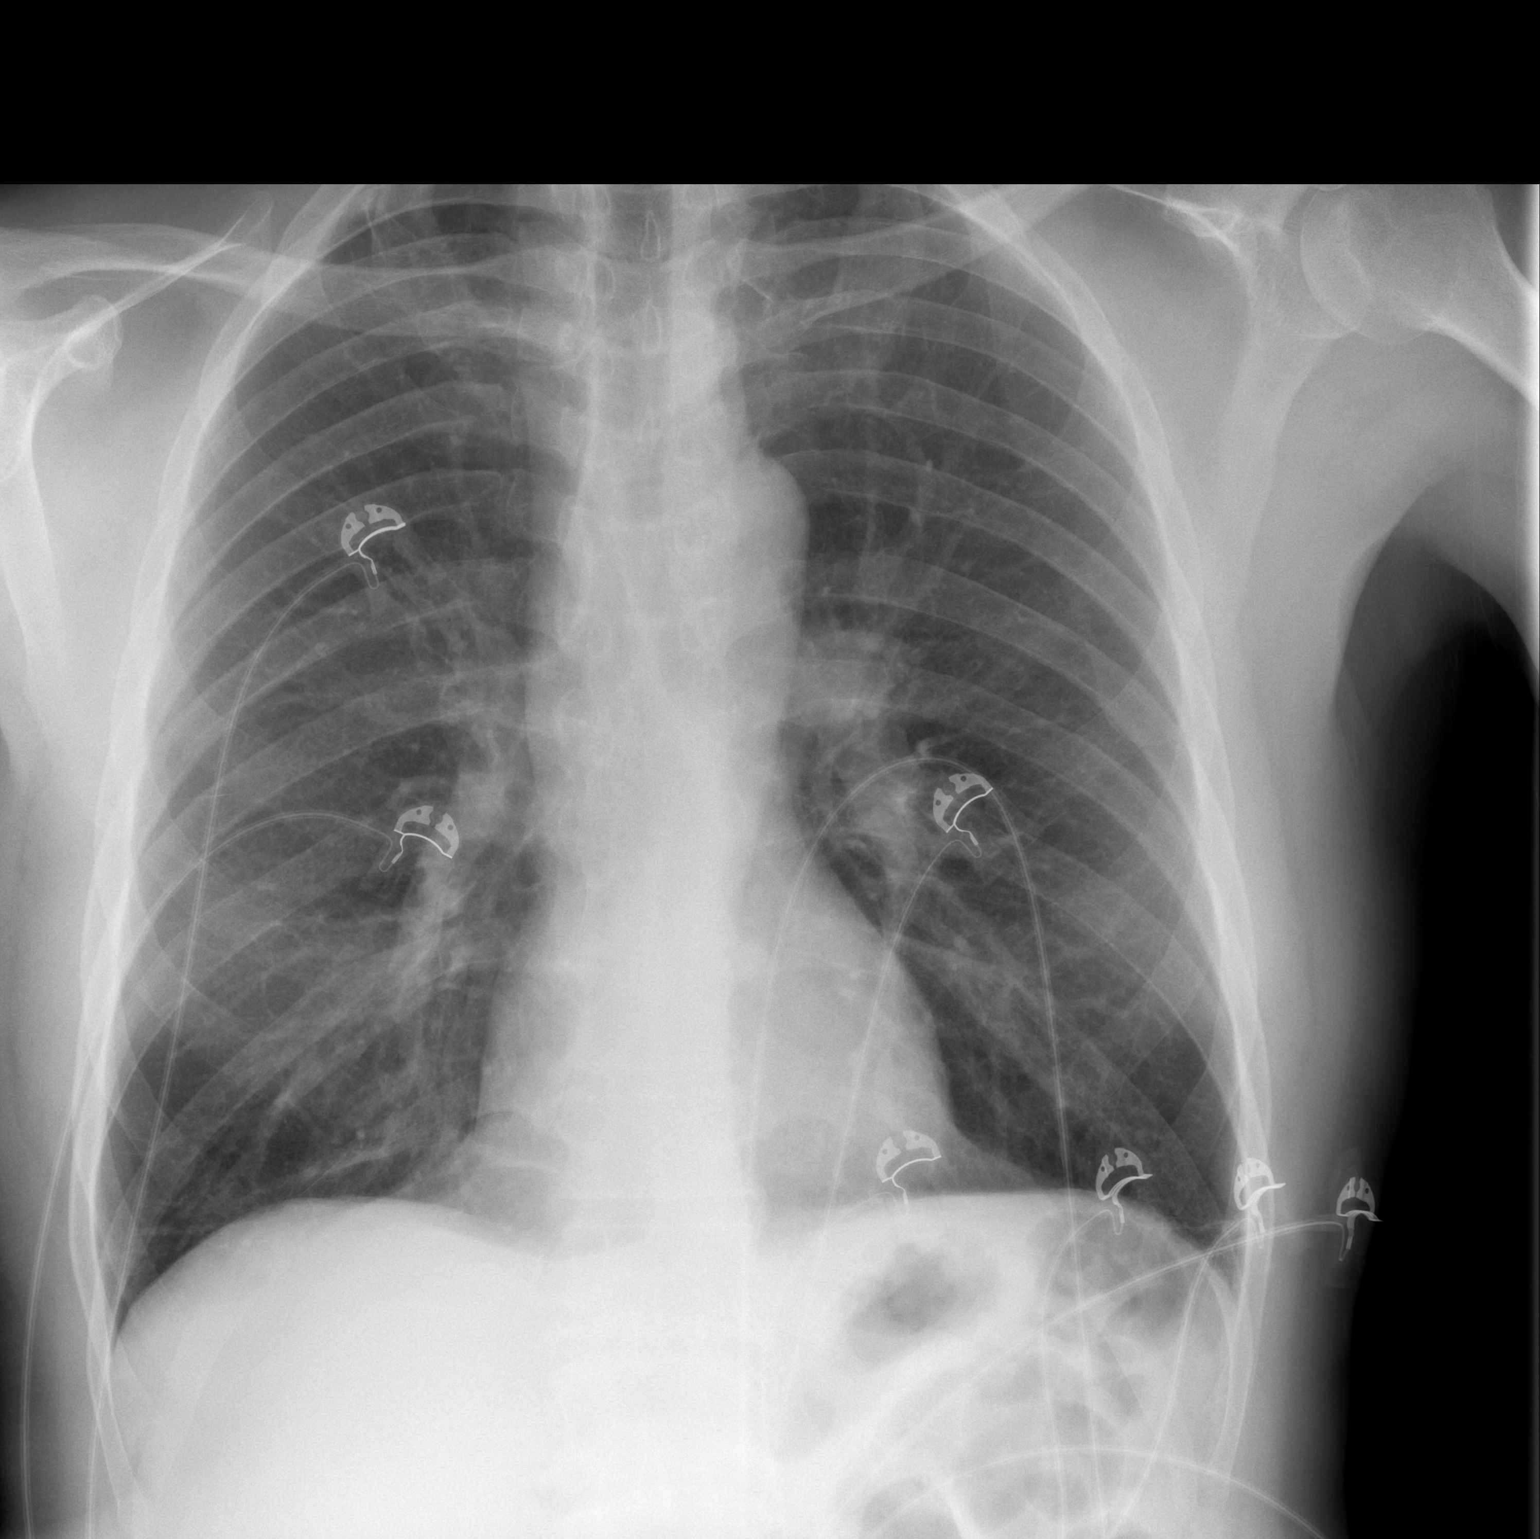

[w chest lat]
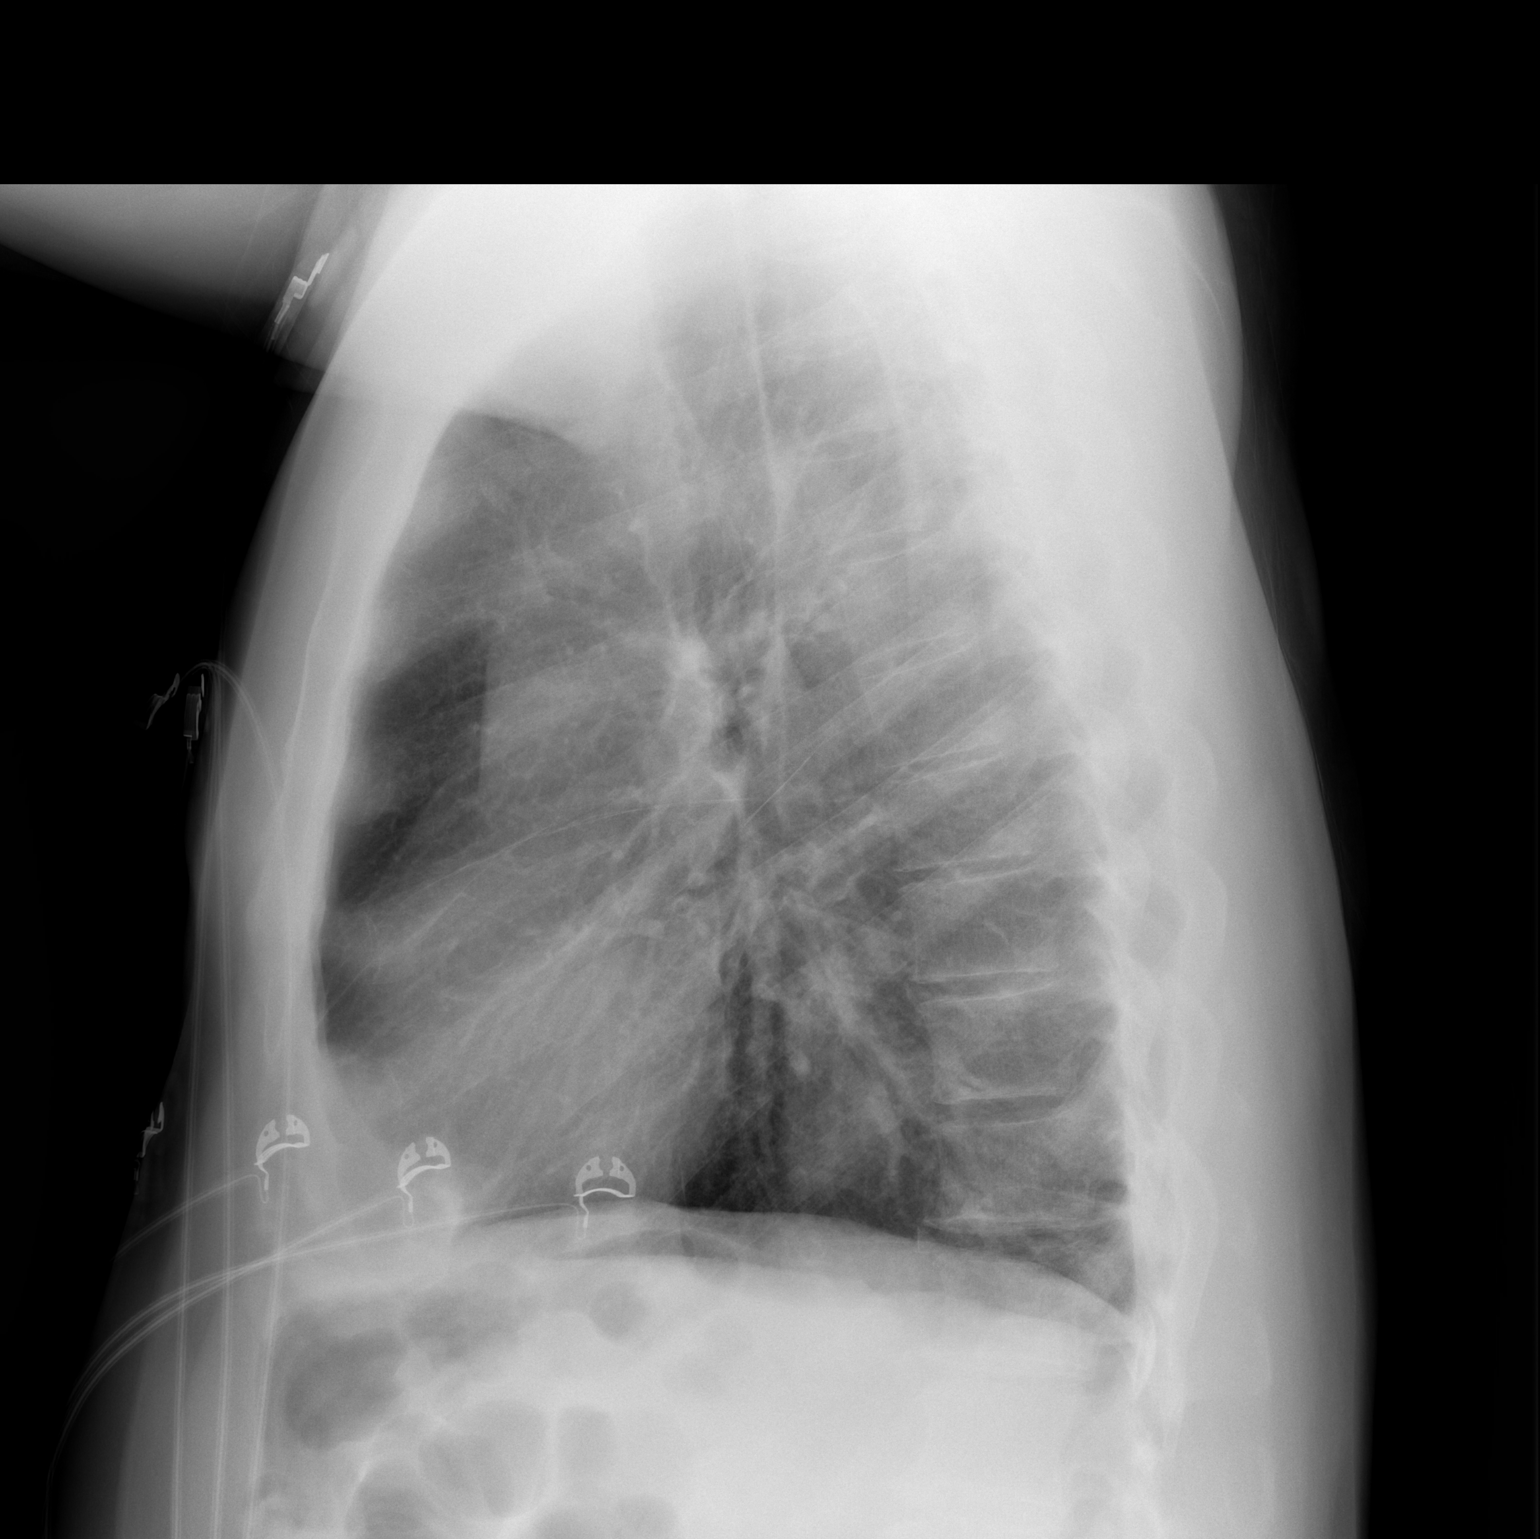

[2 of 2 positions shown; findings below may reference images not displayed]

FINDINGS: The cardiomediastinal contours are normal. There is biapical
pleuroparenchymal scarring and blebs. Mild hyperinflation. Central
bronchial thickening. Pulmonary vasculature is normal. No
consolidation, pleural effusion, or pneumothorax. No acute osseous
abnormalities are seen.
IMPRESSION: Central bronchial thickening and mild hyperinflation suggesting
bronchitis or asthma.

## 2023-10-16 IMAGING — CT CT HEAD W/O CM
3 series · 15 of 47 positions shown, 18 images · non-contrast
Comparison: None.

CLINICAL DATA: Left upper extremity numbness and headache.

EXAM:
CT HEAD WITHOUT CONTRAST
TECHNIQUE: Contiguous axial images were obtained from the base of the skull
through the vertex without intravenous contrast.

[Series 2: head wo · axial · 0.50mm/px · z∈[-216,-56]mm · 9 of 38 slices shown, 12 images]
[im 3/38  brain]
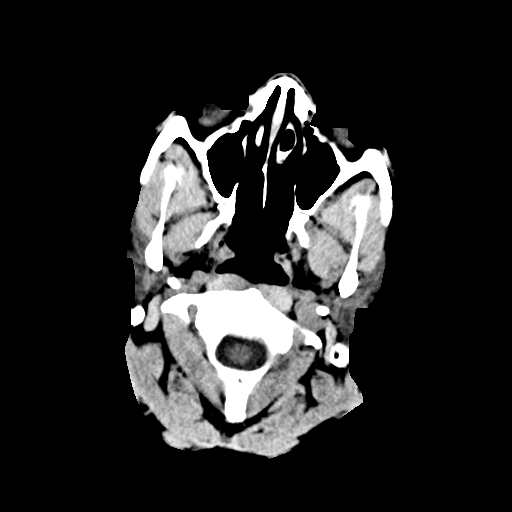
[im 3/38  bone]
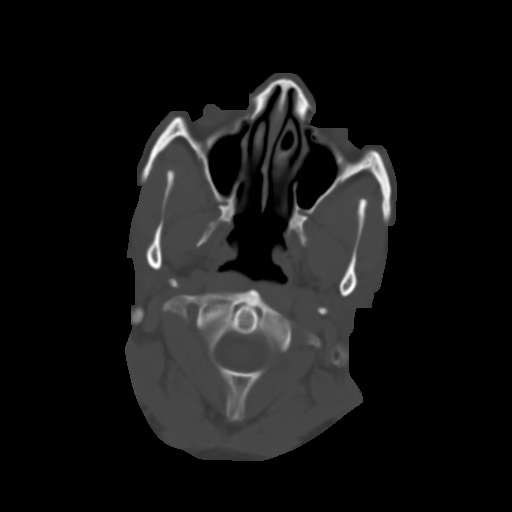
[im 7/38  brain]
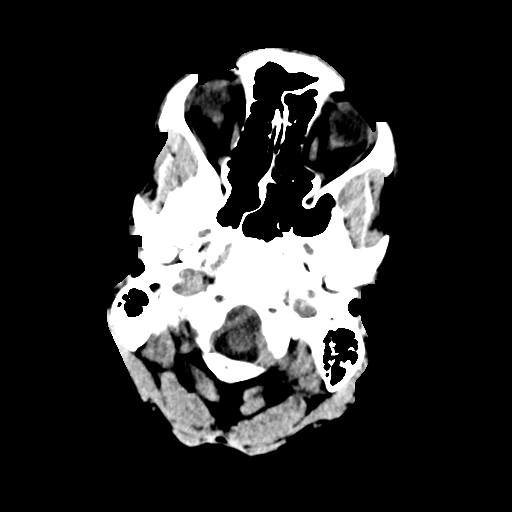
[im 11/38  brain]
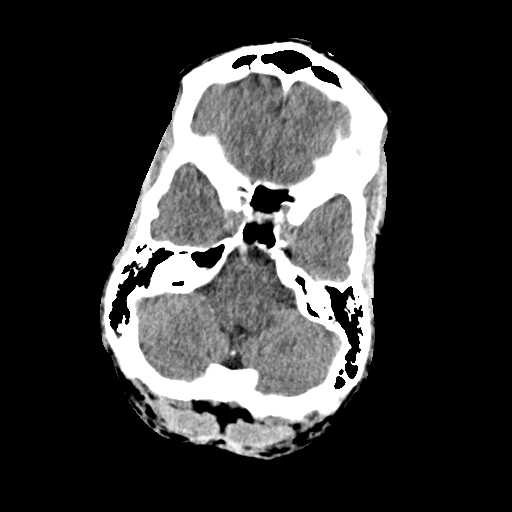
[im 15/38  brain]
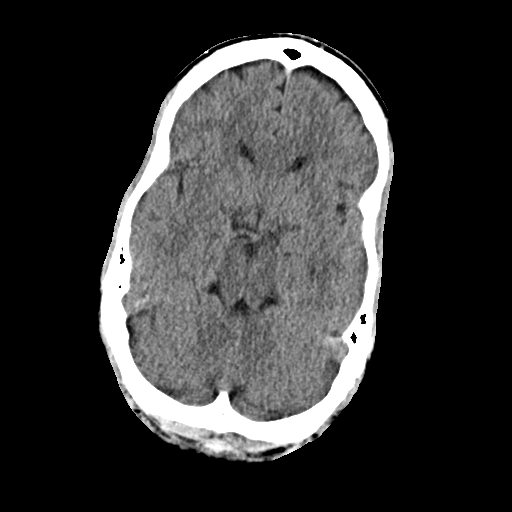
[im 20/38  brain]
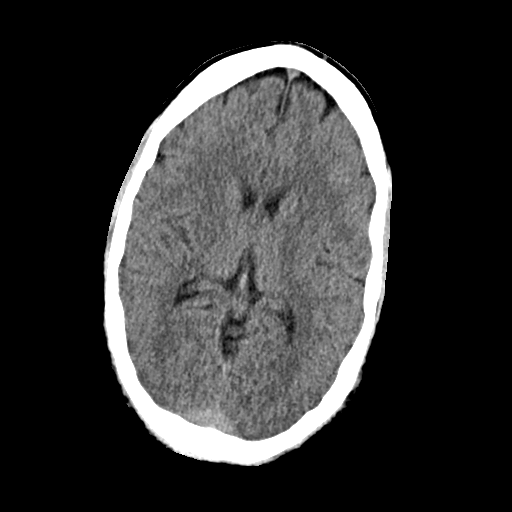
[im 20/38  bone]
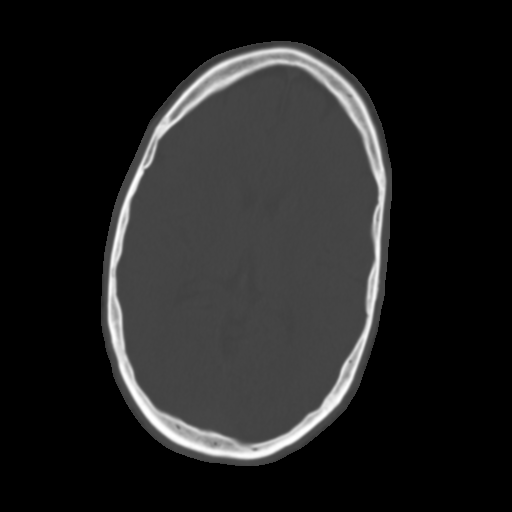
[im 23/38  brain]
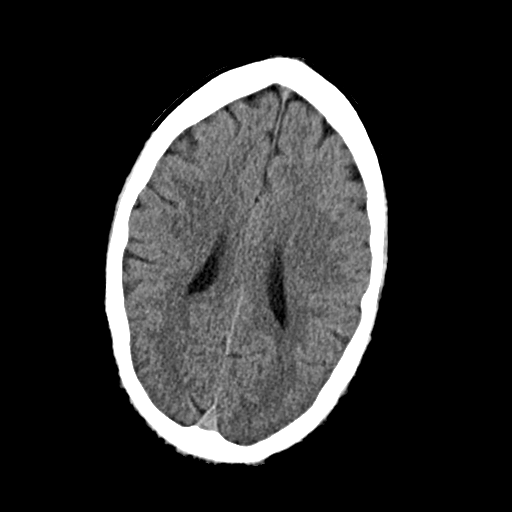
[im 27/38  brain]
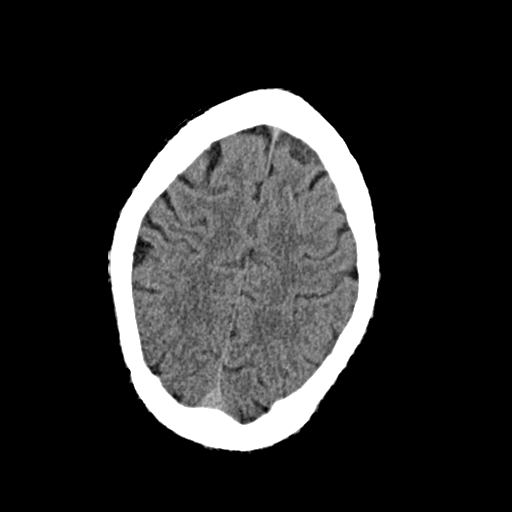
[im 31/38  brain]
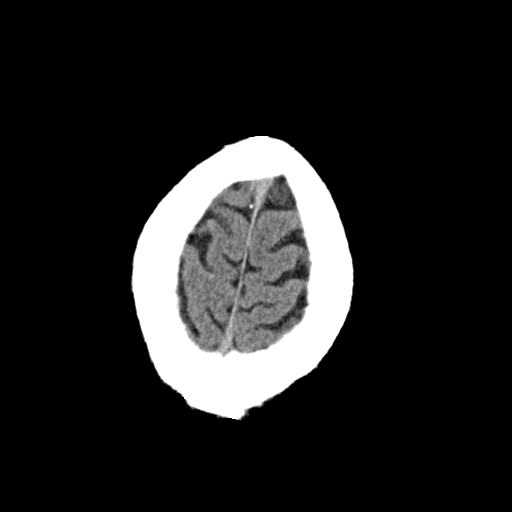
[im 35/38  brain]
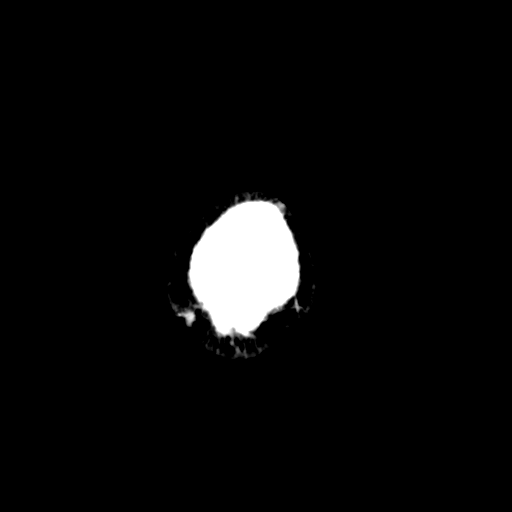
[im 35/38  bone]
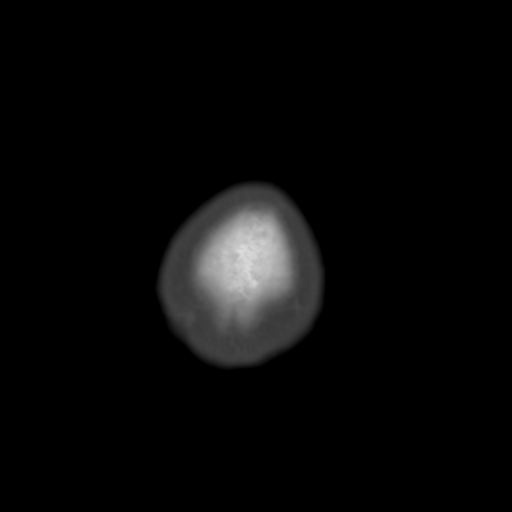

[Series 4: coronal soft · coronal · 0.37mm/px · 3 of 81 slices shown]
[im 27/81  brain]
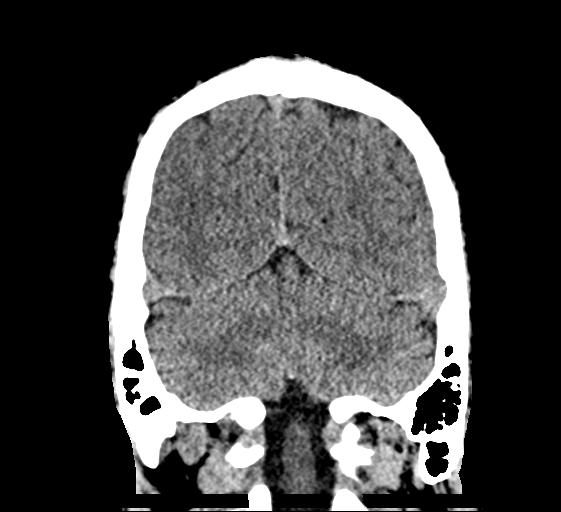
[im 36/81  brain]
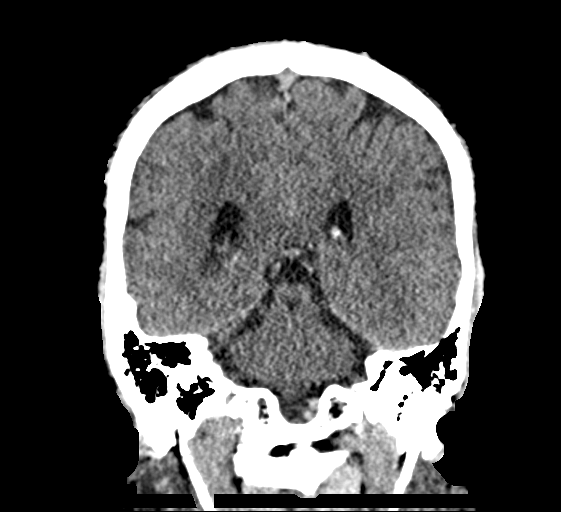
[im 45/81  brain]
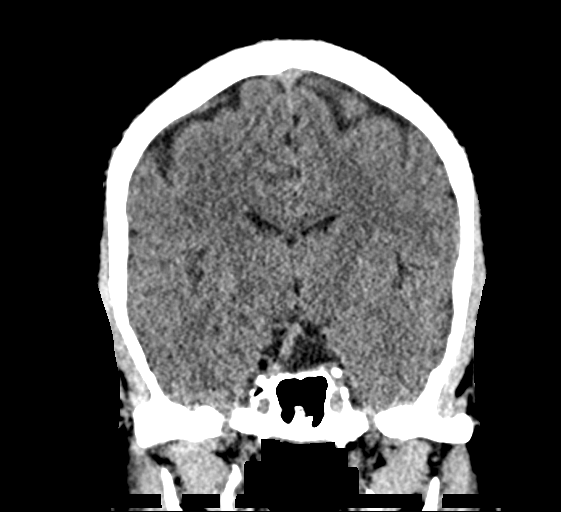

[Series 5: sag soft · sagittal · 0.37mm/px · 3 of 67 slices shown]
[im 23/67  brain]
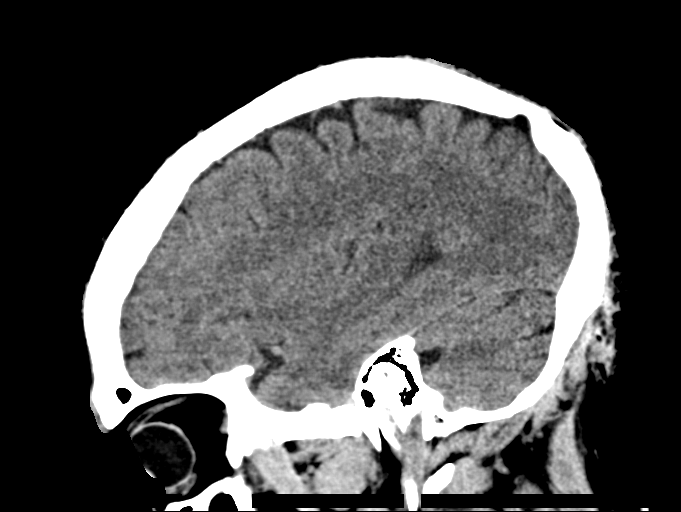
[im 34/67  brain]
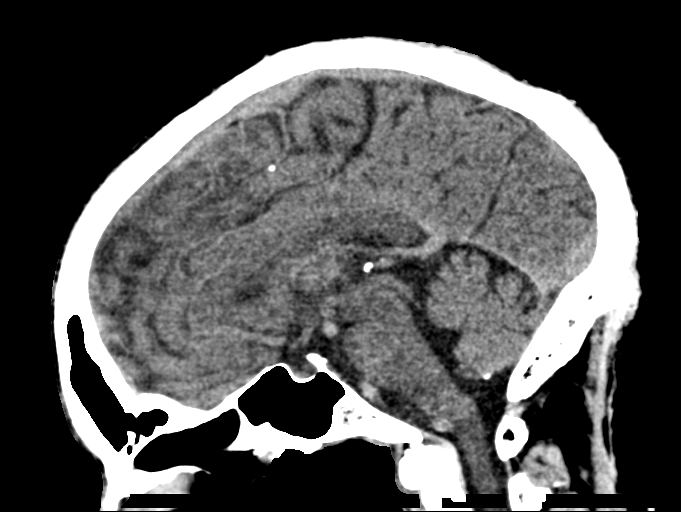
[im 45/67  brain]
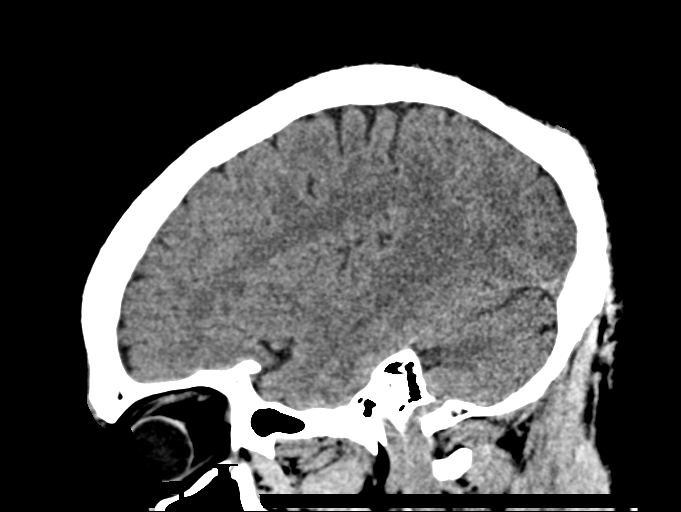

[15 of 47 positions shown; findings below may reference images not displayed]

FINDINGS: Brain: There is a 2.5 x 2.2 x 2.2 cm focal encephalomalacia
consistent with a chronic cortical infarct in the lower lateral
right parietal lobe. There is early cerebral cortical atrophy
without significant findings of small vessel disease. Cerebellum and
brainstem are unremarkable with no focal asymmetry concerning for
acute infarct, hemorrhage or mass. Ventricles are normal in size and
position.

Vascular: There are scattered calcifications in the carotid siphons
but no hyperdense central vessels.

Skull: The calvarium and skull base are intact without focal lesion.

Sinuses/Orbits: There is a chronic depressed fracture of the
posterior aspect of the medial wall left orbit, without extraocular
muscle entrapment. Sinuses and mastoid air cells are clear with
right-sided deviation and spurring of the nasal septum.

Other: There are scattered dural calcifications in the falx. There
cutaneous calcifications in the frontal scalp.
IMPRESSION: Small chronic right parietal lobe infarct. No acute intracranial CT
findings. Obtain MRI if there is concern for occult infarct.
